# Patient Record
Sex: Female | Born: 1935
Health system: Southern US, Community
[De-identification: ages and names within clinical notes are randomized; demographics above are authoritative.]

## PROBLEM LIST (undated history)

## (undated) DIAGNOSIS — G4730 Sleep apnea, unspecified: Secondary | ICD-10-CM

## (undated) DIAGNOSIS — G2581 Restless legs syndrome: Secondary | ICD-10-CM

## (undated) DIAGNOSIS — Z8614 Personal history of Methicillin resistant Staphylococcus aureus infection: Secondary | ICD-10-CM

## (undated) DIAGNOSIS — F329 Major depressive disorder, single episode, unspecified: Secondary | ICD-10-CM

## (undated) DIAGNOSIS — I10 Essential (primary) hypertension: Secondary | ICD-10-CM

## (undated) DIAGNOSIS — K589 Irritable bowel syndrome without diarrhea: Secondary | ICD-10-CM

## (undated) DIAGNOSIS — E039 Hypothyroidism, unspecified: Secondary | ICD-10-CM

## (undated) DIAGNOSIS — B029 Zoster without complications: Secondary | ICD-10-CM

## (undated) DIAGNOSIS — M810 Age-related osteoporosis without current pathological fracture: Secondary | ICD-10-CM

## (undated) DIAGNOSIS — E785 Hyperlipidemia, unspecified: Secondary | ICD-10-CM

## (undated) DIAGNOSIS — M542 Cervicalgia: Secondary | ICD-10-CM

## (undated) DIAGNOSIS — K649 Unspecified hemorrhoids: Secondary | ICD-10-CM

## (undated) DIAGNOSIS — N3941 Urge incontinence: Secondary | ICD-10-CM

## (undated) DIAGNOSIS — F32A Depression, unspecified: Secondary | ICD-10-CM

## (undated) DIAGNOSIS — K5190 Ulcerative colitis, unspecified, without complications: Secondary | ICD-10-CM

## (undated) DIAGNOSIS — M159 Polyosteoarthritis, unspecified: Secondary | ICD-10-CM

## (undated) HISTORY — DX: Ulcerative colitis, unspecified, without complications: K51.90

## (undated) HISTORY — DX: Major depressive disorder, single episode, unspecified: F32.9

## (undated) HISTORY — DX: Urge incontinence: N39.41

## (undated) HISTORY — DX: Sleep apnea, unspecified: G47.30

## (undated) HISTORY — DX: Hyperlipidemia, unspecified: E78.5

## (undated) HISTORY — DX: Zoster without complications: B02.9

## (undated) HISTORY — DX: Cervicalgia: M54.2

## (undated) HISTORY — DX: Essential (primary) hypertension: I10

## (undated) HISTORY — DX: Polyosteoarthritis, unspecified: M15.9

## (undated) HISTORY — PX: OTHER SURGICAL HISTORY: SHX169

## (undated) HISTORY — DX: Depression, unspecified: F32.A

## (undated) HISTORY — DX: Unspecified hemorrhoids: K64.9

## (undated) HISTORY — DX: Personal history of Methicillin resistant Staphylococcus aureus infection: Z86.14

## (undated) HISTORY — DX: Hypothyroidism, unspecified: E03.9

## (undated) HISTORY — DX: Age-related osteoporosis without current pathological fracture: M81.0

## (undated) HISTORY — DX: Irritable bowel syndrome, unspecified: K58.9

## (undated) HISTORY — DX: Restless legs syndrome: G25.81

---
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
## 1978-04-18 HISTORY — PX: ABDOMINAL HYSTERECTOMY: SHX81

## 1982-04-18 HISTORY — PX: OTHER SURGICAL HISTORY: SHX169

## 2004-08-08 ENCOUNTER — Emergency Department: Payer: Self-pay | Admitting: Emergency Medicine

## 2004-08-13 ENCOUNTER — Emergency Department: Payer: Self-pay | Admitting: Emergency Medicine

## 2005-04-28 ENCOUNTER — Ambulatory Visit: Payer: Self-pay | Admitting: Ophthalmology

## 2005-05-03 ENCOUNTER — Ambulatory Visit: Payer: Self-pay | Admitting: Ophthalmology

## 2005-11-29 ENCOUNTER — Ambulatory Visit: Payer: Self-pay | Admitting: Gastroenterology

## 2005-12-26 ENCOUNTER — Ambulatory Visit: Payer: Self-pay | Admitting: Gastroenterology

## 2006-07-28 ENCOUNTER — Ambulatory Visit: Payer: Self-pay | Admitting: Podiatry

## 2008-12-05 ENCOUNTER — Ambulatory Visit: Payer: Self-pay | Admitting: Otolaryngology

## 2009-07-24 ENCOUNTER — Encounter: Payer: Self-pay | Admitting: Otolaryngology

## 2009-08-16 ENCOUNTER — Encounter: Payer: Self-pay | Admitting: Otolaryngology

## 2012-04-18 HISTORY — PX: ESOPHAGOGASTRODUODENOSCOPY: SHX1529

## 2012-06-16 HISTORY — PX: OTHER SURGICAL HISTORY: SHX169

## 2012-06-28 ENCOUNTER — Ambulatory Visit: Payer: Self-pay | Admitting: Unknown Physician Specialty

## 2012-12-17 HISTORY — PX: OTHER SURGICAL HISTORY: SHX169

## 2013-01-09 ENCOUNTER — Ambulatory Visit: Payer: Self-pay | Admitting: Gastroenterology

## 2013-01-10 LAB — PATHOLOGY REPORT

## 2013-03-05 ENCOUNTER — Emergency Department: Payer: Self-pay | Admitting: Emergency Medicine

## 2013-03-05 LAB — COMPREHENSIVE METABOLIC PANEL
Bilirubin,Total: 0.5 mg/dL (ref 0.2–1.0)
Chloride: 106 mmol/L (ref 98–107)
Co2: 26 mmol/L (ref 21–32)
Creatinine: 0.85 mg/dL (ref 0.60–1.30)
EGFR (African American): >60
EGFR (Non-African Amer.): >60
Osmolality: 274 (ref 275–301)
Potassium: 3.9 mmol/L (ref 3.5–5.1)
Sodium: 137 mmol/L (ref 136–145)
Total Protein: 7 g/dL (ref 6.4–8.2)

## 2013-03-05 LAB — CBC
HCT: 38.5 % (ref 35.0–47.0)
HGB: 13 g/dL (ref 12.0–16.0)
MCV: 86 fL (ref 80–100)
Platelet: 162 10*3/uL (ref 150–440)
WBC: 5.9 10*3/uL (ref 3.6–11.0)

## 2013-03-05 LAB — URINALYSIS, COMPLETE
Bilirubin,UR: NEGATIVE
Blood: NEGATIVE
Glucose,UR: NEGATIVE mg/dL (ref 0–75)
Protein: NEGATIVE

## 2013-03-05 LAB — CK TOTAL AND CKMB (NOT AT ARMC): CK-MB: 1.7 ng/mL (ref 0.5–3.6)

## 2013-04-19 DIAGNOSIS — R262 Difficulty in walking, not elsewhere classified: Secondary | ICD-10-CM | POA: Diagnosis not present

## 2013-04-19 DIAGNOSIS — M6281 Muscle weakness (generalized): Secondary | ICD-10-CM | POA: Diagnosis not present

## 2013-06-27 DIAGNOSIS — B009 Herpesviral infection, unspecified: Secondary | ICD-10-CM | POA: Diagnosis not present

## 2013-06-27 DIAGNOSIS — Z85820 Personal history of malignant melanoma of skin: Secondary | ICD-10-CM | POA: Diagnosis not present

## 2013-06-27 DIAGNOSIS — D235 Other benign neoplasm of skin of trunk: Secondary | ICD-10-CM | POA: Diagnosis not present

## 2013-07-11 DIAGNOSIS — S8290XD Unspecified fracture of unspecified lower leg, subsequent encounter for closed fracture with routine healing: Secondary | ICD-10-CM | POA: Diagnosis not present

## 2013-08-02 DIAGNOSIS — H9319 Tinnitus, unspecified ear: Secondary | ICD-10-CM | POA: Diagnosis not present

## 2013-08-02 DIAGNOSIS — J3489 Other specified disorders of nose and nasal sinuses: Secondary | ICD-10-CM | POA: Diagnosis not present

## 2013-08-02 DIAGNOSIS — R42 Dizziness and giddiness: Secondary | ICD-10-CM | POA: Diagnosis not present

## 2013-09-26 DIAGNOSIS — N318 Other neuromuscular dysfunction of bladder: Secondary | ICD-10-CM | POA: Diagnosis not present

## 2013-09-26 DIAGNOSIS — R32 Unspecified urinary incontinence: Secondary | ICD-10-CM | POA: Diagnosis not present

## 2013-10-02 DIAGNOSIS — E785 Hyperlipidemia, unspecified: Secondary | ICD-10-CM | POA: Diagnosis not present

## 2013-10-02 DIAGNOSIS — I10 Essential (primary) hypertension: Secondary | ICD-10-CM | POA: Diagnosis not present

## 2013-10-02 DIAGNOSIS — R42 Dizziness and giddiness: Secondary | ICD-10-CM | POA: Diagnosis not present

## 2013-10-02 DIAGNOSIS — E039 Hypothyroidism, unspecified: Secondary | ICD-10-CM | POA: Diagnosis not present

## 2013-10-04 DIAGNOSIS — I10 Essential (primary) hypertension: Secondary | ICD-10-CM | POA: Diagnosis not present

## 2013-10-04 DIAGNOSIS — E785 Hyperlipidemia, unspecified: Secondary | ICD-10-CM | POA: Diagnosis not present

## 2013-10-04 DIAGNOSIS — E039 Hypothyroidism, unspecified: Secondary | ICD-10-CM | POA: Diagnosis not present

## 2013-12-26 DIAGNOSIS — Z85820 Personal history of malignant melanoma of skin: Secondary | ICD-10-CM | POA: Diagnosis not present

## 2013-12-26 DIAGNOSIS — D235 Other benign neoplasm of skin of trunk: Secondary | ICD-10-CM | POA: Diagnosis not present

## 2013-12-26 DIAGNOSIS — D1801 Hemangioma of skin and subcutaneous tissue: Secondary | ICD-10-CM | POA: Diagnosis not present

## 2014-01-23 DIAGNOSIS — G4733 Obstructive sleep apnea (adult) (pediatric): Secondary | ICD-10-CM | POA: Diagnosis not present

## 2014-01-23 DIAGNOSIS — I10 Essential (primary) hypertension: Secondary | ICD-10-CM | POA: Insufficient documentation

## 2014-01-23 DIAGNOSIS — E782 Mixed hyperlipidemia: Secondary | ICD-10-CM | POA: Insufficient documentation

## 2014-01-23 DIAGNOSIS — G4730 Sleep apnea, unspecified: Secondary | ICD-10-CM | POA: Insufficient documentation

## 2014-01-29 DIAGNOSIS — H5509 Other forms of nystagmus: Secondary | ICD-10-CM | POA: Diagnosis not present

## 2014-02-06 DIAGNOSIS — Z23 Encounter for immunization: Secondary | ICD-10-CM | POA: Diagnosis not present

## 2014-04-02 DIAGNOSIS — R300 Dysuria: Secondary | ICD-10-CM | POA: Diagnosis not present

## 2014-04-02 DIAGNOSIS — E039 Hypothyroidism, unspecified: Secondary | ICD-10-CM | POA: Diagnosis not present

## 2014-04-02 DIAGNOSIS — I10 Essential (primary) hypertension: Secondary | ICD-10-CM | POA: Diagnosis not present

## 2014-04-02 DIAGNOSIS — E785 Hyperlipidemia, unspecified: Secondary | ICD-10-CM | POA: Diagnosis not present

## 2014-04-02 DIAGNOSIS — G2581 Restless legs syndrome: Secondary | ICD-10-CM | POA: Diagnosis not present

## 2014-04-28 DIAGNOSIS — M25551 Pain in right hip: Secondary | ICD-10-CM | POA: Diagnosis not present

## 2014-04-30 DIAGNOSIS — I10 Essential (primary) hypertension: Secondary | ICD-10-CM | POA: Diagnosis not present

## 2014-04-30 DIAGNOSIS — M25551 Pain in right hip: Secondary | ICD-10-CM | POA: Diagnosis not present

## 2014-05-02 DIAGNOSIS — M25551 Pain in right hip: Secondary | ICD-10-CM | POA: Diagnosis not present

## 2014-05-05 DIAGNOSIS — M25551 Pain in right hip: Secondary | ICD-10-CM | POA: Diagnosis not present

## 2014-05-07 DIAGNOSIS — M25551 Pain in right hip: Secondary | ICD-10-CM | POA: Diagnosis not present

## 2014-05-08 DIAGNOSIS — M25551 Pain in right hip: Secondary | ICD-10-CM | POA: Diagnosis not present

## 2014-05-12 DIAGNOSIS — M25551 Pain in right hip: Secondary | ICD-10-CM | POA: Diagnosis not present

## 2014-05-14 DIAGNOSIS — M25551 Pain in right hip: Secondary | ICD-10-CM | POA: Diagnosis not present

## 2014-05-16 DIAGNOSIS — M25551 Pain in right hip: Secondary | ICD-10-CM | POA: Diagnosis not present

## 2014-05-22 DIAGNOSIS — M25551 Pain in right hip: Secondary | ICD-10-CM | POA: Diagnosis not present

## 2014-05-27 DIAGNOSIS — N3941 Urge incontinence: Secondary | ICD-10-CM | POA: Diagnosis not present

## 2014-05-27 DIAGNOSIS — N3281 Overactive bladder: Secondary | ICD-10-CM | POA: Diagnosis not present

## 2014-05-28 DIAGNOSIS — M25551 Pain in right hip: Secondary | ICD-10-CM | POA: Diagnosis not present

## 2014-05-29 DIAGNOSIS — M25551 Pain in right hip: Secondary | ICD-10-CM | POA: Diagnosis not present

## 2014-05-30 DIAGNOSIS — N3941 Urge incontinence: Secondary | ICD-10-CM | POA: Diagnosis not present

## 2014-06-13 DIAGNOSIS — N3941 Urge incontinence: Secondary | ICD-10-CM | POA: Diagnosis not present

## 2014-06-27 DIAGNOSIS — N3941 Urge incontinence: Secondary | ICD-10-CM | POA: Diagnosis not present

## 2014-07-09 DIAGNOSIS — N3941 Urge incontinence: Secondary | ICD-10-CM | POA: Diagnosis not present

## 2014-07-09 DIAGNOSIS — I10 Essential (primary) hypertension: Secondary | ICD-10-CM | POA: Diagnosis not present

## 2014-07-16 DIAGNOSIS — N3941 Urge incontinence: Secondary | ICD-10-CM | POA: Diagnosis not present

## 2014-07-16 DIAGNOSIS — I10 Essential (primary) hypertension: Secondary | ICD-10-CM | POA: Diagnosis not present

## 2014-07-23 DIAGNOSIS — N3941 Urge incontinence: Secondary | ICD-10-CM | POA: Diagnosis not present

## 2014-07-30 DIAGNOSIS — Z85820 Personal history of malignant melanoma of skin: Secondary | ICD-10-CM | POA: Diagnosis not present

## 2014-07-30 DIAGNOSIS — N3941 Urge incontinence: Secondary | ICD-10-CM | POA: Diagnosis not present

## 2014-07-30 DIAGNOSIS — D2262 Melanocytic nevi of left upper limb, including shoulder: Secondary | ICD-10-CM | POA: Diagnosis not present

## 2014-07-30 DIAGNOSIS — D225 Melanocytic nevi of trunk: Secondary | ICD-10-CM | POA: Diagnosis not present

## 2014-07-30 DIAGNOSIS — D2272 Melanocytic nevi of left lower limb, including hip: Secondary | ICD-10-CM | POA: Diagnosis not present

## 2014-08-06 DIAGNOSIS — N3941 Urge incontinence: Secondary | ICD-10-CM | POA: Diagnosis not present

## 2014-08-12 DIAGNOSIS — Z1231 Encounter for screening mammogram for malignant neoplasm of breast: Secondary | ICD-10-CM | POA: Diagnosis not present

## 2014-08-13 DIAGNOSIS — I10 Essential (primary) hypertension: Secondary | ICD-10-CM | POA: Diagnosis not present

## 2014-08-13 DIAGNOSIS — N3941 Urge incontinence: Secondary | ICD-10-CM | POA: Diagnosis not present

## 2014-08-19 DIAGNOSIS — I10 Essential (primary) hypertension: Secondary | ICD-10-CM | POA: Diagnosis not present

## 2014-08-19 DIAGNOSIS — N3941 Urge incontinence: Secondary | ICD-10-CM | POA: Diagnosis not present

## 2014-08-26 DIAGNOSIS — M25551 Pain in right hip: Secondary | ICD-10-CM | POA: Diagnosis not present

## 2014-08-26 DIAGNOSIS — M545 Low back pain, unspecified: Secondary | ICD-10-CM | POA: Insufficient documentation

## 2014-08-26 DIAGNOSIS — M25559 Pain in unspecified hip: Secondary | ICD-10-CM | POA: Insufficient documentation

## 2014-08-27 DIAGNOSIS — N3941 Urge incontinence: Secondary | ICD-10-CM | POA: Diagnosis not present

## 2014-08-27 DIAGNOSIS — I10 Essential (primary) hypertension: Secondary | ICD-10-CM | POA: Diagnosis not present

## 2014-09-05 DIAGNOSIS — R399 Unspecified symptoms and signs involving the genitourinary system: Secondary | ICD-10-CM | POA: Diagnosis not present

## 2014-09-10 ENCOUNTER — Other Ambulatory Visit: Payer: Self-pay | Admitting: Urology

## 2014-09-10 ENCOUNTER — Ambulatory Visit
Admission: RE | Admit: 2014-09-10 | Discharge: 2014-09-10 | Disposition: A | Discharge status: Home / Self Care | Payer: Medicare Other | Source: Ambulatory Visit | Attending: Urology | Admitting: Urology

## 2014-09-10 DIAGNOSIS — N200 Calculus of kidney: Secondary | ICD-10-CM

## 2014-09-10 DIAGNOSIS — R935 Abnormal findings on diagnostic imaging of other abdominal regions, including retroperitoneum: Secondary | ICD-10-CM | POA: Diagnosis not present

## 2014-09-16 ENCOUNTER — Other Ambulatory Visit: Payer: Self-pay | Admitting: Urology

## 2014-09-16 ENCOUNTER — Telehealth: Payer: Self-pay | Admitting: Family Medicine

## 2014-09-16 DIAGNOSIS — N3941 Urge incontinence: Secondary | ICD-10-CM

## 2014-09-16 DIAGNOSIS — N390 Urinary tract infection, site not specified: Secondary | ICD-10-CM

## 2014-09-16 NOTE — Telephone Encounter (Signed)
Pt called and is wanting Romona to call her back

## 2014-09-17 ENCOUNTER — Ambulatory Visit: Payer: Self-pay | Admitting: Urology

## 2014-09-17 NOTE — Telephone Encounter (Signed)
Spoke with patient and I had already spoke to her about this matter in allscripts.

## 2014-09-19 ENCOUNTER — Ambulatory Visit: Payer: Medicare Other | Attending: Urology

## 2014-09-23 ENCOUNTER — Encounter: Payer: Self-pay | Admitting: *Deleted

## 2014-09-24 ENCOUNTER — Ambulatory Visit (INDEPENDENT_AMBULATORY_CARE_PROVIDER_SITE_OTHER): Payer: Medicare Other | Admitting: Urology

## 2014-09-24 ENCOUNTER — Encounter: Payer: Self-pay | Admitting: Urology

## 2014-09-24 VITALS — BP 167/89 | HR 70 | Ht 62.0 in | Wt 160.0 lb

## 2014-09-24 DIAGNOSIS — N390 Urinary tract infection, site not specified: Secondary | ICD-10-CM

## 2014-09-24 DIAGNOSIS — N3941 Urge incontinence: Secondary | ICD-10-CM

## 2014-09-24 DIAGNOSIS — R3129 Other microscopic hematuria: Secondary | ICD-10-CM

## 2014-09-24 DIAGNOSIS — R312 Other microscopic hematuria: Secondary | ICD-10-CM

## 2014-09-24 LAB — URINALYSIS, COMPLETE
Bilirubin, UA: POSITIVE — AB
GLUCOSE, UA: NEGATIVE
KETONES UA: NEGATIVE
LEUKOCYTES UA: NEGATIVE
Nitrite, UA: NEGATIVE
PH UA: 7 (ref 5.0–7.5)
Protein, UA: NEGATIVE
RBC, UA: NEGATIVE
SPEC GRAV UA: 1.01 (ref 1.005–1.030)
UUROB: 0.2 mg/dL (ref 0.2–1.0)

## 2014-09-24 LAB — MICROSCOPIC EXAMINATION: BACTERIA UA: NONE SEEN

## 2014-09-24 NOTE — Progress Notes (Signed)
PTNS  Session # 10 Health & Social Factors: No change Caffeine: 2 Alcohol: 0 Daytime voids #per day: 5 Night-time voids #per night: 0-1 Urgency: Strong Incontinence Episodes #per day: 1 Ankle used: Right Treatment Setting: 4 Feeling/ Response: Sensory   Preformed By: Zara Council PA-C Assistant: Lyndee Hensen CMA   Follow Up: One week

## 2014-09-26 ENCOUNTER — Telehealth: Payer: Self-pay

## 2014-09-26 LAB — CULTURE, URINE COMPREHENSIVE

## 2014-09-26 NOTE — Telephone Encounter (Signed)
-----   Message from Nori Riis, PA-C sent at 09/26/2014 12:14 PM EDT ----- Patient's UCx is negative.

## 2014-09-26 NOTE — Telephone Encounter (Signed)
Spoke with pt and made aware of negative urine cx. Pt voiced understanding. Cw,lpn

## 2014-09-28 DIAGNOSIS — N3941 Urge incontinence: Secondary | ICD-10-CM | POA: Insufficient documentation

## 2014-09-28 DIAGNOSIS — M810 Age-related osteoporosis without current pathological fracture: Secondary | ICD-10-CM | POA: Insufficient documentation

## 2014-09-28 DIAGNOSIS — N390 Urinary tract infection, site not specified: Secondary | ICD-10-CM | POA: Insufficient documentation

## 2014-09-28 DIAGNOSIS — R3129 Other microscopic hematuria: Secondary | ICD-10-CM | POA: Insufficient documentation

## 2014-09-28 DIAGNOSIS — M542 Cervicalgia: Secondary | ICD-10-CM | POA: Insufficient documentation

## 2014-09-28 DIAGNOSIS — M159 Polyosteoarthritis, unspecified: Secondary | ICD-10-CM | POA: Insufficient documentation

## 2014-09-28 DIAGNOSIS — E039 Hypothyroidism, unspecified: Secondary | ICD-10-CM | POA: Insufficient documentation

## 2014-09-28 NOTE — Progress Notes (Signed)
09/24/2014 7:16 AM   Kaitlyn Ramos [birth year] [id number]  Referring provider: Maryland Pink, MD [address] [facility] [place],  [address]  Chief Complaint  Patient presents with  . Urinary Incontinence    urge here for PTNS    HPI: Kaitlyn Ramos  is an 81 year old white female who suffers with urge incontinence who presents today for PTNS treatment. This is her 10th treatment.  Patient has been suffering with urge incontinence for last few years. She does not recall a specific event that set off the incontinence.  She experiences urinary leakage when she feels the urge to urinate. This can occur at any time. She says the leakage amount is variable. She is currently experiencing 1 leakage episode a day. She is experiencing a daytime frequency of 5 trips to the restroom daily.  She admits to nocturia once nightly. She does not have pain or burning with urination. She does not have a history of recurrent urinary tract infections. She did have a positive urine culture on 09/05/2014 for Pseudomonas. She was prescribed Cipro. We will repeat her UA and urine culture today. She has a strong urge to urinate. She uses poise pads and finding them dry more often. She does not consume alcohol and she drinks 2 caffeinated beverages daily. She has tried fluid restrictions and Kegel exercises without improvement of her urinary symptoms when she presented to [place] in February, she had a trial of Myrbetriq which she found ineffective. She was then given a trial with Vesicare 5 mg daily. She experienced good control of her urinary symptoms, but she experienced some dry mouth and urinary retention. She does not have any contraindications presents for PTNS, such as: Pacemaker, implantable defibrillator, history of abnormal bleeding, history of neuropathy or nerve damage or pregnancy.  We discussed the possible complication of the procedure, such as: Discomfort, bleeding at the  insertion/stimulation site and procedure consent is signed. Her goals for therapy are reduced urgency and no accidents. PTNS treatment:  The patient's right ankle is cleansed with rubbing alcohol. The needle electrode was inserted into the lower, inner aspect of patient's leg. A surface electrode was placed on the inside arch of the foot on the treatment leg. The lead set was connected to the stimulator and the needle electrode clip was connected to the needle electrode. The stimulator that produces an adjustable electrical pulse that travels to sacral nerve plexus via the tibial and nerve was increased to 4 and patient received sensory response to the stimulus. She tolerated the procedure well for 30 minutes.   BP 167/89 mmHg  Pulse 70  Ht 5\' 2"  (1.575 m)  Wt 160 lb (72.576 kg)  BMI 29.26 kg/m2                  PMH: Past Medical History  Diagnosis Date  . Essential hypertension   . Senile osteoporosis   . Restless leg   . IBS (irritable bowel syndrome)   . Ulcerative colitis   . Hemorrhoids   . Shingles   . History of MRSA infection   . Hyperlipidemia   . Osteoarthritis of multiple joints   . Depression   . Sleep apnea   . Hypothyroidism   . Cervicalgia   . Urge incontinence   . Hypertension     Surgical History: Past Surgical History  Procedure Laterality Date  . Cataract surgery    . Esophagogastroduodenoscopy  2014  . Parotid gland removal  1984  .  Abdominal hysterectomy  1980  . Cholecystectomy  1955  . Makoplasty Left 06/2012  . Colon polyp removal  12/2012    Home Medications:    Medication List       This list is accurate as of: 09/24/14 11:59 PM.  Always use your most recent med list.               amLODipine 2.5 MG tablet  Commonly known as:  NORVASC  Take 2.5 mg by mouth daily.     amoxicillin 500 MG capsule  Commonly known as:  AMOXIL     calcium carbonate 500 MG chewable tablet  Commonly known as:  TUMS - dosed in mg elemental  calcium  Chew 1 tablet by mouth daily.     ciprofloxacin 500 MG tablet  Commonly known as:  CIPRO     cyanocobalamin 1000 MCG tablet  Take 1,000 mcg by mouth daily.     DULoxetine 30 MG capsule  Commonly known as:  CYMBALTA  Take 30 mg by mouth daily.     etodolac 400 MG tablet  Commonly known as:  LODINE  Take 400 mg by mouth 2 (two) times daily.     levothyroxine 50 MCG tablet  Commonly known as:  SYNTHROID, LEVOTHROID  Take 50 mcg by mouth daily before breakfast.     MULTIVITAMIN ADULT PO  Take by mouth daily.     omeprazole 20 MG capsule  Commonly known as:  PRILOSEC  Take 20 mg by mouth daily.     rOPINIRole 0.25 MG tablet  Commonly known as:  REQUIP  Take 0.5 mg by mouth at bedtime.     simvastatin 20 MG tablet  Commonly known as:  ZOCOR  Take 20 mg by mouth daily.     VESICARE 5 MG tablet  Generic drug:  solifenacin  Take 5 mg by mouth daily.        Allergies:  Allergies  Allergen Reactions  . Codeine Sulfate Hives and Itching    Family History: Family History  Problem Relation Age of Onset  . Breast cancer Daughter   . Colon cancer Maternal Grandfather   . Hypertension Father   . Arthritis Mother   . Dementia Father   . Osteoporosis Mother     Social History:  reports that she has quit smoking. She does not have any smokeless tobacco history on file. She reports that she drinks alcohol. She reports that she does not use illicit drugs.  ROS: Urological Symptom Review  Patient is experiencing the following symptoms: Hard to postpone urination Get up at night to urinate   Review of Systems  Gastrointestinal (upper)  : Negative for upper GI symptoms  Gastrointestinal (lower) : Negative for lower GI symptoms  Constitutional : Fatigue  Skin: Negative for skin symptoms  Eyes: Negative for eye symptoms  Ear/Nose/Throat : Sinus problems  Hematologic/Lymphatic: Negative for Hematologic/Lymphatic symptoms  Cardiovascular  : Negative for cardiovascular symptoms  Respiratory : Negative for respiratory symptoms  Endocrine: Negative for endocrine symptoms  Musculoskeletal: Back pain Joint pain  Neurological: Dizziness  Psychologic: Negative for psychiatric symptoms   Physical Exam: BP 167/89 mmHg  Pulse 70  Ht 5\' 2"  (1.575 m)  Wt 160 lb (72.576 kg)  BMI 29.26 kg/m2   Results for orders placed or performed in visit on 09/24/14  CULTURE, URINE COMPREHENSIVE  Result Value Ref Range   Urine Culture, Comprehensive Final report    Result 1 Comment   Microscopic Examination  Result Value Ref Range   WBC, UA 0-5 0 -  5 /hpf   RBC, UA 3-10 (A) 0 -  2 /hpf   Epithelial Cells (non renal) 0-10 0 - 10 /hpf   Bacteria, UA None seen None seen/Few  Urinalysis, Complete  Result Value Ref Range   Specific Gravity, UA 1.010 1.005 - 1.030   pH, UA 7.0 5.0 - 7.5   Color, UA Yellow Yellow   Appearance Ur Clear Clear   Leukocytes, UA Negative Negative   Protein, UA Negative Negative/Trace   Glucose, UA Negative Negative   Ketones, UA Negative Negative   RBC, UA Negative Negative   Bilirubin, UA Positive (A) Negative   Urobilinogen, Ur 0.2 0.2 - 1.0 mg/dL   Nitrite, UA Negative Negative   Microscopic Examination See below:    Laboratory Data: Lab Results  Component Value Date   WBC 5.9 03/05/2013   HGB 13.0 03/05/2013   HCT 38.5 03/05/2013   MCV 86 03/05/2013   PLT 162 03/05/2013    Lab Results  Component Value Date   CREATININE 0.85 03/05/2013    No results found for: PSA  No results found for: TESTOSTERONE  No results found for: HGBA1C  Urinalysis    Component Value Date/Time   GLUCOSEU Negative 09/24/2014 1143   BILIRUBINUR Positive* 09/24/2014 1143   NITRITE Negative 09/24/2014 1143   LEUKOCYTESUR Negative 09/24/2014 1143    Pertinent Imaging:  Assessment & Plan:    1. Urge incontinence-  PTNS treatment:  The patient's left ankle is cleansed with rubbing alcohol. The  needle electrode was inserted into the lower, inner aspect of patient's leg. A surface electrode was placed on the inside arch of the foot on the treatment leg. The lead set was connected to the stimulator and the needle electrode clip was connected to the needle electrode. The stimulator that produces an adjustable electrical pulse that travels to sacral nerve plexus via the tibial and nerve was increased to 13 and patient received a toe flex and sensory response to the stimulus. She tolerated the procedure well for 30 minutes.  She will RTC in one week for her 11th PTNS  - Urinalysis, Complete - CULTURE, URINE COMPREHENSIVE  2. UTI- +UCx for Kleb on 09/05/2014.  She has completed her Cipro.  UA is repeated today and urine is sent for culture.  3. Microscopic hematuria-  Patient has 3-10 RBC's/hpf on today's UA.  We will repeat the UA after UCx are available and she is treated appropriately.     Return in about 1 week (around 10/01/2014) for PTNS and UA.  Zara Council, [facility] [address], [place], [place] [address] [phone]

## 2014-09-30 ENCOUNTER — Other Ambulatory Visit: Payer: Self-pay | Admitting: Urology

## 2014-09-30 DIAGNOSIS — R3129 Other microscopic hematuria: Secondary | ICD-10-CM

## 2014-10-01 ENCOUNTER — Telehealth: Payer: Self-pay | Admitting: Urology

## 2014-10-01 ENCOUNTER — Encounter: Payer: Self-pay | Admitting: Urology

## 2014-10-01 ENCOUNTER — Ambulatory Visit (INDEPENDENT_AMBULATORY_CARE_PROVIDER_SITE_OTHER): Payer: Medicare Other | Admitting: Urology

## 2014-10-01 VITALS — BP 142/81 | HR 66 | Ht 62.0 in | Wt 157.1 lb

## 2014-10-01 DIAGNOSIS — R312 Other microscopic hematuria: Secondary | ICD-10-CM

## 2014-10-01 DIAGNOSIS — R3129 Other microscopic hematuria: Secondary | ICD-10-CM

## 2014-10-01 DIAGNOSIS — N3941 Urge incontinence: Secondary | ICD-10-CM

## 2014-10-01 DIAGNOSIS — R822 Biliuria: Secondary | ICD-10-CM

## 2014-10-01 LAB — URINALYSIS, COMPLETE
Bilirubin, UA: POSITIVE — AB
Glucose, UA: NEGATIVE
KETONES UA: NEGATIVE
LEUKOCYTES UA: NEGATIVE
Nitrite, UA: NEGATIVE
Protein, UA: NEGATIVE
RBC UA: NEGATIVE
Specific Gravity, UA: 1.01 (ref 1.005–1.030)
Urobilinogen, Ur: 0.2 mg/dL (ref 0.2–1.0)
pH, UA: 6.5 (ref 5.0–7.5)

## 2014-10-01 LAB — MICROSCOPIC EXAMINATION: Bacteria, UA: NONE SEEN

## 2014-10-01 NOTE — Telephone Encounter (Signed)
On the encounter form for Kaitlyn Ramos, [place] notated she's to return in one week but her schedule is completely full. I wanted to check with you to see if [name] absolutely needs to return in a week or if her follow-up appt can be pushed out longer. Please advise. Thank you.

## 2014-10-01 NOTE — Telephone Encounter (Signed)
Spoke with Larene Beach will work Patient in at 11:00 next Wednesday. LMOM on her machine if she has any questions call office back

## 2014-10-01 NOTE — Telephone Encounter (Signed)
-----   Message from Nori Riis, PA-C sent at 10/01/2014 12:32 PM EDT ----- She has + bilirubin again.  What was the Ictotest result?

## 2014-10-01 NOTE — Telephone Encounter (Signed)
Results were given to [facility]. Cw,lpn

## 2014-10-01 NOTE — Progress Notes (Signed)
PTNS  Session # 11  Health & Social Factors: No change Caffeine: 2 Alcohol: 0 Daytime voids #per day: 4 Night-time voids #per night: 0-1 Urgency: Mild  Incontinence Episodes #per day: 0  Ankle used: Right  Treatment Setting: 3 Feeling/ Response: Sensory   Preformed By: Zara Council PA-C  Assistant: Lyndee Hensen CMA  Follow Up: One Week

## 2014-10-04 DIAGNOSIS — R822 Biliuria: Secondary | ICD-10-CM | POA: Insufficient documentation

## 2014-10-04 NOTE — Progress Notes (Signed)
10:34 PM   NELISSA BOLDUC 1927/07/20 [id number]  Referring provider: Maryland Pink, MD [address] [facility] [place], [place] [address]  Chief Complaint  Patient presents with  . Follow-up    on UTI infection x 1 wk ago  . Procedure    PTNS     HPI: Kaitlyn Ramos  is an 81 year old white female who suffers with urge incontinence who presents today for PTNS treatment. This is her 11th treatment.  Patient has been suffering with urge incontinence for last few years. She does not recall a specific event that set off the incontinence.  She has been experiencing a decrease in her incontinence and urgency since starting the PTNS. She is currently experiencing 0 leakage episode a day. She is experiencing a daytime frequency of 4 trips to the restroom daily.  She admits to nocturia once nightly. She does not have pain or burning with urination.  She did have a positive urine culture on 09/05/2014 for Pseudomonas. She was prescribed Cipro. Her repeat UCx was negative.   She has a strong urge to urinate. She uses poise pads and finding them dry more often. She does not consume alcohol and she drinks 2 caffeinated beverages daily. She has tried fluid restrictions and Kegel exercises without improvement of her urinary symptoms when she presented to [place] in February, she had a trial of Myrbetriq which she found ineffective. She was then given a trial with Vesicare 5 mg daily. She experienced good control of her urinary symptoms, but she experienced some dry mouth and urinary retention. She does not have any contraindications presents for PTNS, such as: Pacemaker, implantable defibrillator, history of abnormal bleeding, history of neuropathy or nerve damage or pregnancy.  We discussed the possible complication of the procedure, such as: Discomfort, bleeding at the insertion/stimulation site and procedure consent is signed. Her goals for therapy are reduced urgency and no accidents. PTNS  treatment:  The patient's right ankle is cleansed with rubbing alcohol. The needle electrode was inserted into the lower, inner aspect of patient's leg. A surface electrode was placed on the inside arch of the foot on the treatment leg. The lead set was connected to the stimulator and the needle electrode clip was connected to the needle electrode. The stimulator that produces an adjustable electrical pulse that travels to sacral nerve plexus via the tibial and nerve was increased to 3 and patient received sensory response to the stimulus. She tolerated the procedure well for 30 minutes.   BP 142/81 mmHg  Pulse 66  Ht 5\' 2"  (1.575 m)  Wt 157 lb 1.6 oz (71.26 kg)  BMI 28.73 kg/m2  Results for orders placed or performed in visit on 10/01/14  Microscopic Examination  Result Value Ref Range   WBC, UA 0-5 0 -  5 /hpf   RBC, UA 0-2 0 -  2 /hpf   Epithelial Cells (non renal) 0-10 0 - 10 /hpf   Bacteria, UA None seen None seen/Few  Urinalysis, Complete  Result Value Ref Range   Specific Gravity, UA 1.010 1.005 - 1.030   pH, UA 6.5 5.0 - 7.5   Color, UA Yellow Yellow   Appearance Ur Clear Clear   Leukocytes, UA Negative Negative   Protein, UA Negative Negative/Trace   Glucose, UA Negative Negative   Ketones, UA Negative Negative   RBC, UA Negative Negative   Bilirubin, UA Positive (A) Negative   Urobilinogen, Ur 0.2 0.2 - 1.0 mg/dL   Nitrite,  UA Negative Negative   Microscopic Examination See below:                 Assessment & Plan:    1. Urge incontinence-  Patient feels that she is responding well to the PTNS treatment.  She will RTC in one week for her 12th PTNS  - Urinalysis, Complete  2. Microscopic hematuria-  Patient had 3-10 RBC's/hpf on last week's  UA.  UCx was negative.  She did not have microheme on today's UA.    3. Bilirubin in the urine-  We will check LFT's at her return visit in one week.     No Follow-up on file.  Zara Council, [facility] [address], [name], [place] [address] [phone]

## 2014-10-08 ENCOUNTER — Ambulatory Visit (INDEPENDENT_AMBULATORY_CARE_PROVIDER_SITE_OTHER): Payer: Medicare Other | Admitting: Urology

## 2014-10-08 ENCOUNTER — Encounter: Payer: Self-pay | Admitting: Urology

## 2014-10-08 VITALS — BP 167/78 | HR 73 | Ht 62.0 in | Wt 159.7 lb

## 2014-10-08 DIAGNOSIS — R822 Biliuria: Secondary | ICD-10-CM | POA: Diagnosis not present

## 2014-10-08 DIAGNOSIS — N3941 Urge incontinence: Secondary | ICD-10-CM | POA: Diagnosis not present

## 2014-10-08 DIAGNOSIS — G4730 Sleep apnea, unspecified: Secondary | ICD-10-CM

## 2014-10-08 NOTE — Progress Notes (Signed)
PTNS  Session # 12  Health & Social Factors: No Change Caffeine: 2 Alcohol: 0 Daytime voids #per day: 4 Night-time voids #per night: 0-1 Urgency: Mild Incontinence Episodes #per day: 0 Ankle used: Left Treatment Setting: 1 Feeling/ Response: Both  Preformed By: Zara Council PA-C  Assistant: Lyndee Hensen CMA Follow Up: One month

## 2014-10-12 ENCOUNTER — Telehealth: Payer: Self-pay | Admitting: Urology

## 2014-10-12 DIAGNOSIS — R17 Unspecified jaundice: Secondary | ICD-10-CM

## 2014-10-12 NOTE — Telephone Encounter (Signed)
Patient had bilirubin in her urine and I forgot to check her LFT's at her last visit.  Would she be willing to come in and have her blood drawn?

## 2014-10-12 NOTE — Progress Notes (Signed)
9:09 PM   Kaitlyn Ramos 10-13-1927 [id number]  Referring provider: Maryland Pink, MD [address] [place], [place] [address]  Chief Complaint  Patient presents with  . Urge Incontinence    PTNS    HPI: Kaitlyn Ramos  is an 81 year old white female who suffers with urge incontinence who presents today for PTNS treatment. This is her 12th treatment.  Patient has been suffering with urge incontinence for last few years. She does not recall a specific event that set off the incontinence.  She has been experiencing a decrease in her incontinence and urgency since starting the PTNS. She is currently experiencing 0 leakage episode a day. She is experiencing a daytime frequency of 4 trips to the restroom daily.  She admits to nocturia once nightly. She does not have pain or burning with urination.  She did have a positive urine culture on 09/05/2014 for Pseudomonas. She was prescribed Cipro. Her repeat UCx was negative.   She has a strong urge to urinate. She uses poise pads and finding them dry more often. She does not consume alcohol and she drinks 2 caffeinated beverages daily. She has tried fluid restrictions and Kegel exercises without improvement of her urinary symptoms when she presented to [place] in February, she had a trial of Myrbetriq which she found ineffective. She was then given a trial with Vesicare 5 mg daily. She experienced good control of her urinary symptoms, but she experienced some dry mouth and urinary retention. She does not have any contraindications presents for PTNS, such as: Pacemaker, implantable defibrillator, history of abnormal bleeding, history of neuropathy or nerve damage or pregnancy.  We discussed the possible complication of the procedure, such as: Discomfort, bleeding at the insertion/stimulation site and procedure consent is signed. Her goals for therapy are reduced urgency and no accidents. PTNS treatment:  The patient's left ankle is cleansed with rubbing  alcohol. The needle electrode was inserted into the lower, inner aspect of patient's leg. A surface electrode was placed on the inside arch of the foot on the treatment leg. The lead set was connected to the stimulator and the needle electrode clip was connected to the needle electrode. The stimulator that produces an adjustable electrical pulse that travels to sacral nerve plexus via the tibial and nerve was increased to 2 and patient received both a toe flex and sensory response to the stimulus. She tolerated the procedure well for 30 minutes.   Blood pressure 167/78, pulse 73, height 5\' 2"  (1.575 m), weight 159 lb 11.2 oz (72.439 kg).  Results for orders placed or performed in visit on 10/01/14  Microscopic Examination  Result Value Ref Range   WBC, UA 0-5 0 -  5 /hpf   RBC, UA 0-2 0 -  2 /hpf   Epithelial Cells (non renal) 0-10 0 - 10 /hpf   Bacteria, UA None seen None seen/Few  Urinalysis, Complete  Result Value Ref Range   Specific Gravity, UA 1.010 1.005 - 1.030   pH, UA 6.5 5.0 - 7.5   Color, UA Yellow Yellow   Appearance Ur Clear Clear   Leukocytes, UA Negative Negative   Protein, UA Negative Negative/Trace   Glucose, UA Negative Negative   Ketones, UA Negative Negative   RBC, UA Negative Negative   Bilirubin, UA Positive (A) Negative   Urobilinogen, Ur 0.2 0.2 - 1.0 mg/dL   Nitrite, UA Negative Negative   Microscopic Examination See below:  Assessment & Plan:    1. Urge incontinence-  Patient feels that she is responding well to the PTNS treatment.  She will RTC in one week for her 12th PTNS  - Urinalysis, Complete  2. Bilirubin in the urine-  We will check LFT's.       No Follow-up on file.  Kaitlyn Ramos, [facility] [address], [place], [place] [address] [phone]

## 2014-10-13 ENCOUNTER — Other Ambulatory Visit: Payer: Medicare Other

## 2014-10-13 DIAGNOSIS — R822 Biliuria: Secondary | ICD-10-CM | POA: Diagnosis not present

## 2014-10-13 NOTE — Telephone Encounter (Signed)
Pt is coming in this afternoon. Orders have been put into epic. Cw,lpn

## 2014-10-14 ENCOUNTER — Telehealth: Payer: Self-pay

## 2014-10-14 LAB — HEPATIC FUNCTION PANEL
ALK PHOS: 56 IU/L (ref 39–117)
ALT: 18 IU/L (ref 0–32)
AST: 25 IU/L (ref 0–40)
Albumin: 4.5 g/dL (ref 3.5–4.7)
Bilirubin Total: 0.6 mg/dL (ref 0.0–1.2)
Bilirubin, Direct: 0.18 mg/dL (ref 0.00–0.40)
Total Protein: 7 g/dL (ref 6.0–8.5)

## 2014-10-14 NOTE — Telephone Encounter (Signed)
Made pt aware of lab results. Cw, lpn

## 2014-10-14 NOTE — Telephone Encounter (Signed)
-----   Message from Nori Riis, PA-C sent at 10/14/2014  8:32 AM EDT ----- LFT's are normal.

## 2014-10-24 ENCOUNTER — Telehealth: Payer: Self-pay | Admitting: Urology

## 2014-10-24 NOTE — Telephone Encounter (Signed)
LMOM for patient to return call. Spoke with Dr. Erlene Quan and she states after the PTNS she can stop the Vesicare to see if she is better, and she can restart the Vesicare if she finds out that her symptoms return.

## 2014-10-24 NOTE — Telephone Encounter (Signed)
Patient called wanting to know if she needed to continue taking Vesicare. Best contact number is [phone] 10/24/14 MAF

## 2014-10-24 NOTE — Telephone Encounter (Signed)
Pt wants to know if she needs to continue Vesicare?  [phone]

## 2014-10-27 NOTE — Telephone Encounter (Signed)
LMOM for patient to call back.

## 2014-10-27 NOTE — Telephone Encounter (Signed)
Spoke with Patient and let her know Dr. Erlene Quan states she can stop the vesicare. If symtoms return she can restart the med. Patient ok with plan.

## 2014-10-29 DIAGNOSIS — G2581 Restless legs syndrome: Secondary | ICD-10-CM | POA: Diagnosis not present

## 2014-10-29 DIAGNOSIS — I10 Essential (primary) hypertension: Secondary | ICD-10-CM | POA: Diagnosis not present

## 2014-10-29 DIAGNOSIS — E785 Hyperlipidemia, unspecified: Secondary | ICD-10-CM | POA: Diagnosis not present

## 2014-10-29 DIAGNOSIS — N3281 Overactive bladder: Secondary | ICD-10-CM | POA: Diagnosis not present

## 2014-11-04 DIAGNOSIS — I10 Essential (primary) hypertension: Secondary | ICD-10-CM | POA: Diagnosis not present

## 2014-11-04 DIAGNOSIS — E039 Hypothyroidism, unspecified: Secondary | ICD-10-CM | POA: Diagnosis not present

## 2014-11-04 DIAGNOSIS — E785 Hyperlipidemia, unspecified: Secondary | ICD-10-CM | POA: Diagnosis not present

## 2014-11-07 ENCOUNTER — Encounter: Payer: Self-pay | Admitting: Urology

## 2014-11-07 ENCOUNTER — Ambulatory Visit (INDEPENDENT_AMBULATORY_CARE_PROVIDER_SITE_OTHER): Payer: Medicare Other | Admitting: Urology

## 2014-11-07 VITALS — BP 169/93 | HR 67 | Ht 62.0 in | Wt 159.5 lb

## 2014-11-07 DIAGNOSIS — N3941 Urge incontinence: Secondary | ICD-10-CM | POA: Diagnosis not present

## 2014-11-07 LAB — PTNS-PERCUTANEOUS TIBIAL NERVE STIMULATION: Scan Result: 4

## 2014-11-07 NOTE — Progress Notes (Signed)
    3:39 PM   Kaitlyn Ramos February 16, 1928 [id number]  Referring provider: Maryland Pink, MD [address] [place], [place] [address]  Chief Complaint  Patient presents with  . Urinary Incontinence    PTNS    HPI: Kaitlyn Ramos  is an 81 year old white female who suffers with urge incontinence who presents today for PTNS treatment. This is a monthly maintained treatment.  Patient has been suffering with urge incontinence for last few years. She does not recall a specific event that set off the incontinence.  She has been experiencing a decrease in her incontinence and urgency since starting the PTNS.  She is currently experiencing 0 leakage episode a day. She is experiencing a daytime frequency of 5 trips to the restroom daily.  She admits to nocturia once nightly. She does not have pain or burning with urination. She has a strong urge to urinate. She uses poise pads and finding them dry more often. She does not consume alcohol and she drinks 2 caffeinated beverages daily.  She has tried fluid restrictions and Kegel exercises without improvement of her urinary symptoms when she presented to [place] in February, she had a trial of Myrbetriq which she found ineffective. She was then given a trial with Vesicare 5 mg daily. She experienced good control of her urinary symptoms, but she experienced some dry mouth and urinary retention.  She does not have any contraindications presents for PTNS, such as: Pacemaker, implantable defibrillator, history of abnormal bleeding, history of neuropathy or nerve damage or pregnancy.    We discussed the possible complication of the procedure, such as: Discomfort, bleeding at the insertion/stimulation site and procedure consent is signed.  Her goals for therapy are reduced urgency and no accidents.  PTNS treatment:  The patient's right ankle is cleansed with rubbing alcohol. The needle electrode was inserted into the lower, inner aspect of patient's leg. A  surface electrode was placed on the inside arch of the foot on the treatment leg. The lead set was connected to the stimulator and the needle electrode clip was connected to the needle electrode. The stimulator that produces an adjustable electrical pulse that travels to sacral nerve plexus via the tibial and nerve was increased to 4 and patient received both a sensory response to the stimulus. She tolerated the procedure well for 30 minutes.  Blood pressure 169/93, pulse 67, height 5\' 2"  (1.575 m), weight 159 lb 8 oz (72.349 kg).       Assessment & Plan:    1. Urge incontinence-  Patient feels that she is responding well to the PTNS treatment.  She will RTC in one month for her maintenance PTNS  No Follow-up on file.  Zara Council, [facility] [address], [place], [place] [address] [phone]

## 2014-11-25 DIAGNOSIS — M25511 Pain in right shoulder: Secondary | ICD-10-CM | POA: Diagnosis not present

## 2014-11-25 DIAGNOSIS — M7540 Impingement syndrome of unspecified shoulder: Secondary | ICD-10-CM | POA: Insufficient documentation

## 2014-11-25 DIAGNOSIS — G8929 Other chronic pain: Secondary | ICD-10-CM | POA: Diagnosis not present

## 2014-11-25 DIAGNOSIS — M25519 Pain in unspecified shoulder: Secondary | ICD-10-CM | POA: Insufficient documentation

## 2014-11-25 DIAGNOSIS — M7541 Impingement syndrome of right shoulder: Secondary | ICD-10-CM | POA: Diagnosis not present

## 2014-12-02 DIAGNOSIS — R42 Dizziness and giddiness: Secondary | ICD-10-CM | POA: Diagnosis not present

## 2014-12-02 DIAGNOSIS — H8110 Benign paroxysmal vertigo, unspecified ear: Secondary | ICD-10-CM | POA: Diagnosis not present

## 2014-12-11 ENCOUNTER — Encounter: Payer: Self-pay | Admitting: Urology

## 2014-12-11 ENCOUNTER — Ambulatory Visit (INDEPENDENT_AMBULATORY_CARE_PROVIDER_SITE_OTHER): Payer: Medicare Other | Admitting: Urology

## 2014-12-11 VITALS — BP 147/67 | HR 65 | Ht 62.0 in | Wt 160.2 lb

## 2014-12-11 DIAGNOSIS — N3941 Urge incontinence: Secondary | ICD-10-CM

## 2014-12-11 LAB — PTNS-PERCUTANEOUS TIBIAL NERVE STIMULATION: SCAN RESULT: 1

## 2014-12-11 NOTE — Progress Notes (Signed)
    12:11 PM   Kaitlyn Ramos [birth year] [id number]  Referring provider: Maryland Pink, MD [address] [place], [place] [address]  Chief Complaint  Patient presents with  . Urge Incontinence    PTNS    HPI: Kaitlyn Ramos  is an 81 year old white female who suffers with urge incontinence who presents today for PTNS treatment. This is a monthly maintained treatment.  Patient has been suffering with urge incontinence for last few years. She does not recall a specific event that set off the incontinence.  She has been experiencing a decrease in her incontinence and urgency since starting the PTNS.  She is currently experiencing 0 (unchanged) leakage episode a day. She is experiencing a daytime frequency of 4 (improved) trips to the restroom daily.  She admits to nocturia once nightly (unchanged). She does not have pain or burning with urination. She has a strong urge to urinate. She uses poise pads and finding them dry more often. She does not consume alcohol and she drinks 2 caffeinated beverages daily.  She has tried fluid restrictions and Kegel exercises without improvement of her urinary symptoms when she presented to [place] in February, she had a trial of Myrbetriq which she found ineffective. She was then given a trial with Vesicare 5 mg daily. She experienced good control of her urinary symptoms, but she experienced some dry mouth and urinary retention.  She has restarted the Vesicare.    She does not have any contraindications presents for PTNS, such as: Pacemaker, implantable defibrillator, history of abnormal bleeding, history of neuropathy or nerve damage or pregnancy.    We discussed the possible complication of the procedure, such as: Discomfort, bleeding at the insertion/stimulation site and procedure consent is signed.  Her goals for therapy are reduced urgency and no accidents.  PTNS treatment: The patient's left ankle is cleansed with rubbing alcohol. The needle electrode was  inserted into the lower, inner aspect of patient's leg. A surface electrode was placed on the inside arch of the foot on the treatment leg. The lead set was connected to the stimulator and the needle electrode clip was connected to the needle electrode. The stimulator that produces an adjustable electrical pulse that travels to sacral nerve plexus via the tibial and nerve was increased to 1 and patient received both a sensory response to the stimulus. She tolerated the procedure well for 30 minutes.  Blood pressure 147/67, pulse 65, height 5\' 2"  (1.575 m), weight 160 lb 3.2 oz (72.666 kg).       Assessment & Plan:    1. Urge incontinence-  Patient feels that she is responding well to the PTNS treatment.  She will RTC in one month for her maintenance PTNS  No Follow-up on file.  Zara Council, [facility] [address], [place], [place] [address] [phone]

## 2015-01-12 ENCOUNTER — Encounter: Payer: Self-pay | Admitting: Urology

## 2015-01-12 ENCOUNTER — Ambulatory Visit (INDEPENDENT_AMBULATORY_CARE_PROVIDER_SITE_OTHER): Payer: Medicare Other | Admitting: Urology

## 2015-01-12 VITALS — BP 158/97 | HR 72 | Ht 62.5 in | Wt 162.1 lb

## 2015-01-12 DIAGNOSIS — N3941 Urge incontinence: Secondary | ICD-10-CM | POA: Diagnosis not present

## 2015-01-12 LAB — PTNS-PERCUTANEOUS TIBIAL NERVE STIMULATION: SCAN RESULT: 4

## 2015-01-12 NOTE — Progress Notes (Signed)
    10:28 PM   NEHAL WITTING May 22, 1927 [id number]  Referring provider: Maryland Pink, MD [address] [place], [place] [address]  Chief Complaint  Patient presents with  . Urinary Incontinence    HPI: Kaitlyn Ramos  is an 81 year old white female who suffers with urge incontinence who presents today for PTNS treatment. This is a monthly maintained treatment.  Patient has been suffering with urge incontinence for last few years. She does not recall a specific event that set off the incontinence.  She has been experiencing a decrease in her incontinence and urgency since starting the PTNS.  After her last treatment,  she is currently experiencing 0 (unchanged) leakage episode a day. She is experiencing a daytime frequency of 5 (worsening) trips to the restroom daily.  She admits to nocturia once nightly (unchanged). She does not have pain or burning with urination. She has a mild urge to urinate. She uses poise pads and finding them dry more often. She does not consume alcohol and she drinks 2 caffeinated beverages daily.  She has tried fluid restrictions and Kegel exercises without improvement of her urinary symptoms when she presented to [place] in February, she had a trial of Myrbetriq which she found ineffective. She was then given a trial with Vesicare 5 mg daily. She experienced good control of her urinary symptoms, but she experienced some dry mouth and urinary retention.  She has restarted the Vesicare.    She does not have any contraindications presents for PTNS, such as: Pacemaker, implantable defibrillator, history of abnormal bleeding, history of neuropathy or nerve damage or pregnancy.    We discussed the possible complication of the procedure, such as: Discomfort, bleeding at the insertion/stimulation site and procedure consent is signed.  Her goals for therapy are reduced urgency and no accidents.  Blood pressure 158/97, pulse 72, height 5' 2.5" (1.588 m), weight 162 lb 1.6 oz  (73.528 kg).  PTNS treatment: The patient's right ankle is cleansed with rubbing alcohol. The needle electrode was inserted into the lower, inner aspect of patient's leg. A surface electrode was placed on the inside arch of the foot on the treatment leg. The lead set was connected to the stimulator and the needle electrode clip was connected to the needle electrode. The stimulator that produces an adjustable electrical pulse that travels to sacral nerve plexus via the tibial and nerve was increased to 4 and patient received both a sensory response to the stimulus. She tolerated the procedure well for 30 minutes.        Assessment & Plan:    1. Urge incontinence-  Patient feels that she is responding well to the PTNS treatment.  She will RTC in one month for her maintenance PTNS  Return in about 1 month (around 02/11/2015) for maintanence PTNS.  Zara Council, [facility] [address], [place], [place] [address] [phone]

## 2015-01-16 ENCOUNTER — Other Ambulatory Visit: Payer: Self-pay | Admitting: Unknown Physician Specialty

## 2015-01-19 ENCOUNTER — Other Ambulatory Visit: Payer: Self-pay | Admitting: Unknown Physician Specialty

## 2015-01-19 DIAGNOSIS — M25511 Pain in right shoulder: Principal | ICD-10-CM

## 2015-01-19 DIAGNOSIS — G8929 Other chronic pain: Secondary | ICD-10-CM

## 2015-01-22 DIAGNOSIS — I10 Essential (primary) hypertension: Secondary | ICD-10-CM | POA: Diagnosis not present

## 2015-01-22 DIAGNOSIS — E782 Mixed hyperlipidemia: Secondary | ICD-10-CM | POA: Diagnosis not present

## 2015-01-22 DIAGNOSIS — R0989 Other specified symptoms and signs involving the circulatory and respiratory systems: Secondary | ICD-10-CM | POA: Diagnosis not present

## 2015-01-22 DIAGNOSIS — G4733 Obstructive sleep apnea (adult) (pediatric): Secondary | ICD-10-CM | POA: Diagnosis not present

## 2015-01-24 ENCOUNTER — Ambulatory Visit
Admission: RE | Admit: 2015-01-24 | Discharge: 2015-01-24 | Disposition: A | Discharge status: Home / Self Care | Payer: Medicare Other | Source: Ambulatory Visit | Attending: Unknown Physician Specialty | Admitting: Unknown Physician Specialty

## 2015-01-24 DIAGNOSIS — G8929 Other chronic pain: Secondary | ICD-10-CM | POA: Diagnosis present

## 2015-01-24 DIAGNOSIS — S46811A Strain of other muscles, fascia and tendons at shoulder and upper arm level, right arm, initial encounter: Secondary | ICD-10-CM | POA: Insufficient documentation

## 2015-01-24 DIAGNOSIS — X58XXXA Exposure to other specified factors, initial encounter: Secondary | ICD-10-CM | POA: Insufficient documentation

## 2015-01-24 DIAGNOSIS — M25411 Effusion, right shoulder: Secondary | ICD-10-CM | POA: Diagnosis not present

## 2015-01-24 DIAGNOSIS — M25511 Pain in right shoulder: Secondary | ICD-10-CM

## 2015-02-10 DIAGNOSIS — M7541 Impingement syndrome of right shoulder: Secondary | ICD-10-CM | POA: Diagnosis not present

## 2015-02-10 DIAGNOSIS — M75121 Complete rotator cuff tear or rupture of right shoulder, not specified as traumatic: Secondary | ICD-10-CM | POA: Diagnosis not present

## 2015-02-12 DIAGNOSIS — Z23 Encounter for immunization: Secondary | ICD-10-CM | POA: Diagnosis not present

## 2015-02-26 ENCOUNTER — Ambulatory Visit (INDEPENDENT_AMBULATORY_CARE_PROVIDER_SITE_OTHER): Payer: Medicare Other | Admitting: Urology

## 2015-02-26 ENCOUNTER — Encounter: Payer: Self-pay | Admitting: Urology

## 2015-02-26 VITALS — BP 154/85 | HR 73 | Ht 62.0 in | Wt 163.1 lb

## 2015-02-26 DIAGNOSIS — N3941 Urge incontinence: Secondary | ICD-10-CM | POA: Diagnosis not present

## 2015-02-26 LAB — PTNS-PERCUTANEOUS TIBIAL NERVE STIMULATION: Scan Result: 4

## 2015-02-27 NOTE — Progress Notes (Signed)
    8:44 AM   [name] [birth year] [id number]  Referring provider: Maryland Pink, MD [address] [facility] [place], [place] [address]  Chief Complaint  Patient presents with  . Urge Incontinence    PTNS Maint 4    HPI: Kaitlyn Ramos  is an 81 year old white female who suffers with urge incontinence who presents today for PTNS treatment. This is a monthly maintained treatment.  Patient has been suffering with urge incontinence for last few years. She does not recall a specific event that set off the incontinence.  She has been experiencing a decrease in her incontinence and urgency since starting the PTNS.  After her last treatment,  she is currently experiencing 0 (unchanged) leakage episode a day. She is experiencing a daytime frequency of 4 (improved) trips to the restroom daily.  She admits to nocturia once nightly (unchanged). She does not have pain or burning with urination. She has a mild urge to urinate. She uses poise pads and finding them dry more often. She does not consume alcohol and she drinks 2 caffeinated beverages daily.  She has tried fluid restrictions and Kegel exercises without improvement of her urinary symptoms when she presented to [place] in February, she had a trial of Myrbetriq which she found ineffective. She was then given a trial with Vesicare 5 mg daily. She experienced good control of her urinary symptoms, but she experienced some dry mouth and urinary retention.  She has restarted the Vesicare.    She does not have any contraindications presents for PTNS, such as: Pacemaker, implantable defibrillator, history of abnormal bleeding, history of neuropathy or nerve damage or pregnancy.    We discussed the possible complication of the procedure, such as: Discomfort, bleeding at the insertion/stimulation site and procedure consent is signed.  Her goals for therapy are reduced urgency and no accidents.  Blood pressure 158/97, pulse 72, height 5' 2.5"  (1.588 m), weight 162 lb 1.6 oz (73.528 kg).  PTNS treatment: The patient's right ankle is cleansed with rubbing alcohol. The needle electrode was inserted into the lower, inner aspect of patient's right leg. A surface electrode was placed on the inside arch of the foot on the treatment leg. The lead set was connected to the stimulator and the needle electrode clip was connected to the needle electrode. The stimulator that produces an adjustable electrical pulse that travels to sacral nerve plexus via the tibial and nerve was increased to 4 and patient received a sensory response to the stimulus. She tolerated the procedure well for 30 minutes.        Assessment & Plan:    1. Urge incontinence-  Patient feels that she is responding well to the PTNS treatment.  She will RTC in one month for her maintenance PTNS  Return in about 1 month (around 03/28/2015) for Maintenance PTNS.  Zara Council, [facility] [address] [place], [place] [address] [phone]

## 2015-03-04 DIAGNOSIS — D225 Melanocytic nevi of trunk: Secondary | ICD-10-CM | POA: Diagnosis not present

## 2015-03-04 DIAGNOSIS — D2272 Melanocytic nevi of left lower limb, including hip: Secondary | ICD-10-CM | POA: Diagnosis not present

## 2015-03-04 DIAGNOSIS — Z85820 Personal history of malignant melanoma of skin: Secondary | ICD-10-CM | POA: Diagnosis not present

## 2015-03-04 DIAGNOSIS — L820 Inflamed seborrheic keratosis: Secondary | ICD-10-CM | POA: Diagnosis not present

## 2015-03-04 DIAGNOSIS — D485 Neoplasm of uncertain behavior of skin: Secondary | ICD-10-CM | POA: Diagnosis not present

## 2015-03-04 DIAGNOSIS — D2261 Melanocytic nevi of right upper limb, including shoulder: Secondary | ICD-10-CM | POA: Diagnosis not present

## 2015-03-26 ENCOUNTER — Encounter: Payer: Self-pay | Admitting: Urology

## 2015-03-26 ENCOUNTER — Ambulatory Visit (INDEPENDENT_AMBULATORY_CARE_PROVIDER_SITE_OTHER): Payer: Medicare Other | Admitting: Urology

## 2015-03-26 VITALS — BP 169/82 | HR 72 | Ht 60.0 in | Wt 163.1 lb

## 2015-03-26 DIAGNOSIS — M25511 Pain in right shoulder: Secondary | ICD-10-CM | POA: Diagnosis not present

## 2015-03-26 DIAGNOSIS — M6281 Muscle weakness (generalized): Secondary | ICD-10-CM | POA: Diagnosis not present

## 2015-03-26 DIAGNOSIS — N3941 Urge incontinence: Secondary | ICD-10-CM | POA: Diagnosis not present

## 2015-03-26 DIAGNOSIS — M25311 Other instability, right shoulder: Secondary | ICD-10-CM | POA: Diagnosis not present

## 2015-03-26 LAB — PTNS-PERCUTANEOUS TIBIAL NERVE STIMULATION: SCAN RESULT: 5

## 2015-03-26 NOTE — Progress Notes (Signed)
    3:41 PM   CHARLESTON ALFSON 1927-08-09 [id number]  Referring provider: Maryland Pink, MD [address] [facility] [place], [place] [address]  Chief Complaint  Patient presents with  . Urge incontinence    Maint PTNS    HPI: Kaitlyn Ramos  is an 81 year old white female who suffers with urge incontinence who presents today for PTNS treatment. This is a monthly maintained treatment.  Patient has been suffering with urge incontinence for last few years. She does not recall a specific event that set off the incontinence.  She has been experiencing a decrease in her incontinence and urgency since starting the PTNS.  After her last treatment,  she is currently experiencing 0 (unchanged) leakage episode a day. She is experiencing a daytime frequency of 4 (unchanged) trips to the restroom daily.  She admits to nocturia once nightly (unchanged). She does not have pain or burning with urination. She has a mild urge to urinate. She uses poise pads and finding them dry more often.  She is still having few incidences of urge incontinence and is wearing depends at night for security.  She does not consume alcohol and she drinks 2 caffeinated beverages daily.  She has tried fluid restrictions and Kegel exercises without improvement of her urinary symptoms when she presented to [place] in February, she had a trial of Myrbetriq which she found ineffective. She was then given a trial with Vesicare 5 mg daily. She experienced good control of her urinary symptoms, but she experienced some dry mouth and urinary retention.  She has restarted the Vesicare.    She does not have any contraindications presents for PTNS, such as: Pacemaker, implantable defibrillator, history of abnormal bleeding, history of neuropathy or nerve damage or pregnancy.    We discussed the possible complication of the procedure, such as: Discomfort, bleeding at the insertion/stimulation site and procedure consent is signed.  Her  goals for therapy are reduced urgency and no accidents.  Blood pressure 169/82, pulse 72, height 5' (1.524 m), weight 163 lb 1.6 oz (73.982 kg).  PTNS treatment: The patient's right ankle is cleansed with rubbing alcohol. The needle electrode was inserted into the lower, inner aspect of patient's left leg. A surface electrode was placed on the inside arch of the foot on the treatment leg. The lead set was connected to the stimulator and the needle electrode clip was connected to the needle electrode. The stimulator that produces an adjustable electrical pulse that travels to sacral nerve plexus via the tibial and nerve was increased to 5 and patient received a sensory response to the stimulus. She tolerated the procedure well for 30 minutes.        Assessment & Plan:    1. Urge incontinence-  Patient feels that she is responding well to the PTNS treatment.  She will RTC in one month for her maintenance PTNS.  I will contact [employer] to see if we can get her insurance company to cover her depends.    Return in about 1 month (around 04/26/2015) for maintenance PTNS.  Zara Council, [facility] [address] [place], [place] [address] [phone]

## 2015-03-27 DIAGNOSIS — M25511 Pain in right shoulder: Secondary | ICD-10-CM | POA: Diagnosis not present

## 2015-03-27 DIAGNOSIS — M25311 Other instability, right shoulder: Secondary | ICD-10-CM | POA: Diagnosis not present

## 2015-03-27 DIAGNOSIS — M6281 Muscle weakness (generalized): Secondary | ICD-10-CM | POA: Diagnosis not present

## 2015-03-30 DIAGNOSIS — M25511 Pain in right shoulder: Secondary | ICD-10-CM | POA: Diagnosis not present

## 2015-03-30 DIAGNOSIS — M6281 Muscle weakness (generalized): Secondary | ICD-10-CM | POA: Diagnosis not present

## 2015-03-30 DIAGNOSIS — M25311 Other instability, right shoulder: Secondary | ICD-10-CM | POA: Diagnosis not present

## 2015-03-31 DIAGNOSIS — M25311 Other instability, right shoulder: Secondary | ICD-10-CM | POA: Diagnosis not present

## 2015-03-31 DIAGNOSIS — M6281 Muscle weakness (generalized): Secondary | ICD-10-CM | POA: Diagnosis not present

## 2015-03-31 DIAGNOSIS — M25511 Pain in right shoulder: Secondary | ICD-10-CM | POA: Diagnosis not present

## 2015-04-02 DIAGNOSIS — M25511 Pain in right shoulder: Secondary | ICD-10-CM | POA: Diagnosis not present

## 2015-04-02 DIAGNOSIS — M6281 Muscle weakness (generalized): Secondary | ICD-10-CM | POA: Diagnosis not present

## 2015-04-02 DIAGNOSIS — M25311 Other instability, right shoulder: Secondary | ICD-10-CM | POA: Diagnosis not present

## 2015-04-07 DIAGNOSIS — M25311 Other instability, right shoulder: Secondary | ICD-10-CM | POA: Diagnosis not present

## 2015-04-07 DIAGNOSIS — M25511 Pain in right shoulder: Secondary | ICD-10-CM | POA: Diagnosis not present

## 2015-04-07 DIAGNOSIS — M6281 Muscle weakness (generalized): Secondary | ICD-10-CM | POA: Diagnosis not present

## 2015-04-09 DIAGNOSIS — M25311 Other instability, right shoulder: Secondary | ICD-10-CM | POA: Diagnosis not present

## 2015-04-09 DIAGNOSIS — M6281 Muscle weakness (generalized): Secondary | ICD-10-CM | POA: Diagnosis not present

## 2015-04-09 DIAGNOSIS — M25511 Pain in right shoulder: Secondary | ICD-10-CM | POA: Diagnosis not present

## 2015-04-10 DIAGNOSIS — M25511 Pain in right shoulder: Secondary | ICD-10-CM | POA: Diagnosis not present

## 2015-04-10 DIAGNOSIS — M25311 Other instability, right shoulder: Secondary | ICD-10-CM | POA: Diagnosis not present

## 2015-04-10 DIAGNOSIS — M6281 Muscle weakness (generalized): Secondary | ICD-10-CM | POA: Diagnosis not present

## 2015-04-14 DIAGNOSIS — M25511 Pain in right shoulder: Secondary | ICD-10-CM | POA: Diagnosis not present

## 2015-04-14 DIAGNOSIS — M25311 Other instability, right shoulder: Secondary | ICD-10-CM | POA: Diagnosis not present

## 2015-04-14 DIAGNOSIS — M6281 Muscle weakness (generalized): Secondary | ICD-10-CM | POA: Diagnosis not present

## 2015-04-15 DIAGNOSIS — M6281 Muscle weakness (generalized): Secondary | ICD-10-CM | POA: Diagnosis not present

## 2015-04-15 DIAGNOSIS — M25511 Pain in right shoulder: Secondary | ICD-10-CM | POA: Diagnosis not present

## 2015-04-15 DIAGNOSIS — M25311 Other instability, right shoulder: Secondary | ICD-10-CM | POA: Diagnosis not present

## 2015-04-21 DIAGNOSIS — M25311 Other instability, right shoulder: Secondary | ICD-10-CM | POA: Diagnosis not present

## 2015-04-21 DIAGNOSIS — M25511 Pain in right shoulder: Secondary | ICD-10-CM | POA: Diagnosis not present

## 2015-04-23 DIAGNOSIS — M25311 Other instability, right shoulder: Secondary | ICD-10-CM | POA: Diagnosis not present

## 2015-04-23 DIAGNOSIS — M25511 Pain in right shoulder: Secondary | ICD-10-CM | POA: Diagnosis not present

## 2015-04-24 DIAGNOSIS — M25311 Other instability, right shoulder: Secondary | ICD-10-CM | POA: Diagnosis not present

## 2015-04-24 DIAGNOSIS — M25511 Pain in right shoulder: Secondary | ICD-10-CM | POA: Diagnosis not present

## 2015-04-27 DIAGNOSIS — M25311 Other instability, right shoulder: Secondary | ICD-10-CM | POA: Diagnosis not present

## 2015-04-27 DIAGNOSIS — M25511 Pain in right shoulder: Secondary | ICD-10-CM | POA: Diagnosis not present

## 2015-04-28 DIAGNOSIS — M25511 Pain in right shoulder: Secondary | ICD-10-CM | POA: Diagnosis not present

## 2015-04-28 DIAGNOSIS — M25311 Other instability, right shoulder: Secondary | ICD-10-CM | POA: Diagnosis not present

## 2015-04-30 ENCOUNTER — Ambulatory Visit (INDEPENDENT_AMBULATORY_CARE_PROVIDER_SITE_OTHER): Payer: Medicare Other | Admitting: Urology

## 2015-04-30 ENCOUNTER — Encounter: Payer: Self-pay | Admitting: Urology

## 2015-04-30 VITALS — BP 139/67 | HR 82 | Ht 62.0 in | Wt 162.3 lb

## 2015-04-30 DIAGNOSIS — N3941 Urge incontinence: Secondary | ICD-10-CM

## 2015-04-30 LAB — PTNS-PERCUTANEOUS TIBIAL NERVE STIMULATION: SCAN RESULT: 8

## 2015-04-30 NOTE — Progress Notes (Signed)
    11:47 AM   Kaitlyn Ramos 12-26-1927 [id number]  Referring provider: Maryland Pink, MD [address] [facility] [place], [place] [address]  Chief Complaint  Patient presents with  . Urge Incontinence    PTNS    HPI: Kaitlyn Ramos  is an 82 year old white female who suffers with urge incontinence who presents today for PTNS treatment. This is a monthly maintained treatment.  Patient has been suffering with urge incontinence for last few years. She does not recall a specific event that set off the incontinence.  She has been experiencing a decrease in her incontinence and urgency since starting the PTNS.  After her last treatment,  she is currently experiencing 0 (unchanged) leakage episode a day. She is experiencing a daytime frequency of 3 (improved) trips to the restroom daily.  She admits to nocturia once nightly (unchanged). She does not have pain or burning with urination. She has a mild urge to urinate. She uses poise pads and finding them dry more often.  She is still having few incidences of urge incontinence and is wearing depends at night for security.  She does not consume alcohol and she drinks 2 caffeinated beverages daily.  She has tried fluid restrictions and Kegel exercises without improvement of her urinary symptoms when she presented to [place] in February, she had a trial of Myrbetriq which she found ineffective. She was then given a trial with Vesicare 5 mg daily. She experienced good control of her urinary symptoms, but she experienced some dry mouth and urinary retention.  She has restarted the Vesicare.    She does not have any contraindications presents for PTNS, such as: Pacemaker, implantable defibrillator, history of abnormal bleeding, history of neuropathy or nerve damage or pregnancy.    We discussed the possible complication of the procedure, such as: Discomfort, bleeding at the insertion/stimulation site and procedure consent is signed.  Her goals for  therapy are reduced urgency and no accidents.  Blood pressure 139/67, pulse 82, height 5\' 2"  (1.575 m), weight 162 lb 4.8 oz (73.619 kg).  PTNS treatment: The patient's right ankle is cleansed with rubbing alcohol. The needle electrode was inserted into the lower, inner aspect of patient's left leg. A surface electrode was placed on the inside arch of the foot on the treatment leg. The lead set was connected to the stimulator and the needle electrode clip was connected to the needle electrode. The stimulator that produces an adjustable electrical pulse that travels to sacral nerve plexus via the tibial and nerve was increased to 8 for 15 minutes and 6 for 15 minutes and patient received a sensory response to the stimulus. She tolerated the procedure well for 30 minutes.      Assessment & Plan:    1. Urge incontinence-  Patient feels that she is responding well to the PTNS treatment.  She will RTC in one month for her maintenance PTNS.  Medicare will not cover her depends.    RTC in one month for PTNS  Zara Council, PA-C  [facility] [address], [place], [place] [address] [phone]

## 2015-05-01 DIAGNOSIS — M25311 Other instability, right shoulder: Secondary | ICD-10-CM | POA: Diagnosis not present

## 2015-05-01 DIAGNOSIS — M25511 Pain in right shoulder: Secondary | ICD-10-CM | POA: Diagnosis not present

## 2015-05-05 DIAGNOSIS — M25311 Other instability, right shoulder: Secondary | ICD-10-CM | POA: Diagnosis not present

## 2015-05-05 DIAGNOSIS — M25511 Pain in right shoulder: Secondary | ICD-10-CM | POA: Diagnosis not present

## 2015-05-06 ENCOUNTER — Encounter: Payer: Self-pay | Admitting: Gastroenterology

## 2015-05-07 DIAGNOSIS — M25311 Other instability, right shoulder: Secondary | ICD-10-CM | POA: Diagnosis not present

## 2015-05-07 DIAGNOSIS — M25511 Pain in right shoulder: Secondary | ICD-10-CM | POA: Diagnosis not present

## 2015-05-08 DIAGNOSIS — M25511 Pain in right shoulder: Secondary | ICD-10-CM | POA: Diagnosis not present

## 2015-05-08 DIAGNOSIS — M25311 Other instability, right shoulder: Secondary | ICD-10-CM | POA: Diagnosis not present

## 2015-05-19 DIAGNOSIS — E039 Hypothyroidism, unspecified: Secondary | ICD-10-CM | POA: Diagnosis not present

## 2015-05-19 DIAGNOSIS — E538 Deficiency of other specified B group vitamins: Secondary | ICD-10-CM | POA: Diagnosis not present

## 2015-05-19 DIAGNOSIS — I10 Essential (primary) hypertension: Secondary | ICD-10-CM | POA: Diagnosis not present

## 2015-05-19 DIAGNOSIS — M79671 Pain in right foot: Secondary | ICD-10-CM | POA: Diagnosis not present

## 2015-05-19 DIAGNOSIS — E785 Hyperlipidemia, unspecified: Secondary | ICD-10-CM | POA: Diagnosis not present

## 2015-05-20 DIAGNOSIS — M6281 Muscle weakness (generalized): Secondary | ICD-10-CM | POA: Diagnosis not present

## 2015-05-20 DIAGNOSIS — M25511 Pain in right shoulder: Secondary | ICD-10-CM | POA: Diagnosis not present

## 2015-05-26 ENCOUNTER — Encounter: Payer: Self-pay | Admitting: Family Medicine

## 2015-05-26 DIAGNOSIS — Z961 Presence of intraocular lens: Secondary | ICD-10-CM | POA: Diagnosis not present

## 2015-05-28 ENCOUNTER — Ambulatory Visit: Payer: Medicare Other | Admitting: Urology

## 2015-06-03 DIAGNOSIS — G5761 Lesion of plantar nerve, right lower limb: Secondary | ICD-10-CM | POA: Diagnosis not present

## 2015-06-04 ENCOUNTER — Encounter: Payer: Self-pay | Admitting: Urology

## 2015-06-04 ENCOUNTER — Ambulatory Visit (INDEPENDENT_AMBULATORY_CARE_PROVIDER_SITE_OTHER): Payer: Medicare Other | Admitting: Urology

## 2015-06-04 VITALS — Resp 16 | Ht 62.0 in | Wt 164.0 lb

## 2015-06-04 DIAGNOSIS — N3941 Urge incontinence: Secondary | ICD-10-CM

## 2015-06-04 LAB — PTNS-PERCUTANEOUS TIBIAL NERVE STIMULATION: SCAN RESULT: 1

## 2015-06-04 NOTE — Progress Notes (Signed)
    11:17 AM   Kaitlyn Ramos January 21, 1928 [id number]  Referring provider: Maryland Pink, MD [address] [facility] [place], [place] [address]  Chief Complaint  Patient presents with  . urge incontinence    PTNS Maint.    HPI: Kaitlyn Ramos  is an 82 year old white female who suffers with urge incontinence who presents today for PTNS treatment. This is a monthly maintained treatment.  Patient has been suffering with urge incontinence for last few years. She does not recall a specific event that set off the incontinence.  She has been experiencing a decrease in her incontinence and urgency since starting the PTNS.  After her last treatment,  she is currently experiencing 0 (unchanged) leakage episode a day. She is experiencing a daytime frequency of 4 (worse) trips to the restroom daily.  She admits to nocturia once nightly (unchanged). She does not have pain or burning with urination. She has a mild urge to urinate. She uses poise pads and finding them dry more often.  She is still having few incidences of urge incontinence and is wearing depends at night for security.  She does not consume alcohol and she drinks 2 caffeinated beverages daily.  She has tried fluid restrictions and Kegel exercises without improvement of her urinary symptoms when she presented to [place] in February, she had a trial of Myrbetriq which she found ineffective. She was then given a trial with Vesicare 5 mg daily. She experienced good control of her urinary symptoms, but she experienced some dry mouth and urinary retention.  She has restarted the Vesicare.    She does not have any contraindications presents for PTNS, such as: Pacemaker, implantable defibrillator, history of abnormal bleeding, history of neuropathy or nerve damage or pregnancy.    We discussed the possible complication of the procedure, such as: Discomfort, bleeding at the insertion/stimulation site and procedure consent is signed.  Her goals  for therapy are reduced urgency and no accidents.  Resp. rate 16, height 5\' 2"  (1.575 m), weight 164 lb (74.39 kg).  PTNS treatment: The patient's right ankle is cleansed with rubbing alcohol. The needle electrode was inserted into the lower, inner aspect of patient's right leg. A surface electrode was placed on the inside arch of the foot on the treatment leg. The lead set was connected to the stimulator and the needle electrode clip was connected to the needle electrode. The stimulator that produces an adjustable electrical pulse that travels to sacral nerve plexus via the tibial and nerve was increased to 1 and patient received a sensory response to the stimulus. She tolerated the procedure well for 30 minutes.     Assessment & Plan:    1. Urge incontinence-  Patient feels that she is responding well to the PTNS treatment.  She will RTC in one month for her maintenance PTNS.  Medicare will not cover her depends.  She would like to extend her visits to every 6 weeks.    RTC in one month for PTNS  Zara Council, PA-C  [facility] [address] [place], [place] [address] [phone]

## 2015-06-08 DIAGNOSIS — M25511 Pain in right shoulder: Secondary | ICD-10-CM | POA: Diagnosis not present

## 2015-06-08 DIAGNOSIS — M6281 Muscle weakness (generalized): Secondary | ICD-10-CM | POA: Diagnosis not present

## 2015-06-22 ENCOUNTER — Telehealth: Payer: Self-pay | Admitting: Urology

## 2015-06-22 DIAGNOSIS — N3281 Overactive bladder: Secondary | ICD-10-CM

## 2015-06-22 MED ORDER — VESICARE 5 MG PO TABS: 5.0000 mg | ORAL_TABLET | Freq: Every day | ORAL | Status: DC

## 2015-06-22 NOTE — Telephone Encounter (Signed)
Medication sent to pharmacy  

## 2015-06-22 NOTE — Telephone Encounter (Signed)
Pt needs refill on Vesicare.  She uses [facility].  She is out.  Her cell# if you need it: [phone]

## 2015-07-14 DIAGNOSIS — H8110 Benign paroxysmal vertigo, unspecified ear: Secondary | ICD-10-CM | POA: Diagnosis not present

## 2015-07-14 DIAGNOSIS — J309 Allergic rhinitis, unspecified: Secondary | ICD-10-CM | POA: Diagnosis not present

## 2015-07-14 DIAGNOSIS — I10 Essential (primary) hypertension: Secondary | ICD-10-CM | POA: Diagnosis not present

## 2015-07-16 ENCOUNTER — Encounter: Payer: Self-pay | Admitting: Urology

## 2015-07-16 ENCOUNTER — Ambulatory Visit: Payer: Medicare Other | Admitting: Urology

## 2015-08-05 ENCOUNTER — Encounter: Payer: Self-pay | Admitting: Urology

## 2015-08-05 ENCOUNTER — Ambulatory Visit (INDEPENDENT_AMBULATORY_CARE_PROVIDER_SITE_OTHER): Payer: Medicare Other | Admitting: Urology

## 2015-08-05 VITALS — BP 139/67 | Ht 62.0 in | Wt 163.7 lb

## 2015-08-05 DIAGNOSIS — N3941 Urge incontinence: Secondary | ICD-10-CM

## 2015-08-05 LAB — PTNS-PERCUTANEOUS TIBIAL NERVE STIMULATION: SCAN RESULT: 4

## 2015-08-05 NOTE — Progress Notes (Signed)
    4:05 PM   IMANIE DASHNAW 1927/11/17 [id number]  Referring provider: Maryland Pink, MD [address] [facility] [place], [place] [address]  Chief Complaint  Patient presents with  . Urge Incontinence    PTNS    HPI: Kaitlyn Ramos  is an 82 year old white female who suffers with urge incontinence who presents today for PTNS treatment. This is a monthly maintained treatment.  Patient has been suffering with urge incontinence for last few years. She does not recall a specific event that set off the incontinence.  She has been experiencing a decrease in her incontinence and urgency since starting the PTNS.  After her last treatment,  she is currently experiencing 1 (worse) leakage episode a day. She is experiencing a daytime frequency of 4 (stable) trips to the restroom daily.  She admits to nocturia once nightly (unchanged). She does not have pain or burning with urination. She has a mild urge to urinate. She uses poise pads and finding them dry more often.  She is still having few incidences of urge incontinence and is wearing depends at night for security.  She does not consume alcohol and she drinks 2 caffeinated beverages daily.  She has tried fluid restrictions and Kegel exercises without improvement of her urinary symptoms when she presented to [place] in February, she had a trial of Myrbetriq which she found ineffective. She was then given a trial with Vesicare 5 mg daily.  She recently switched pharmacies and has gone one week without the [facility].   She stated she has not noticed a difference since the Vesicare has not taken.  He would like a trial without the Vesicare to see if her symptoms worsen.  She does not have any contraindications presents for PTNS, such as: Pacemaker, implantable defibrillator, history of abnormal bleeding, history of neuropathy or nerve damage or pregnancy.    We discussed the possible complication of the procedure, such as: Discomfort, bleeding at  the insertion/stimulation site and procedure consent is signed.  Her goals for therapy are reduced urgency and no accidents.  Height 5\' 2"  (1.575 m), weight 163 lb 11.2 oz (74.254 kg).  PTNS treatment: The patient's right ankle is cleansed with rubbing alcohol. The needle electrode was inserted into the lower, inner aspect of patient's right leg. A surface electrode was placed on the inside arch of the foot on the treatment leg. The lead set was connected to the stimulator and the needle electrode clip was connected to the needle electrode. The stimulator that produces an adjustable electrical pulse that travels to sacral nerve plexus via the tibial and nerve was increased to 4 and patient received a sensory response and a toe flex to the stimulus. She tolerated the procedure well for 30 minutes.     Assessment & Plan:    1. Urge incontinence-  Patient feels that she is responding well to the PTNS treatment.  She will RTC in 6 weeks for her maintenance PTNS.    RTC in 6 weeks for PTNS  Zara Council, [facility] [address] [place], [place] [address] [phone]

## 2015-08-07 ENCOUNTER — Emergency Department
Admission: EM | Admit: 2015-08-07 | Discharge: 2015-08-07 | Disposition: A | Discharge status: Home / Self Care | Payer: Medicare Other | Attending: Emergency Medicine | Admitting: Emergency Medicine

## 2015-08-07 ENCOUNTER — Emergency Department: Payer: Medicare Other

## 2015-08-07 DIAGNOSIS — Z87891 Personal history of nicotine dependence: Secondary | ICD-10-CM | POA: Diagnosis not present

## 2015-08-07 DIAGNOSIS — E039 Hypothyroidism, unspecified: Secondary | ICD-10-CM | POA: Insufficient documentation

## 2015-08-07 DIAGNOSIS — R42 Dizziness and giddiness: Secondary | ICD-10-CM | POA: Insufficient documentation

## 2015-08-07 DIAGNOSIS — E785 Hyperlipidemia, unspecified: Secondary | ICD-10-CM | POA: Diagnosis not present

## 2015-08-07 DIAGNOSIS — M810 Age-related osteoporosis without current pathological fracture: Secondary | ICD-10-CM | POA: Diagnosis not present

## 2015-08-07 DIAGNOSIS — R0789 Other chest pain: Secondary | ICD-10-CM | POA: Insufficient documentation

## 2015-08-07 DIAGNOSIS — Z79899 Other long term (current) drug therapy: Secondary | ICD-10-CM | POA: Insufficient documentation

## 2015-08-07 DIAGNOSIS — R079 Chest pain, unspecified: Secondary | ICD-10-CM | POA: Diagnosis not present

## 2015-08-07 DIAGNOSIS — F329 Major depressive disorder, single episode, unspecified: Secondary | ICD-10-CM | POA: Diagnosis not present

## 2015-08-07 DIAGNOSIS — Z792 Long term (current) use of antibiotics: Secondary | ICD-10-CM | POA: Diagnosis not present

## 2015-08-07 DIAGNOSIS — I10 Essential (primary) hypertension: Secondary | ICD-10-CM | POA: Diagnosis not present

## 2015-08-07 LAB — CBC
HCT: 40.8 % (ref 35.0–47.0)
Hemoglobin: 13.7 g/dL (ref 12.0–16.0)
MCH: 28.8 pg (ref 26.0–34.0)
MCHC: 33.7 g/dL (ref 32.0–36.0)
MCV: 85.4 fL (ref 80.0–100.0)
PLATELETS: 164 10*3/uL (ref 150–440)
RBC: 4.77 MIL/uL (ref 3.80–5.20)
RDW: 13.5 % (ref 11.5–14.5)
WBC: 5.9 10*3/uL (ref 3.6–11.0)

## 2015-08-07 LAB — TROPONIN I
Troponin I: <0.03 ng/mL (ref <0.031)
Troponin I: <0.03 ng/mL (ref <0.031)

## 2015-08-07 LAB — BASIC METABOLIC PANEL
ANION GAP: 8 (ref 5–15)
BUN: 16 mg/dL (ref 6–20)
CHLORIDE: 104 mmol/L (ref 101–111)
CO2: 25 mmol/L (ref 22–32)
CREATININE: 0.92 mg/dL (ref 0.44–1.00)
Calcium: 9.1 mg/dL (ref 8.9–10.3)
GFR calc non Af Amer: 55 mL/min — ABNORMAL LOW (ref >60)
Glucose, Bld: 83 mg/dL (ref 65–99)
POTASSIUM: 4 mmol/L (ref 3.5–5.1)
SODIUM: 137 mmol/L (ref 135–145)

## 2015-08-07 MED ORDER — ASPIRIN 81 MG PO CHEW
324.0000 mg | CHEWABLE_TABLET | Freq: Once | ORAL | Status: AC
Start: 2015-08-07 — End: 2015-08-07
  Administered 2015-08-07: 324 mg via ORAL
  Filled 2015-08-07: qty 4

## 2015-08-07 MED ORDER — IOPAMIDOL (ISOVUE-300) INJECTION 61%: 75.0000 mL | Freq: Once | INTRAVENOUS | Status: DC | PRN

## 2015-08-07 MED ORDER — ONDANSETRON HCL 4 MG/2ML IJ SOLN: 4.0000 mg | Freq: Once | INTRAMUSCULAR | Status: DC

## 2015-08-07 MED ORDER — IOPAMIDOL (ISOVUE-370) INJECTION 76%
75.0000 mL | Freq: Once | INTRAVENOUS | Status: AC | PRN
  Administered 2015-08-07: 75 mL via INTRAVENOUS

## 2015-08-07 MED ORDER — ONDANSETRON HCL 4 MG/2ML IJ SOLN
INTRAMUSCULAR | Status: AC
  Filled 2015-08-07: qty 2

## 2015-08-07 NOTE — ED Provider Notes (Signed)
[facility] Emergency Department Provider Note  ____________________________________________  Time seen: Approximately 2:30 PM  I have reviewed the triage vital signs and the nursing notes.   HISTORY  Chief Complaint Chest Pain    HPI Kaitlyn Ramos is a 82 y.o. female presents for evaluation of chest pain.  Patient reports about 1 PM while at rest she suddenly developed a sharp pain in her left chest below the left breast. She states it kind of radiated up towards her left shoulder and then moved into her back between her shoulder blades. The pain is gone away now, and went away without taking any medicine just prior to EMS arriving about 20 minutes after the pain started. He  She denies any other symptoms. She reports she has "fibrous breast tissue" that does cause some time similar pain in the past. She reports no concerns at present but has been a little dizzy for the last 3 weeks, she should resist to having seasonal allergies. No headache numbness tingling or weakness.  No abdominal pain.  Denies any previous history of heart disease.   Past Medical History  Diagnosis Date  . Essential hypertension   . Senile osteoporosis   . Restless leg   . IBS (irritable bowel syndrome)   . Ulcerative colitis ([place])   . Hemorrhoids   . Shingles   . History of MRSA infection   . Hyperlipidemia   . Osteoarthritis of multiple joints   . Depression   . Sleep apnea   . Hypothyroidism   . Cervicalgia   . Urge incontinence   . Hypertension     Patient Active Problem List   Diagnosis Date Noted  . Pain in shoulder 11/25/2014  . Impingement syndrome of shoulder 11/25/2014  . Bilirubin in urine 10/04/2014  . Recurrent UTI 09/28/2014  . Urge incontinence 09/28/2014  . Microscopic hematuria 09/28/2014  . Cervical pain 09/28/2014  . Degenerative joint disease involving multiple joints 09/28/2014  . Involutional osteoporosis 09/28/2014  . Adult  hypothyroidism 09/28/2014  . Acute low back pain 08/26/2014  . Arthralgia of hip 08/26/2014  . Combined fat and carbohydrate induced hyperlipemia 01/23/2014  . BP (high blood pressure) 01/23/2014  . Apnea, sleep 01/23/2014    Past Surgical History  Procedure Laterality Date  . Cataract surgery    . Esophagogastroduodenoscopy  2014  . Parotid gland removal  1984  . Abdominal hysterectomy  1980  . Cholecystectomy  1955  . Makoplasty Left 06/2012  . Colon polyp removal  12/2012    Current Outpatient Rx  Name  Route  Sig  Dispense  Refill  . amLODipine (NORVASC) 2.5 MG tablet   Oral   Take 2.5 mg by mouth daily.         Marland Kitchen amoxicillin (AMOXIL) 500 MG capsule      Reported on 08/05/2015      1   . calcium carbonate (TUMS - DOSED IN MG ELEMENTAL CALCIUM) 500 MG chewable tablet   Oral   Chew 1 tablet by mouth daily. Reported on 04/30/2015         . Calcium Carbonate-Vitamin D (CALCIUM 500 + D) 500-125 MG-UNIT TABS   Oral   Take by mouth.         . ciprofloxacin (CIPRO) 500 MG tablet      Reported on 08/05/2015      0   . cyanocobalamin 1000 MCG tablet   Oral   Take 1,000 mcg by mouth daily.         Marland Kitchen  DULoxetine (CYMBALTA) 30 MG capsule   Oral   Take 30 mg by mouth daily.         Marland Kitchen etodolac (LODINE) 400 MG tablet   Oral   Take 400 mg by mouth 2 (two) times daily.      2   . fluticasone (FLONASE) 50 MCG/ACT nasal spray   Nasal   Place into the nose.         . levothyroxine (SYNTHROID, LEVOTHROID) 50 MCG tablet   Oral   Take 50 mcg by mouth daily before breakfast.         . meclizine (ANTIVERT) 12.5 MG tablet   Oral   Take by mouth.         . Multiple Vitamins-Minerals (MULTIVITAMIN ADULT PO)   Oral   Take by mouth daily.         Marland Kitchen omeprazole (PRILOSEC) 20 MG capsule   Oral   Take 20 mg by mouth daily.         Marland Kitchen rOPINIRole (REQUIP) 0.25 MG tablet   Oral   Take 0.5 mg by mouth at bedtime.          . simvastatin (ZOCOR) 20 MG  tablet   Oral   Take 20 mg by mouth daily.         . traMADol (ULTRAM) 50 MG tablet   Oral   Take by mouth.         . VESICARE 5 MG tablet   Oral   Take 1 tablet (5 mg total) by mouth daily.   30 tablet   3     Dispense as written.     Allergies Codeine sulfate  Family History  Problem Relation Age of Onset  . Breast cancer Daughter   . Colon cancer Maternal Grandfather   . Hypertension Father   . Arthritis Mother   . Dementia Father   . Osteoporosis Mother   . Kidney disease Neg Hx   . Bladder Cancer Neg Hx     Social History Social History  Substance Use Topics  . Smoking status: Former Research scientist (life sciences)  . Smokeless tobacco: None     Comment: quit at age 14  . Alcohol Use: No     Comment: occasional    Review of Systems Constitutional: No fever/chills Eyes: No visual changes. ENT: No sore throat. Cardiovascular: See history of present illness Respiratory: Denies shortness of breath. Gastrointestinal: No abdominal pain.  No nausea, no vomiting.  No diarrhea.  No constipation. Genitourinary: Negative for dysuria. Musculoskeletal: Negative for back pain. Skin: Negative for rash. Neurological: Negative for headaches, focal weakness or numbness.  No recent hospitalizations, surgeries, leg swelling, or cough. No history of blood clots.  10-point ROS otherwise negative.  ____________________________________________   PHYSICAL EXAM:  VITAL SIGNS: ED Triage Vitals  Enc Vitals Group     BP 08/07/15 1402 164/84 mmHg     Pulse Rate 08/07/15 1402 72     Resp 08/07/15 1402 16     Temp 08/07/15 1402 98.2 F (36.8 C)     Temp Source 08/07/15 1402 Oral     SpO2 08/07/15 1402 95 %     Weight 08/07/15 1402 160 lb (72.576 kg)     Height 08/07/15 1402 5\' 2"  (1.575 m)     Head Cir --      Peak Flow --      Pain Score --      Pain Loc --      Pain  Edu? --      Excl. in [place]? --    Constitutional: Alert and oriented. Well appearing and in no acute distress.Patient  her daughter both very pleasant. Eyes: Conjunctivae are normal. PERRL. EOMI. Head: Atraumatic. Nose: No congestion/rhinnorhea. Mouth/Throat: Mucous membranes are moist.  Oropharynx non-erythematous. Neck: No stridor.   Cardiovascular: Normal rate, regular rhythm. Grossly normal heart sounds.  Good peripheral circulation. Respiratory: Normal respiratory effort.  No retractions. Lungs CTAB. Gastrointestinal: Soft and nontender. No distention.  Musculoskeletal: No lower extremity tenderness nor edema.  No joint effusions. Neurologic:  Normal speech and language. No gross focal neurologic deficits are appreciated. Skin:  Skin is warm, dry and intact. No rash noted. Psychiatric: Mood and affect are normal. Speech and behavior are normal.  ____________________________________________   LABS (all labs ordered are listed, but only abnormal results are displayed)  Labs Reviewed  BASIC METABOLIC PANEL - Abnormal; Notable for the following:    GFR calc non Af Amer 55 (*)    All other components within normal limits  CBC  TROPONIN I  TROPONIN I   ____________________________________________  EKG  ED ECG REPORT I, QUALE, MARK, the attending physician, personally viewed and interpreted this ECG.  Date: 08/07/2015 EKG Time: 1410 Rate: 70 Rhythm: normal sinus rhythm QRS Axis: normal except for possible old Q waves inferior Intervals: normal ST/T Wave abnormalities: normal Conduction Disturbances: none Narrative Interpretation: unremarkable except for possible old Q waves inferior, no evidence of acute ischemic abnormality  ____________________________________________  RADIOLOGY   CT ANGIO CHEST AORTA W/CM &/OR WO/CM (Final result) Result time: 08/07/15 15:58:03   Final result by Rad Results In Interface (08/07/15 15:58:03)   Narrative:   CLINICAL DATA: Chest pain starting of the left breast in radiating to the jaw. Back pain.  EXAM: CT ANGIOGRAPHY CHEST WITH  CONTRAST  TECHNIQUE: Multidetector CT imaging of the chest was performed using the standard protocol during bolus administration of intravenous contrast. Multiplanar CT image reconstructions and MIPs were obtained to evaluate the vascular anatomy.  CONTRAST: 75 cc Isovue 370  COMPARISON: 08/07/2015 radiograph  FINDINGS: Mediastinum/Nodes: Noncontrast CT images of the chest demonstrate no findings of acute intramural hematoma along the aorta. Mild aortic arch and branch vessel atherosclerotic calcification noted. No cardiomegaly although there is mitral valve and faint aortic valve calcification.  Scattered small mediastinal lymph nodes with internal calcifications could compatible with old granulomatous disease. Calcified bilateral hilar and infrahilar lymph nodes.  No aortic dissection or thoracic aortic aneurysm. No significant stenosis in the branch vessels.  Small type 1 hiatal hernia. Several small adjacent lymph nodes are present, including 1 measuring 7 mm in short axis on image 102/6.  Today' s exam was not protocol to assess the pulmonary arteries, but no large/central pulmonary embolus is identified.  No pericardial effusion.  Lungs/Pleura: Scattered calcified pulmonary nodules are present in both lungs. Given the mediastinal findings, the appearance favors old granulomatous disease. Other entities making give scattered pulmonary calcification such as old varicella pneumonia or metastatic osteosarcoma are considered much less likely.  Upper abdomen: 1.6 cm exophytic lesion from the right kidney upper pole does not significantly enhance, favoring a cyst. Similar 0.7 cm exophytic lesion from the left kidney upper pole the does is indeterminate based on small saw  Old granulomatous disease of the spleen.  Musculoskeletal:  Thoracic spondylosis. Sternum unremarkable.  Review of the MIP images confirms the above findings.  IMPRESSION: 1. No acute vascular  findings in the chest. 2. Old granulomatous disease. 3.  Aortic and mitral valve calcification. Mild aortic atherosclerosis. 4. Small type 1 hiatal hernia. 5. 1.6 cm cyst of the right kidney upper pole. 0.7 cm nonspecific lesion of the left kidney upper pole, probably a cyst but technically too small to characterize.   Electronically Signed By: Van Clines M.D. On: 08/07/2015 15:58          CT Head Wo Contrast (Final result) Result time: 08/07/15 15:48:57   Final result by Rad Results In Interface (08/07/15 15:48:57)   Narrative:   CLINICAL DATA: Chest pain starting of the left breast and radiating to the jaw. Dizziness.  EXAM: CT HEAD WITHOUT CONTRAST  TECHNIQUE: Contiguous axial images were obtained from the base of the skull through the vertex without intravenous contrast.  COMPARISON: 03/05/2013  FINDINGS: Suspected small lacunar infarcts in both external capsules, chronic. Periventricular white matter and corona radiata hypodensities favor chronic ischemic microvascular white matter disease.  The brainstem, cerebellum, cerebral peduncles, thalami, basal ganglia, ventricular system, and basilar cisterns appear unremarkable.  No intracranial hemorrhage, mass lesion, or acute CVA. Visualized paranasal sinuses appear clear.  IMPRESSION: 1. No acute intracranial findings. 2. Periventricular white matter and corona radiata hypodensities favor chronic ischemic microvascular white matter disease. 3. Suspected small chronic lacunar infarcts in both external capsules.   Electronically Signed By: Van Clines M.D. On: 08/07/2015 15:48          DG Chest 2 View (Final result) Result time: 08/07/15 14:40:33   Final result by Rad Results In Interface (08/07/15 14:40:33)   Narrative:   CLINICAL DATA: 82 year old female with a history of chest pain  EXAM: CHEST - 2 VIEW  COMPARISON: None.  FINDINGS: Cardiomediastinal  silhouette within normal limits.  No evidence of pulmonary vascular congestion.  No confluent airspace disease. No pneumothorax. No pleural effusion.  Micro nodular pattern, which was present on the comparison plain film of the abdomen 09/10/2014.  No displaced fracture.  Unremarkable appearance of the upper abdomen.  IMPRESSION: No radiographic evidence of acute cardiopulmonary disease.  Micronodular pattern and the bilateral lungs, which appears to have been present on comparison plain film. Given the patient's history of smoking, interstitial lung disease such as RB/RB ILD may be considered in addition to small partially calcified granulomas, and referral for pulmonary evaluation may be useful.  Signed,  Dulcy Fanny. Earleen Newport, DO  [facility]  [facility]   Electronically Signed By: Corrie Mckusick D.O. On: 08/07/2015 14:40          ____________________________________________   PROCEDURES  Procedure(s) performed: None  Critical Care performed: No  ____________________________________________   INITIAL IMPRESSION / ASSESSMENT AND PLAN / ED COURSE  Pertinent labs & imaging results that were available during my care of the patient were reviewed by me and considered in my medical decision making (see chart for details).  Patient presents for evaluation of somewhat atypical chest pain. She reports a sharp pain in the left chest that resolved on its own after 20 minutes but did radiate towards the left upper chest and into the area behind the shoulder blades. She does have some history of hypertension, and her symptoms and EKG do not seem to suggest an acute coronary syndrome however I am concerned about the possibility of aortic dissection though I feel the likelihood is low overall we will proceed with CT angiography given the moving pain that she experienced today which is now resolved on its own.  I will give the  patient aspirin as we rule out  acute coronary syndrome.  D/W Dr. Ubaldo Glassing. Advises if CT A no dissection, check trop 2 at 3 hours. If normal advises discharge and he can see her in the office Monday or Tuesday. This plan was discussed with the patient and her daughter were both in agreement. She currently has no chest pain, and her symptoms are somewhat atypical. She does not have a history of known coronary disease.  ----------------------------------------- 4:50 PM on 08/07/2015 -----------------------------------------  CT demonstrates no dissection. CT of the head demonstrates possible old lacunar infarcts, no evidence of acute infarction. Discussed with patient and her daughter. Plan to start a baby aspirin daily and follow-up with cardiology on Monday or Tuesday. We will check second troponin. The patient denies any symptoms at this time, resting comfortably. No chest pain.  Return precautions and treatment recommendations and follow-up discussed with the patient who is agreeable with the plan.  ____________________________________________   FINAL CLINICAL IMPRESSION(S) / ED DIAGNOSES  Final diagnoses:  Atypical chest pain      Delman Kitten, MD 08/07/15 1856

## 2015-08-07 NOTE — ED Notes (Signed)
Patient transported to X-ray 

## 2015-08-07 NOTE — ED Notes (Signed)
Pt arrives to ER via [facility] from [facility] c/o CP. PT CP subsided before EMS arrived. Pt describes CP as starting under left breast and radiating to jaw. Pt arrives with 18G to L hand placed by [facility].

## 2015-08-07 NOTE — Discharge Instructions (Signed)
You have been seen in the Emergency Department (ED) today for chest pain.  As we have discussed today’s test results are normal, but you may require further testing. ° °Please follow up with the recommended doctor as instructed above in these documents regarding today’s emergent visit and your recent symptoms to discuss further management.  Continue to take your regular medications. If you are not doing so already, please also take a daily baby aspirin (81 mg), at least until you follow up with your doctor. ° °Return to the Emergency Department (ED) if you experience any further chest pain/pressure/tightness, difficulty breathing, or sudden sweating, or other symptoms that concern you. ° ° °Chest Pain Observation °It is often hard to give a specific diagnosis for the cause of chest pain. Among other possibilities your symptoms might be caused by inadequate oxygen delivery to your heart (angina). Angina that is not treated or evaluated can lead to a heart attack (myocardial infarction) or death. °Blood tests, electrocardiograms, and X-rays may have been done to help determine a possible cause of your chest pain. After evaluation and observation, your health care provider has determined that it is unlikely your pain was caused by an unstable condition that requires hospitalization. However, a full evaluation of your pain may need to be completed, with additional diagnostic testing as directed. It is very important to keep your follow-up appointments. Not keeping your follow-up appointments could result in permanent heart damage, disability, or death. If there is any problem keeping your follow-up appointments, you must call your health care provider. °HOME CARE INSTRUCTIONS  °Due to the slight chance that your pain could be angina, it is important to follow your health care provider's treatment plan and also maintain a healthy lifestyle: °· Maintain or work toward achieving a healthy weight. °· Stay physically active  and exercise regularly. °· Decrease your salt intake. °· Eat a balanced, healthy diet. Talk to a dietitian to learn about heart-healthy foods. °· Increase your fiber intake by including whole grains, vegetables, fruits, and nuts in your diet. °· Avoid situations that cause stress, anger, or depression. °· Take medicines as advised by your health care provider. Report any side effects to your health care provider. Do not stop medicines or adjust the dosages on your own. °· Quit smoking. Do not use nicotine patches or gum until you check with your health care provider. °· Keep your blood pressure, blood sugar, and cholesterol levels within normal limits. °· Limit alcohol intake to no more than 1 drink per day for women who are not pregnant and 2 drinks per day for men. °· Do not abuse drugs. °SEEK IMMEDIATE MEDICAL CARE IF: °You have severe chest pain or pressure which may include symptoms such as: °· You feel pain or pressure in your arms, neck, jaw, or back. °· You have severe back or abdominal pain, feel sick to your stomach (nauseous), or throw up (vomit). °· You are sweating profusely. °· You are having a fast or irregular heartbeat. °· You feel short of breath while at rest. °· You notice increasing shortness of breath during rest, sleep, or with activity. °· You have chest pain that does not get better after rest or after taking your usual medicine. °· You wake from sleep with chest pain. °· You are unable to sleep because you cannot breathe. °· You develop a frequent cough or you are coughing up blood. °· You feel dizzy, faint, or experience extreme fatigue. °· You develop severe weakness, dizziness, fainting,   or chills. °Any of these symptoms may represent a serious problem that is an emergency. Do not wait to see if the symptoms will go away. Call your local emergency services (911 in the [place] Do not drive yourself to the hospital. °MAKE SURE YOU: °· Understand these instructions. °· Will watch your  condition. °· Will get help right away if you are not doing well or get worse. °  °This information is not intended to replace advice given to you by your health care provider. Make sure you discuss any questions you have with your health care provider. °  °Document Released: 05/07/2010 Document Revised: 04/09/2013 Document Reviewed: 10/04/2012 °Elsevier Interactive Patient Education ©2016 [employer] ° °

## 2015-08-19 DIAGNOSIS — E782 Mixed hyperlipidemia: Secondary | ICD-10-CM | POA: Diagnosis not present

## 2015-08-19 DIAGNOSIS — R0789 Other chest pain: Secondary | ICD-10-CM | POA: Diagnosis not present

## 2015-08-19 DIAGNOSIS — I10 Essential (primary) hypertension: Secondary | ICD-10-CM | POA: Diagnosis not present

## 2015-08-19 DIAGNOSIS — G4733 Obstructive sleep apnea (adult) (pediatric): Secondary | ICD-10-CM | POA: Diagnosis not present

## 2015-08-21 DIAGNOSIS — H903 Sensorineural hearing loss, bilateral: Secondary | ICD-10-CM | POA: Diagnosis not present

## 2015-08-21 DIAGNOSIS — R42 Dizziness and giddiness: Secondary | ICD-10-CM | POA: Diagnosis not present

## 2015-08-27 DIAGNOSIS — R0789 Other chest pain: Secondary | ICD-10-CM | POA: Diagnosis not present

## 2015-09-14 DIAGNOSIS — R42 Dizziness and giddiness: Secondary | ICD-10-CM | POA: Diagnosis not present

## 2015-09-15 DIAGNOSIS — G4733 Obstructive sleep apnea (adult) (pediatric): Secondary | ICD-10-CM | POA: Diagnosis not present

## 2015-09-15 DIAGNOSIS — I10 Essential (primary) hypertension: Secondary | ICD-10-CM | POA: Diagnosis not present

## 2015-09-15 DIAGNOSIS — E782 Mixed hyperlipidemia: Secondary | ICD-10-CM | POA: Diagnosis not present

## 2015-09-15 DIAGNOSIS — R42 Dizziness and giddiness: Secondary | ICD-10-CM | POA: Diagnosis not present

## 2015-09-16 ENCOUNTER — Encounter: Payer: Self-pay | Admitting: Urology

## 2015-09-16 ENCOUNTER — Encounter: Payer: Medicare Other | Admitting: Urology

## 2015-09-16 VITALS — Ht 62.0 in

## 2015-09-20 NOTE — Progress Notes (Signed)
This encounter was created in error - please disregard.

## 2015-09-21 ENCOUNTER — Encounter: Payer: Self-pay | Admitting: Urology

## 2015-09-21 ENCOUNTER — Ambulatory Visit: Payer: Medicare Other | Admitting: Urology

## 2015-09-21 DIAGNOSIS — R002 Palpitations: Secondary | ICD-10-CM | POA: Diagnosis not present

## 2015-09-29 ENCOUNTER — Ambulatory Visit (INDEPENDENT_AMBULATORY_CARE_PROVIDER_SITE_OTHER): Payer: Medicare Other | Admitting: Urology

## 2015-09-29 ENCOUNTER — Encounter: Payer: Self-pay | Admitting: Urology

## 2015-09-29 VITALS — BP 137/80 | HR 62 | Ht 62.0 in | Wt 162.7 lb

## 2015-09-29 DIAGNOSIS — N3941 Urge incontinence: Secondary | ICD-10-CM | POA: Diagnosis not present

## 2015-09-29 LAB — PTNS-PERCUTANEOUS TIBIAL NERVE STIMULATION: Scan Result: 10

## 2015-10-01 NOTE — Progress Notes (Signed)
    1:45 PM   [name] 12/05/1927 [id number]  Referring provider: Maryland Pink, MD [address] [facility] [place], [place] [address]  Chief Complaint  Patient presents with  . Urge Incontinence    PTNS    HPI: [name]  is an 82 year old white female who suffers with urge incontinence who presents today for PTNS treatment. This is a maintenance treatment.  Patient has been suffering with urge incontinence for last few years. She does not recall a specific event that set off the incontinence.  She has been experiencing a decrease in her incontinence and urgency since starting the PTNS.  After her last treatment,  she is currently experiencing 1 (stable) leakage episode a day. She is experiencing a daytime frequency of 4 (stable) trips to the restroom daily.  She admits to nocturia once nightly (unchanged). She does not have pain or burning with urination. She has a mild urge to urinate. She uses poise pads and finding them dry more often.  She is still wearing depends at night for security.  She does not consume alcohol and she drinks 2 caffeinated beverages daily.  She has tried fluid restrictions and Kegel exercises without improvement of her urinary symptoms when she presented to [place] in February, she had a trial of Myrbetriq which she found ineffective. She was then given a trial with Vesicare 5 mg daily.  She recently switched pharmacies and has gone one week without the [place].   She stated she has not noticed a difference since the Vesicare has not taken.  He would like a trial without the Vesicare to see if her symptoms worsen.  She does not have any contraindications presents for PTNS, such as: Pacemaker, implantable defibrillator, history of abnormal bleeding, history of neuropathy or nerve damage or pregnancy.    We discussed the possible complication of the procedure, such as: Discomfort, bleeding at the insertion/stimulation site and procedure consent is  signed.  Her goals for therapy are reduced urgency and no accidents.  Blood pressure 137/80, pulse 62, height 5\' 2"  (1.575 m), weight 162 lb 11.2 oz (73.8 kg).  PTNS treatment: The patient's left ankle is cleansed with rubbing alcohol. The needle electrode was inserted into the lower, inner aspect of patient's right leg. A surface electrode was placed on the inside arch of the foot on the treatment leg. The lead set was connected to the stimulator and the needle electrode clip was connected to the needle electrode. The stimulator that produces an adjustable electrical pulse that travels to sacral nerve plexus via the tibial and nerve was increased to 10 and patient received a sensory response and a toe flex to the stimulus. She tolerated the procedure well for 30 minutes.     Assessment & Plan:    1. Urge incontinence-  Patient feels that she is responding well to the PTNS treatment.  She will RTC in 6 weeks for her maintenance PTNS.    RTC in 6 weeks for PTNS  Zara Council, [facility] [address], [place], [place] [address] [phone]

## 2015-10-13 DIAGNOSIS — G4733 Obstructive sleep apnea (adult) (pediatric): Secondary | ICD-10-CM | POA: Diagnosis not present

## 2015-10-13 DIAGNOSIS — I10 Essential (primary) hypertension: Secondary | ICD-10-CM | POA: Diagnosis not present

## 2015-10-13 DIAGNOSIS — E782 Mixed hyperlipidemia: Secondary | ICD-10-CM | POA: Diagnosis not present

## 2015-10-30 ENCOUNTER — Other Ambulatory Visit: Payer: Self-pay

## 2015-10-30 DIAGNOSIS — N3281 Overactive bladder: Secondary | ICD-10-CM

## 2015-10-30 MED ORDER — VESICARE 5 MG PO TABS: 5.0000 mg | ORAL_TABLET | Freq: Every day | ORAL | Status: DC

## 2015-11-10 ENCOUNTER — Ambulatory Visit (INDEPENDENT_AMBULATORY_CARE_PROVIDER_SITE_OTHER): Payer: Medicare Other | Admitting: Urology

## 2015-11-10 ENCOUNTER — Encounter: Payer: Self-pay | Admitting: Urology

## 2015-11-10 VITALS — BP 167/86 | HR 83 | Ht 63.0 in | Wt 164.1 lb

## 2015-11-10 DIAGNOSIS — N3941 Urge incontinence: Secondary | ICD-10-CM | POA: Diagnosis not present

## 2015-11-10 LAB — PTNS-PERCUTANEOUS TIBIAL NERVE STIMULATION: SCAN RESULT: 1

## 2015-11-10 NOTE — Progress Notes (Signed)
    3:07 PM   Kaitlyn Ramos [birth year] [id number]  Referring provider: Maryland Pink, MD [address] [facility] [place], [place] [address]  Chief Complaint  Patient presents with  . Urinary Incontinence    (Urge)  PTNS    HPI: Kaitlyn Ramos  is an 82 year old Caucasian female who suffers with urge incontinence who presents today for PTNS treatment. This is a maintenance treatment.  Patient has been suffering with urge incontinence for last few years. She does not recall a specific event that set off the incontinence.  She has been experiencing a decrease in her incontinence and urgency since starting the PTNS.  After her last treatment,  she is currently experiencing 1 (stable) leakage episode a day. She is experiencing a daytime frequency of 4 (stable) trips to the restroom daily.  She admits to nocturia once nightly (stable). She does not have pain or burning with urination. She has a mild urge to urinate. She uses poise pads and finding them dry more often.  She is still wearing depends at night for security.  She does not consume alcohol and she drinks 2 caffeinated beverages daily.  She has tried fluid restrictions and Kegel exercises without improvement of her urinary symptoms when she presented to [place] in February, she had a trial of Myrbetriq which she found ineffective. She has restarted the Vesicare.    She does not have any contraindications presents for PTNS, such as: Pacemaker, implantable defibrillator, history of abnormal bleeding, history of neuropathy or nerve damage or pregnancy.    We discussed the possible complication of the procedure, such as: Discomfort, bleeding at the insertion/stimulation site and procedure consent is signed.  Her goals for therapy are reduced urgency and no accidents.  Blood pressure (!) 167/86, pulse 83, height 5\' 3"  (1.6 m), weight 164 lb 1.6 oz (74.4 kg).  PTNS treatment: The patient's right ankle is cleansed with rubbing  alcohol. The needle electrode was inserted into the lower, inner aspect of patient's right leg. A surface electrode was placed on the inside arch of the foot on the treatment leg. The lead set was connected to the stimulator and the needle electrode clip was connected to the needle electrode. The stimulator that produces an adjustable electrical pulse that travels to sacral nerve plexus via the tibial and nerve was increased to 1 and patient received a sensory response and a toe flex to the stimulus. She tolerated the procedure well for 30 minutes.     Assessment & Plan:    1. Urge incontinence-  Patient feels that she is responding well to the PTNS treatment.  She will RTC in 6 weeks for her maintenance PTNS.    RTC in 6 weeks for PTNS  Zara Council, [facility] [address] [place], [place] [address] [phone]

## 2015-11-23 DIAGNOSIS — M545 Low back pain: Secondary | ICD-10-CM | POA: Diagnosis not present

## 2015-11-25 DIAGNOSIS — M545 Low back pain: Secondary | ICD-10-CM | POA: Diagnosis not present

## 2015-11-27 DIAGNOSIS — M545 Low back pain: Secondary | ICD-10-CM | POA: Diagnosis not present

## 2015-11-30 DIAGNOSIS — M545 Low back pain: Secondary | ICD-10-CM | POA: Diagnosis not present

## 2015-12-01 DIAGNOSIS — M545 Low back pain: Secondary | ICD-10-CM | POA: Diagnosis not present

## 2015-12-03 DIAGNOSIS — M545 Low back pain: Secondary | ICD-10-CM | POA: Diagnosis not present

## 2015-12-07 DIAGNOSIS — M545 Low back pain: Secondary | ICD-10-CM | POA: Diagnosis not present

## 2015-12-08 DIAGNOSIS — R42 Dizziness and giddiness: Secondary | ICD-10-CM | POA: Diagnosis not present

## 2015-12-08 DIAGNOSIS — S22000A Wedge compression fracture of unspecified thoracic vertebra, initial encounter for closed fracture: Secondary | ICD-10-CM | POA: Diagnosis not present

## 2015-12-08 DIAGNOSIS — M546 Pain in thoracic spine: Secondary | ICD-10-CM | POA: Diagnosis not present

## 2015-12-08 DIAGNOSIS — S22060A Wedge compression fracture of T7-T8 vertebra, initial encounter for closed fracture: Secondary | ICD-10-CM | POA: Diagnosis not present

## 2015-12-09 DIAGNOSIS — M545 Low back pain: Secondary | ICD-10-CM | POA: Diagnosis not present

## 2015-12-10 ENCOUNTER — Other Ambulatory Visit: Payer: Self-pay | Admitting: Orthopedic Surgery

## 2015-12-10 DIAGNOSIS — S22060A Wedge compression fracture of T7-T8 vertebra, initial encounter for closed fracture: Secondary | ICD-10-CM | POA: Diagnosis not present

## 2015-12-11 DIAGNOSIS — M545 Low back pain: Secondary | ICD-10-CM | POA: Diagnosis not present

## 2015-12-14 ENCOUNTER — Ambulatory Visit
Admission: RE | Admit: 2015-12-14 | Discharge: 2015-12-14 | Disposition: A | Discharge status: Home / Self Care | Payer: Medicare Other | Source: Ambulatory Visit | Attending: Orthopedic Surgery | Admitting: Orthopedic Surgery

## 2015-12-14 DIAGNOSIS — S22060A Wedge compression fracture of T7-T8 vertebra, initial encounter for closed fracture: Secondary | ICD-10-CM | POA: Insufficient documentation

## 2015-12-14 DIAGNOSIS — R609 Edema, unspecified: Secondary | ICD-10-CM | POA: Diagnosis not present

## 2015-12-18 ENCOUNTER — Emergency Department: Payer: Medicare Other

## 2015-12-18 ENCOUNTER — Emergency Department
Admission: EM | Admit: 2015-12-18 | Discharge: 2015-12-19 | Disposition: A | Discharge status: Home / Self Care | Payer: Medicare Other | Attending: Student in an Organized Health Care Education/Training Program | Admitting: Student in an Organized Health Care Education/Training Program

## 2015-12-18 DIAGNOSIS — Z7951 Long term (current) use of inhaled steroids: Secondary | ICD-10-CM | POA: Insufficient documentation

## 2015-12-18 DIAGNOSIS — R0781 Pleurodynia: Secondary | ICD-10-CM | POA: Diagnosis not present

## 2015-12-18 DIAGNOSIS — Y999 Unspecified external cause status: Secondary | ICD-10-CM | POA: Diagnosis not present

## 2015-12-18 DIAGNOSIS — S0083XA Contusion of other part of head, initial encounter: Secondary | ICD-10-CM | POA: Insufficient documentation

## 2015-12-18 DIAGNOSIS — Z792 Long term (current) use of antibiotics: Secondary | ICD-10-CM | POA: Diagnosis not present

## 2015-12-18 DIAGNOSIS — W19XXXA Unspecified fall, initial encounter: Secondary | ICD-10-CM | POA: Diagnosis not present

## 2015-12-18 DIAGNOSIS — R0789 Other chest pain: Secondary | ICD-10-CM | POA: Insufficient documentation

## 2015-12-18 DIAGNOSIS — Y92481 Parking lot as the place of occurrence of the external cause: Secondary | ICD-10-CM | POA: Insufficient documentation

## 2015-12-18 DIAGNOSIS — Z87891 Personal history of nicotine dependence: Secondary | ICD-10-CM | POA: Insufficient documentation

## 2015-12-18 DIAGNOSIS — S0990XA Unspecified injury of head, initial encounter: Secondary | ICD-10-CM | POA: Diagnosis not present

## 2015-12-18 DIAGNOSIS — S0011XA Contusion of right eyelid and periocular area, initial encounter: Secondary | ICD-10-CM | POA: Diagnosis not present

## 2015-12-18 DIAGNOSIS — Y9301 Activity, walking, marching and hiking: Secondary | ICD-10-CM | POA: Diagnosis not present

## 2015-12-18 DIAGNOSIS — Z79899 Other long term (current) drug therapy: Secondary | ICD-10-CM | POA: Insufficient documentation

## 2015-12-18 DIAGNOSIS — W0110XA Fall on same level from slipping, tripping and stumbling with subsequent striking against unspecified object, initial encounter: Secondary | ICD-10-CM | POA: Diagnosis not present

## 2015-12-18 DIAGNOSIS — S0093XA Contusion of unspecified part of head, initial encounter: Secondary | ICD-10-CM | POA: Diagnosis not present

## 2015-12-18 DIAGNOSIS — S0003XA Contusion of scalp, initial encounter: Secondary | ICD-10-CM | POA: Diagnosis not present

## 2015-12-18 DIAGNOSIS — M79641 Pain in right hand: Secondary | ICD-10-CM | POA: Diagnosis not present

## 2015-12-18 DIAGNOSIS — E039 Hypothyroidism, unspecified: Secondary | ICD-10-CM | POA: Diagnosis not present

## 2015-12-18 DIAGNOSIS — S6991XA Unspecified injury of right wrist, hand and finger(s), initial encounter: Secondary | ICD-10-CM | POA: Diagnosis not present

## 2015-12-18 DIAGNOSIS — I10 Essential (primary) hypertension: Secondary | ICD-10-CM | POA: Insufficient documentation

## 2015-12-18 DIAGNOSIS — Z791 Long term (current) use of non-steroidal anti-inflammatories (NSAID): Secondary | ICD-10-CM | POA: Diagnosis not present

## 2015-12-18 DIAGNOSIS — R52 Pain, unspecified: Secondary | ICD-10-CM

## 2015-12-18 DIAGNOSIS — M542 Cervicalgia: Secondary | ICD-10-CM | POA: Insufficient documentation

## 2015-12-18 LAB — BASIC METABOLIC PANEL
Anion gap: 9 (ref 5–15)
BUN: 20 mg/dL (ref 6–20)
CHLORIDE: 103 mmol/L (ref 101–111)
CO2: 24 mmol/L (ref 22–32)
Calcium: 8.9 mg/dL (ref 8.9–10.3)
Creatinine, Ser: 0.89 mg/dL (ref 0.44–1.00)
GFR calc Af Amer: >60 mL/min (ref >60)
GFR calc non Af Amer: 57 mL/min — ABNORMAL LOW (ref >60)
GLUCOSE: 123 mg/dL — AB (ref 65–99)
POTASSIUM: 3.5 mmol/L (ref 3.5–5.1)
Sodium: 136 mmol/L (ref 135–145)

## 2015-12-18 LAB — CBC
HEMATOCRIT: 42.4 % (ref 35.0–47.0)
HEMOGLOBIN: 14.5 g/dL (ref 12.0–16.0)
MCH: 29.4 pg (ref 26.0–34.0)
MCHC: 34.2 g/dL (ref 32.0–36.0)
MCV: 85.9 fL (ref 80.0–100.0)
Platelets: 128 10*3/uL — ABNORMAL LOW (ref 150–440)
RBC: 4.94 MIL/uL (ref 3.80–5.20)
RDW: 13.7 % (ref 11.5–14.5)
WBC: 6.9 10*3/uL (ref 3.6–11.0)

## 2015-12-18 MED ORDER — ACETAMINOPHEN 500 MG PO TABS
1000.0000 mg | ORAL_TABLET | Freq: Once | ORAL | Status: AC
  Administered 2015-12-18: 1000 mg via ORAL
  Filled 2015-12-18: qty 2

## 2015-12-18 MED ORDER — AMLODIPINE BESYLATE 5 MG PO TABS
5.0000 mg | ORAL_TABLET | Freq: Once | ORAL | Status: AC
  Administered 2015-12-18: 5 mg via ORAL
  Filled 2015-12-18: qty 1

## 2015-12-18 NOTE — ED Notes (Signed)
Pt denies LOC, remembers the whole event. Pt denies use of blood thinners.

## 2015-12-18 NOTE — ED Provider Notes (Signed)
[facility] Emergency Department Provider Note    First MD Initiated Contact with Patient 12/18/15 2139     (approximate)  I have reviewed the triage vital signs and the nursing notes.   HISTORY  Chief Complaint Head Injury and Fall    HPI Kaitlyn Ramos is a 82 y.o. female presenting with acute right frontal headache status post mechanical fall. Patient states she has a history of vertigo and was at this is living facility tonight ambulating through the parking lot and tried to step over a curb and lost her footing and fell hitting her head. Denies any loss of consciousness. Denies any visual disturbance. Does have large swelling to the right for her head which is the source of her pain. Does not take any blood thinners. She is complaining of neck pain. As well as right hand pain. Denies any hip or abdominal pain. Denies any chest pain or palpitations prior to the episode.   Past Medical History:  Diagnosis Date  . Cervicalgia   . Depression   . Essential hypertension   . Hemorrhoids   . History of MRSA infection   . Hyperlipidemia   . Hypertension   . Hypothyroidism   . IBS (irritable bowel syndrome)   . Osteoarthritis of multiple joints   . Restless leg   . Senile osteoporosis   . Shingles   . Sleep apnea   . Ulcerative colitis ([place])   . Urge incontinence     Patient Active Problem List   Diagnosis Date Noted  . Pain in shoulder 11/25/2014  . Impingement syndrome of shoulder 11/25/2014  . Bilirubin in urine 10/04/2014  . Recurrent UTI 09/28/2014  . Urge incontinence 09/28/2014  . Microscopic hematuria 09/28/2014  . Cervical pain 09/28/2014  . Degenerative joint disease involving multiple joints 09/28/2014  . Involutional osteoporosis 09/28/2014  . Adult hypothyroidism 09/28/2014  . Acute low back pain 08/26/2014  . Arthralgia of hip 08/26/2014  . Combined fat and carbohydrate induced hyperlipemia 01/23/2014  . BP (high blood  pressure) 01/23/2014  . Apnea, sleep 01/23/2014    Past Surgical History:  Procedure Laterality Date  . ABDOMINAL HYSTERECTOMY  1980  . cataract surgery    . CHOLECYSTECTOMY  1955  . colon polyp removal  12/2012  . ESOPHAGOGASTRODUODENOSCOPY  2014  . makoplasty Left 06/2012  . parotid gland removal  1984    Prior to Admission medications   Medication Sig Start Date End Date Taking? Authorizing Provider  amLODipine (NORVASC) 2.5 MG tablet  08/19/15   Historical Provider, MD  calcium carbonate (TUMS - DOSED IN MG ELEMENTAL CALCIUM) 500 MG chewable tablet Chew 1 tablet by mouth daily. Reported on 04/30/2015    Historical Provider, MD  Calcium Carbonate-Vitamin D (CALCIUM 500 + D) 500-125 MG-UNIT TABS Take by mouth.    Historical Provider, MD  ciprofloxacin (CIPRO) 500 MG tablet Reported on 08/05/2015 09/11/14   Historical Provider, MD  cyanocobalamin 1000 MCG tablet Take 1,000 mcg by mouth daily.    Historical Provider, MD  DULoxetine (CYMBALTA) 30 MG capsule Take 30 mg by mouth daily.    Historical Provider, MD  etodolac (LODINE) 400 MG tablet Take 400 mg by mouth 2 (two) times daily. 09/07/14   Historical Provider, MD  fluticasone (FLONASE) 50 MCG/ACT nasal spray Place into the nose. 07/21/15   Historical Provider, MD  levothyroxine (SYNTHROID, LEVOTHROID) 50 MCG tablet Take 50 mcg by mouth daily before breakfast.    Historical Provider, MD  meclizine (ANTIVERT) 12.5 MG tablet Take by mouth. 07/21/15   Historical Provider, MD  Multiple Vitamins-Minerals (MULTIVITAMIN ADULT PO) Take by mouth daily.    Historical Provider, MD  omeprazole (PRILOSEC) 20 MG capsule Take 20 mg by mouth daily.    Historical Provider, MD  rOPINIRole (REQUIP) 0.25 MG tablet Take 0.5 mg by mouth at bedtime.     Historical Provider, MD  simvastatin (ZOCOR) 20 MG tablet Take 20 mg by mouth daily.    Historical Provider, MD  traMADol (ULTRAM) 50 MG tablet Take by mouth. 02/10/15   Historical Provider, MD  VESICARE 5 MG  tablet Take 1 tablet (5 mg total) by mouth daily. 10/30/15   Nori Riis, PA-C    Allergies Codeine sulfate  Family History  Problem Relation Age of Onset  . Breast cancer Daughter   . Colon cancer Maternal Grandfather   . Hypertension Father   . Dementia Father   . Arthritis Mother   . Osteoporosis Mother   . Kidney disease Neg Hx   . Bladder Cancer Neg Hx     Social History Social History  Substance Use Topics  . Smoking status: Former Research scientist (life sciences)  . Smokeless tobacco: Not on file     Comment: quit at age 7  . Alcohol use No     Comment: occasional    Review of Systems Patient denies headaches, rhinorrhea, blurry vision, numbness, shortness of breath, chest pain, edema, cough, abdominal pain, nausea, vomiting, diarrhea, dysuria, fevers, rashes or hallucinations unless otherwise stated above in HPI. ____________________________________________   PHYSICAL EXAM:  VITAL SIGNS: Vitals:   12/18/15 2141 12/18/15 2217  BP: (!) 199/118 (!) 202/143  Pulse: 87 88  Resp: 20 17  Temp: 97.9 F (36.6 C)     Constitutional: Alert and oriented. Eyes: Conjunctivae are normal. PERRL. EOMI. Head: Large ecchymosis and swelling to the right superior periorbital region. No proptosis. No conjunctival injection. Nose: No congestion/rhinnorhea. Mouth/Throat: Mucous membranes are moist.  Oropharynx non-erythematous. Neck: No stridor. Painless ROM. + cervical spine tenderness to palpation Hematological/Lymphatic/Immunilogical: No cervical lymphadenopathy. Cardiovascular: Normal rate, regular rhythm. Grossly normal heart sounds.  Good peripheral circulation. Respiratory: Normal respiratory effort.  No retractions. Lungs CTAB. Gastrointestinal: Soft and nontender. No distention. No abdominal bruits. No CVA tenderness. Genitourinary:  Musculoskeletal: No lower extremity tenderness nor edema.  No joint effusions. Neurologic:  Normal speech and language. No gross focal neurologic deficits  are appreciated. No gait instability. Skin:  Skin is warm, dry and intact. No rash noted. Psychiatric: Mood and affect are normal. Speech and behavior are normal.  ____________________________________________   LABS (all labs ordered are listed, but only abnormal results are displayed)  Results for orders placed or performed during the hospital encounter of 12/18/15 (from the past 24 hour(s))  CBC     Status: Abnormal   Collection Time: 12/18/15  9:47 PM  Result Value Ref Range   WBC 6.9 3.6 - 11.0 K/uL   RBC 4.94 3.80 - 5.20 MIL/uL   Hemoglobin 14.5 12.0 - 16.0 g/dL   HCT 42.4 35.0 - 47.0 %   MCV 85.9 80.0 - 100.0 fL   MCH 29.4 26.0 - 34.0 pg   MCHC 34.2 32.0 - 36.0 g/dL   RDW 13.7 11.5 - 14.5 %   Platelets 128 (L) 150 - 440 K/uL  Basic metabolic panel     Status: Abnormal   Collection Time: 12/18/15  9:47 PM  Result Value Ref Range   Sodium 136 135 - 145  mmol/L   Potassium 3.5 3.5 - 5.1 mmol/L   Chloride 103 101 - 111 mmol/L   CO2 24 22 - 32 mmol/L   Glucose, Bld 123 (H) 65 - 99 mg/dL   BUN 20 6 - 20 mg/dL   Creatinine, Ser 0.89 0.44 - 1.00 mg/dL   Calcium 8.9 8.9 - 10.3 mg/dL   GFR calc non Af Amer 57 (L) >60 mL/min   GFR calc Af Amer >60 >60 mL/min   Anion gap 9 5 - 15   ____________________________________________   ____________________________________________  RADIOLOGY  CT head with NAICA CT max face/ cervical spine and Chest pending ____________________________________________   PROCEDURES  Procedure(s) performed: none    Critical Care performed: no ____________________________________________   INITIAL IMPRESSION / ASSESSMENT AND PLAN / ED COURSE  Pertinent labs & imaging results that were available during my care of the patient were reviewed by me and considered in my medical decision making (see chart for details).  DDX: Fracture, contusion, subarachnoid, subdural, concussion,  Halston B Leibovitz is a 82 y.o. who presents to the ED with  acute head injury after mechanical fall. Patient arrives afebrile and hypertensive. No focal neurologic deficits on exam. Does have significant swelling to the right forehead. Extraocular motions are intact. CT imaging of the head was ordered based on mechanism from triage shows no intracranial abnormality per my exam I am concerned for fracture of the maxillofacial structures as well as neck and chest given complaint of neck pain and right-sided chest wall pain. Laboratory evaluation is unremarkable. Patient otherwise stable. Patient will be signed out to Dr. Dahlia Client pending Results of CT imaging.  Clinical Course     ____________________________________________   FINAL CLINICAL IMPRESSION(S) / ED DIAGNOSES  Final diagnoses:  Head injury, initial encounter  Head contusion, initial encounter  Fall, initial encounter  Acute chest wall pain      NEW MEDICATIONS STARTED DURING THIS VISIT:  New Prescriptions   No medications on file     Note:  This document was prepared using Dragon voice recognition software and may include unintentional dictation errors.    Merlyn Lot, MD 12/19/15 [phone]

## 2015-12-18 NOTE — ED Notes (Signed)
[id number] Dominica Severin, son-in-law

## 2015-12-18 NOTE — ED Triage Notes (Signed)
Pt brought in via EMS. Pt states she was walking and tripped over curb, hit head. LARGE hematoma to R side of forehead, above eye. Bruising to R elbow and R hand. Pt states vertigo off and on for 6 weeks.  Hx HTN but not taking anything currently for it.

## 2015-12-18 NOTE — ED Notes (Signed)
Pt returned from CT via stretcher.

## 2015-12-18 NOTE — ED Notes (Signed)
Pt ambulatory to toilet with 1 assistance.

## 2015-12-19 DIAGNOSIS — R0781 Pleurodynia: Secondary | ICD-10-CM | POA: Diagnosis not present

## 2015-12-19 DIAGNOSIS — S0003XA Contusion of scalp, initial encounter: Secondary | ICD-10-CM | POA: Diagnosis not present

## 2015-12-19 DIAGNOSIS — S0083XA Contusion of other part of head, initial encounter: Secondary | ICD-10-CM | POA: Diagnosis not present

## 2015-12-19 MED ORDER — ONDANSETRON HCL 4 MG/2ML IJ SOLN
4.0000 mg | Freq: Once | INTRAMUSCULAR | Status: AC
  Administered 2015-12-19: 4 mg via INTRAVENOUS

## 2015-12-19 MED ORDER — ONDANSETRON HCL 4 MG/2ML IJ SOLN
INTRAMUSCULAR | Status: AC
  Administered 2015-12-19: 4 mg via INTRAVENOUS
  Filled 2015-12-19: qty 2

## 2015-12-19 MED ORDER — ONDANSETRON 4 MG PO TBDP: 4.0000 mg | ORAL_TABLET | Freq: Three times a day (TID) | ORAL | 0 refills | Status: DC | PRN

## 2015-12-19 MED ORDER — IBUPROFEN 800 MG PO TABS: 800.0000 mg | ORAL_TABLET | Freq: Three times a day (TID) | ORAL | 0 refills | Status: DC | PRN

## 2015-12-19 NOTE — ED Provider Notes (Signed)
-----------------------------------------   2:01 AM on 12/19/2015 -----------------------------------------   Blood pressure (!) 182/120, pulse 92, temperature 97.9 F (36.6 C), resp. rate (!) 22, height 5\' 2"  (1.575 m), weight 154 lb (69.9 kg), SpO2 94 %.  Assuming care from Dr. Quentin Cornwall.  In short, Kaitlyn Ramos is a 82 y.o. female with a chief complaint of Head Injury and Fall .  Refer to the original H&P for additional details.  The current plan of care is to follow up the results of the CT cervical spine and CT maxillofacial.  CT maxillofacial and cervical spine 1. No evidence of fracture or dislocation with regard to the maxillofacial structures. 2. No evidence of fracture or subluxation along the cervical spine. 3. Prominent scalp hematoma overlying the right frontal calvarium, extending mildly about the right orbit. 4. Mild degenerative change along the cervical spine, with partial osseous fusion at C4-C 5. 5. Moderate cortical volume loss noted.  The patient did receive some zofran for nausea. She will be discharged to home.   Loney Hering, MD 12/19/15 [phone]

## 2015-12-22 ENCOUNTER — Ambulatory Visit: Payer: Medicare Other | Admitting: Urology

## 2015-12-23 ENCOUNTER — Encounter: Payer: Self-pay | Admitting: Urology

## 2015-12-23 DIAGNOSIS — Z85820 Personal history of malignant melanoma of skin: Secondary | ICD-10-CM | POA: Diagnosis not present

## 2015-12-23 DIAGNOSIS — S22060D Wedge compression fracture of T7-T8 vertebra, subsequent encounter for fracture with routine healing: Secondary | ICD-10-CM | POA: Diagnosis not present

## 2015-12-23 DIAGNOSIS — Z08 Encounter for follow-up examination after completed treatment for malignant neoplasm: Secondary | ICD-10-CM | POA: Diagnosis not present

## 2015-12-23 DIAGNOSIS — R233 Spontaneous ecchymoses: Secondary | ICD-10-CM | POA: Diagnosis not present

## 2016-01-04 DIAGNOSIS — R2681 Unsteadiness on feet: Secondary | ICD-10-CM | POA: Diagnosis not present

## 2016-01-04 DIAGNOSIS — M6281 Muscle weakness (generalized): Secondary | ICD-10-CM | POA: Diagnosis not present

## 2016-01-05 DIAGNOSIS — M6281 Muscle weakness (generalized): Secondary | ICD-10-CM | POA: Diagnosis not present

## 2016-01-05 DIAGNOSIS — R2681 Unsteadiness on feet: Secondary | ICD-10-CM | POA: Diagnosis not present

## 2016-01-07 ENCOUNTER — Ambulatory Visit: Payer: Medicare Other | Admitting: Urology

## 2016-01-11 DIAGNOSIS — R2681 Unsteadiness on feet: Secondary | ICD-10-CM | POA: Diagnosis not present

## 2016-01-11 DIAGNOSIS — M6281 Muscle weakness (generalized): Secondary | ICD-10-CM | POA: Diagnosis not present

## 2016-01-13 ENCOUNTER — Ambulatory Visit (INDEPENDENT_AMBULATORY_CARE_PROVIDER_SITE_OTHER): Payer: Medicare Other | Admitting: Urology

## 2016-01-13 VITALS — BP 127/89 | HR 106 | Ht 62.0 in | Wt 154.0 lb

## 2016-01-13 DIAGNOSIS — N390 Urinary tract infection, site not specified: Secondary | ICD-10-CM | POA: Diagnosis not present

## 2016-01-13 DIAGNOSIS — N3941 Urge incontinence: Secondary | ICD-10-CM

## 2016-01-13 LAB — URINALYSIS, COMPLETE
Bilirubin, UA: POSITIVE — AB
Glucose, UA: NEGATIVE
Ketones, UA: NEGATIVE
NITRITE UA: POSITIVE — AB
PH UA: 7 (ref 5.0–7.5)
Specific Gravity, UA: 1.01 (ref 1.005–1.030)
Urobilinogen, Ur: 0.2 mg/dL (ref 0.2–1.0)

## 2016-01-13 LAB — MICROSCOPIC EXAMINATION: WBC, UA: >30 /hpf — AB (ref 0–?)

## 2016-01-13 NOTE — Progress Notes (Signed)
PTNS  Session # Maint. #11  Health & Social Factors: no change Caffeine: 0 Alcohol: 0 Daytime voids #per day: 3-4 Night-time voids #per night: 2-3 Urgency: severe Incontinence Episodes #per day: 2 Ankle used: right Treatment Setting: 5 Feeling/ Response: sensory Comments: n/a  Preformed By: Zara Council, PAC  Assistant: Fonnie Jarvis, CMA  Follow Up: 1 month

## 2016-01-14 DIAGNOSIS — R2681 Unsteadiness on feet: Secondary | ICD-10-CM | POA: Diagnosis not present

## 2016-01-14 DIAGNOSIS — M6281 Muscle weakness (generalized): Secondary | ICD-10-CM | POA: Diagnosis not present

## 2016-01-15 ENCOUNTER — Telehealth: Payer: Self-pay | Admitting: Urology

## 2016-01-15 ENCOUNTER — Encounter: Payer: Self-pay | Admitting: Urology

## 2016-01-15 DIAGNOSIS — N390 Urinary tract infection, site not specified: Secondary | ICD-10-CM

## 2016-01-15 DIAGNOSIS — M6281 Muscle weakness (generalized): Secondary | ICD-10-CM | POA: Diagnosis not present

## 2016-01-15 DIAGNOSIS — R2681 Unsteadiness on feet: Secondary | ICD-10-CM | POA: Diagnosis not present

## 2016-01-15 NOTE — Telephone Encounter (Signed)
Kaitlyn Ramos's urine did not get sent for culture.  I was not aware of UA results until Friday evening when the office was closed.  Please call the patient and ask her to provide another specimen and please apologize for the inconvenience.

## 2016-01-15 NOTE — Progress Notes (Signed)
8:16 PM   Kaitlyn Ramos June 16, 1927 [id number]  Referring provider: Maryland Pink, MD [address] [facility] [place], Kaitlyn Ramos [id number]  Chief Complaint  Patient presents with  . Urinary Incontinence    Maint. PTNS     HPI: Kaitlyn Ramos  is an 82 year old Caucasian female who suffers with urge incontinence who presents today for PTNS treatment. This is a maintenance treatment.  Patient has been suffering with urge incontinence for last few years. She does not recall a specific event that set off the incontinence.  She has been experiencing a decrease in her incontinence and urgency since starting the PTNS.  After her last treatment,  she is currently experiencing 2 (stable) leakage episode a day. She is experiencing a daytime frequency of 4 (stable) trips to the restroom daily.  She admits to nocturia 2 to 3 times nightly (worsening). She does not have pain or burning with urination. She has a mild urge to urinate. She uses poise pads and finding them dry more often.  She is still wearing depends at night for security.  She does not consume alcohol and she drinks 2 caffeinated beverages daily.  She has tried fluid restrictions and Kegel exercises without improvement of her urinary symptoms when she presented to [place] in February, she had a trial of Myrbetriq which she found ineffective. She has restarted the Vesicare.    She does not have any contraindications presents for PTNS, such as: Pacemaker, implantable defibrillator, history of abnormal bleeding, history of neuropathy or nerve damage or pregnancy.    We discussed the possible complication of the procedure, such as: Discomfort, bleeding at the insertion/stimulation site and procedure consent is signed.  Her goals for therapy are reduced urgency and no accidents.  She has recently suffered a severe fall causing bruising to her face.  She feels that she may be experiencing an UTI.  She has noted an increase in her  frequency, urgency, nocturia and leakage of urine. She denies dysuria, gross hematuria or suprapubic pain. She also denies fevers, chills, nausea and vomiting.  Blood pressure 127/89, pulse (!) 106, height 5\' 2"  (1.575 m), weight 154 lb (69.9 kg).  ROS UROLOGY Frequent Urination?: Yes Hard to postpone urination?: Yes Burning/pain with urination?: No Get up at night to urinate?: Yes Leakage of urine?: Yes Urine stream starts and stops?: No Trouble starting stream?: No Do you have to strain to urinate?: No Blood in urine?: No Urinary tract infection?: No Sexually transmitted disease?: No Injury to kidneys or bladder?: No Painful intercourse?: No Weak stream?: No Currently pregnant?: No Vaginal bleeding?: No Last menstrual period?: n Gastrointestinal Nausea?: No Vomiting?: No Indigestion/heartburn?: No Diarrhea?: No Constipation?: Yes Constitutional Fever: No Night sweats?: No Weight loss?: No Fatigue?: No Skin Skin rash/lesions?: No Itching?: No Eyes Blurred vision?: No Double vision?: No Ears/Nose/Throat Sore throat?: No Sinus problems?: No Hematologic/Lymphatic Swollen glands?: No Easy bruising?: No Cardiovascular Leg swelling?: No Chest pain?: No Respiratory Cough?: No Shortness of breath?: No Endocrine Excessive thirst?: No Musculoskeletal Back pain?: No Joint pain?: No Neurological Headaches?: No Dizziness?: Yes Psychologic Depression?: No Anxiety?: No  Urinalysis Significant for >30 WBC's and nitrite positive.  See EPIC.    PTNS treatment: The patient's right ankle is cleansed with rubbing alcohol. The needle electrode was inserted into the lower, inner aspect of patient's right leg. A surface electrode was placed on the inside arch of the foot on the treatment leg. The lead set was connected to the  stimulator and the needle electrode clip was connected to the needle electrode. The stimulator that produces an adjustable electrical pulse that  travels to sacral nerve plexus via the tibial and nerve was increased to 5 and patient received a sensory response and a toe flex to the stimulus. She tolerated the procedure well for 30 minutes.     Assessment & Plan:    1. Urge incontinence-  Patient feels that she is responding well to the PTNS treatment.  She will RTC in 6 weeks for her maintenance PTNS.    2. UTI  - patient could not provide an UA at the time of visit  - UA was suspicious for infection  - need to contact patient to RTC for UA for culture  RTC in 6 weeks for PTNS  Kaitlyn Council, PA-C  [place] [address] [place],  [address] [phone]

## 2016-01-19 DIAGNOSIS — R2681 Unsteadiness on feet: Secondary | ICD-10-CM | POA: Diagnosis not present

## 2016-01-19 DIAGNOSIS — M6281 Muscle weakness (generalized): Secondary | ICD-10-CM | POA: Diagnosis not present

## 2016-01-19 NOTE — Telephone Encounter (Signed)
Would you call in an antibiotic for her?  Augmentin 875/125, twice daily for seven days.

## 2016-01-19 NOTE — Telephone Encounter (Signed)
Patient notified and is having vertigo. She is unable to drive. She is living at [facility] and will see if she could get a sterile cup and have someone to bring it here.

## 2016-01-20 ENCOUNTER — Other Ambulatory Visit: Payer: Medicare Other

## 2016-01-20 DIAGNOSIS — N390 Urinary tract infection, site not specified: Secondary | ICD-10-CM | POA: Diagnosis not present

## 2016-01-20 MED ORDER — AMOXICILLIN-POT CLAVULANATE 875-125 MG PO TABS: 1.0000 | ORAL_TABLET | Freq: Two times a day (BID) | ORAL | 0 refills | Status: AC

## 2016-01-20 NOTE — Telephone Encounter (Signed)
LMOM- medication sent to pharmacy. May need to change abx once ucx results are back.

## 2016-01-21 DIAGNOSIS — R2681 Unsteadiness on feet: Secondary | ICD-10-CM | POA: Diagnosis not present

## 2016-01-21 DIAGNOSIS — M6281 Muscle weakness (generalized): Secondary | ICD-10-CM | POA: Diagnosis not present

## 2016-01-21 LAB — URINALYSIS, COMPLETE
Bilirubin, UA: POSITIVE — AB
GLUCOSE, UA: NEGATIVE
KETONES UA: NEGATIVE
NITRITE UA: NEGATIVE
PROTEIN UA: NEGATIVE
RBC, UA: NEGATIVE
SPEC GRAV UA: 1.01 (ref 1.005–1.030)
UUROB: 0.2 mg/dL (ref 0.2–1.0)
pH, UA: 5.5 (ref 5.0–7.5)

## 2016-01-21 LAB — MICROSCOPIC EXAMINATION: RBC MICROSCOPIC, UA: NONE SEEN /HPF (ref 0–?)

## 2016-01-23 LAB — CULTURE, URINE COMPREHENSIVE

## 2016-01-25 ENCOUNTER — Telehealth: Payer: Self-pay

## 2016-01-25 NOTE — Telephone Encounter (Signed)
-----   Message from Nori Riis, PA-C sent at 01/24/2016  5:01 PM EDT ----- Patient has a +UCx.  They need to continue Augmentin 875/125 mg.  They also need to take a probiotic with the antibiotic course.  The dosage is listed below:  L. acidophilus and L. casei (25 x 109 CFU/day for 2 days, then 50 x 109 CFU/day for duration of the antibiotic course)

## 2016-01-25 NOTE — Telephone Encounter (Signed)
Spoke with pt in reference +ucx. Made aware to keep taking abx and to start probiotic. Pt voiced understanding of whole conversation.

## 2016-01-26 DIAGNOSIS — M6281 Muscle weakness (generalized): Secondary | ICD-10-CM | POA: Diagnosis not present

## 2016-01-26 DIAGNOSIS — R2681 Unsteadiness on feet: Secondary | ICD-10-CM | POA: Diagnosis not present

## 2016-01-28 DIAGNOSIS — R2681 Unsteadiness on feet: Secondary | ICD-10-CM | POA: Diagnosis not present

## 2016-01-28 DIAGNOSIS — M6281 Muscle weakness (generalized): Secondary | ICD-10-CM | POA: Diagnosis not present

## 2016-01-29 DIAGNOSIS — R2681 Unsteadiness on feet: Secondary | ICD-10-CM | POA: Diagnosis not present

## 2016-01-29 DIAGNOSIS — M6281 Muscle weakness (generalized): Secondary | ICD-10-CM | POA: Diagnosis not present

## 2016-01-30 ENCOUNTER — Other Ambulatory Visit: Payer: Self-pay | Admitting: Urology

## 2016-01-30 DIAGNOSIS — N3281 Overactive bladder: Secondary | ICD-10-CM

## 2016-02-01 DIAGNOSIS — M6281 Muscle weakness (generalized): Secondary | ICD-10-CM | POA: Diagnosis not present

## 2016-02-01 DIAGNOSIS — R2681 Unsteadiness on feet: Secondary | ICD-10-CM | POA: Diagnosis not present

## 2016-02-03 DIAGNOSIS — M6281 Muscle weakness (generalized): Secondary | ICD-10-CM | POA: Diagnosis not present

## 2016-02-03 DIAGNOSIS — R2681 Unsteadiness on feet: Secondary | ICD-10-CM | POA: Diagnosis not present

## 2016-02-05 DIAGNOSIS — R2681 Unsteadiness on feet: Secondary | ICD-10-CM | POA: Diagnosis not present

## 2016-02-05 DIAGNOSIS — M6281 Muscle weakness (generalized): Secondary | ICD-10-CM | POA: Diagnosis not present

## 2016-02-08 ENCOUNTER — Ambulatory Visit: Payer: Medicare Other | Admitting: Urology

## 2016-02-08 DIAGNOSIS — R2681 Unsteadiness on feet: Secondary | ICD-10-CM | POA: Diagnosis not present

## 2016-02-08 DIAGNOSIS — M6281 Muscle weakness (generalized): Secondary | ICD-10-CM | POA: Diagnosis not present

## 2016-02-09 ENCOUNTER — Encounter: Payer: Self-pay | Admitting: Urology

## 2016-02-09 ENCOUNTER — Telehealth: Payer: Self-pay | Admitting: Urology

## 2016-02-09 ENCOUNTER — Ambulatory Visit (INDEPENDENT_AMBULATORY_CARE_PROVIDER_SITE_OTHER): Payer: Medicare Other | Admitting: Urology

## 2016-02-09 VITALS — BP 174/92 | HR 66 | Ht 62.0 in | Wt 155.9 lb

## 2016-02-09 DIAGNOSIS — R3915 Urgency of urination: Secondary | ICD-10-CM

## 2016-02-09 DIAGNOSIS — N3941 Urge incontinence: Secondary | ICD-10-CM

## 2016-02-09 LAB — PTNS-PERCUTANEOUS TIBIAL NERVE STIMULATION: SCAN RESULT: 2

## 2016-02-09 NOTE — Telephone Encounter (Signed)
When I called pt to let her know about her follow up appt, she wanted to let you know that since she finished taking her amoxicillin, she felt wonderf

## 2016-02-09 NOTE — Progress Notes (Signed)
8:43 AM   Kaitlyn Ramos 12/13/1927 [id number]  Referring provider: Maryland Pink, MD [address] [facility] [place], [place] [address]  Chief Complaint  Patient presents with  . Urinary Incontinence    PTNS    HPI: Kaitlyn Ramos  is an 82 year old Caucasian female who suffers with urge incontinence who presents today for PTNS treatment. This is a maintenance treatment.  Patient has been suffering with urge incontinence for last few years. She does not recall a specific event that set off the incontinence.  She has been experiencing a decrease in her incontinence and urgency since starting the PTNS.  After her last treatment,  she is currently experiencing 0 (improved) leakage episode a day. She is experiencing a daytime frequency of 4 (stable) trips to the restroom daily.  She admits to nocturia 1 to 2 times nightly (improved). She does not have pain or burning with urination. She has a mild urge to urinate. She uses poise pads and finding them dry more often.  She is still wearing depends at night for security.  She does not consume alcohol and she drinks 2 caffeinated beverages daily.  She has tried fluid restrictions and Kegel exercises without improvement of her urinary symptoms when she presented to [place] in February, she had a trial of Myrbetriq which she found ineffective. She has restarted the Vesicare.    She does not have any contraindications presents for PTNS, such as: Pacemaker, implantable defibrillator, history of abnormal bleeding, history of neuropathy or nerve damage or pregnancy.    We discussed the possible complication of the procedure, such as: Discomfort, bleeding at the insertion/stimulation site and procedure consent is signed.  Her goals for therapy are reduced urgency and no accidents.   Blood pressure (!) 174/92, pulse 66, height 5\' 2"  (1.575 m), weight 155 lb 14.4 oz (70.7 kg).  Constitutional: Well nourished. Alert and oriented, No acute  distress. HEENT: [place] AT, moist mucus membranes. Trachea midline, no masses. Cardiovascular: No clubbing, cyanosis, or edema. Respiratory: Normal respiratory effort, no increased work of breathing. Skin: No rashes, bruises or suspicious lesions. Lymph: No cervical or inguinal adenopathy. Neurologic: Grossly intact, no focal deficits, moving all 4 extremities. Psychiatric: Normal mood and affect.  ROS UROLOGY Frequent Urination?: No Hard to postpone urination?: No Burning/pain with urination?: No Get up at night to urinate?: Yes Leakage of urine?: Yes Urine stream starts and stops?: No Trouble starting stream?: No Do you have to strain to urinate?: No Blood in urine?: No Urinary tract infection?: Yes Sexually transmitted disease?: No Injury to kidneys or bladder?: No Painful intercourse?: No Weak stream?: No Currently pregnant?: No Vaginal bleeding?: No Last menstrual period?: n Gastrointestinal Nausea?: No Vomiting?: No Indigestion/heartburn?: No Diarrhea?: No Constipation?: Yes Constitutional Fever: No Night sweats?: No Weight loss?: No Fatigue?: No Skin Skin rash/lesions?: No Itching?: No Eyes Blurred vision?: No Double vision?: No Ears/Nose/Throat Sore throat?: No Sinus problems?: No Hematologic/Lymphatic Swollen glands?: No Easy bruising?: No Cardiovascular Leg swelling?: No Chest pain?: No Respiratory Cough?: No Shortness of breath?: No Endocrine Excessive thirst?: No Musculoskeletal Back pain?: No Joint pain?: No Neurological Headaches?: No Dizziness?: Yes Psychologic Depression?: No Anxiety?: No      Assessment & Plan:    1. Urge incontinence-  Patient feels that she is responding well to the PTNS treatment.  She will RTC in 6 weeks for her maintenance PTNS.     PTNS treatment: The patient's right ankle is cleansed with rubbing alcohol. The  needle electrode was inserted into the lower, inner aspect of patient's right leg.  A surface  electrode was placed on the inside arch of the foot on the treatment leg. The lead set was connected to the stimulator and the needle electrode clip was connected to the needle electrode. The stimulator that produces an adjustable electrical pulse that travels to sacral nerve plexus via the tibial and nerve was increased to 3 and patient received a sensory response and a toe flex to the stimulus. She tolerated the procedure well for 30 minutes.      RTC in 6 weeks for PTNS  Zara Council, [facility] [address] [place], [place] [address] [phone]

## 2016-02-10 DIAGNOSIS — R2681 Unsteadiness on feet: Secondary | ICD-10-CM | POA: Diagnosis not present

## 2016-02-10 DIAGNOSIS — M6281 Muscle weakness (generalized): Secondary | ICD-10-CM | POA: Diagnosis not present

## 2016-02-11 DIAGNOSIS — Z23 Encounter for immunization: Secondary | ICD-10-CM | POA: Diagnosis not present

## 2016-02-12 DIAGNOSIS — M6281 Muscle weakness (generalized): Secondary | ICD-10-CM | POA: Diagnosis not present

## 2016-02-12 DIAGNOSIS — R2681 Unsteadiness on feet: Secondary | ICD-10-CM | POA: Diagnosis not present

## 2016-02-17 DIAGNOSIS — R42 Dizziness and giddiness: Secondary | ICD-10-CM | POA: Diagnosis not present

## 2016-02-17 DIAGNOSIS — R51 Headache: Secondary | ICD-10-CM | POA: Diagnosis not present

## 2016-02-22 DIAGNOSIS — H353132 Nonexudative age-related macular degeneration, bilateral, intermediate dry stage: Secondary | ICD-10-CM | POA: Diagnosis not present

## 2016-03-03 ENCOUNTER — Observation Stay
Admission: EM | Admit: 2016-03-03 | Discharge: 2016-03-06 | Disposition: A | Discharge status: Home / Self Care | Payer: Medicare Other | Attending: Internal Medicine | Admitting: Internal Medicine

## 2016-03-03 ENCOUNTER — Emergency Department: Payer: Medicare Other

## 2016-03-03 DIAGNOSIS — I129 Hypertensive chronic kidney disease with stage 1 through stage 4 chronic kidney disease, or unspecified chronic kidney disease: Secondary | ICD-10-CM | POA: Insufficient documentation

## 2016-03-03 DIAGNOSIS — Z820 Family history of epilepsy and other diseases of the nervous system: Secondary | ICD-10-CM | POA: Insufficient documentation

## 2016-03-03 DIAGNOSIS — K589 Irritable bowel syndrome without diarrhea: Secondary | ICD-10-CM | POA: Insufficient documentation

## 2016-03-03 DIAGNOSIS — Z8262 Family history of osteoporosis: Secondary | ICD-10-CM | POA: Insufficient documentation

## 2016-03-03 DIAGNOSIS — M158 Other polyosteoarthritis: Secondary | ICD-10-CM | POA: Diagnosis not present

## 2016-03-03 DIAGNOSIS — I6782 Cerebral ischemia: Secondary | ICD-10-CM | POA: Diagnosis not present

## 2016-03-03 DIAGNOSIS — Z9049 Acquired absence of other specified parts of digestive tract: Secondary | ICD-10-CM | POA: Insufficient documentation

## 2016-03-03 DIAGNOSIS — N183 Chronic kidney disease, stage 3 (moderate): Secondary | ICD-10-CM | POA: Diagnosis not present

## 2016-03-03 DIAGNOSIS — M159 Polyosteoarthritis, unspecified: Secondary | ICD-10-CM | POA: Insufficient documentation

## 2016-03-03 DIAGNOSIS — R0601 Orthopnea: Secondary | ICD-10-CM | POA: Insufficient documentation

## 2016-03-03 DIAGNOSIS — R296 Repeated falls: Secondary | ICD-10-CM | POA: Insufficient documentation

## 2016-03-03 DIAGNOSIS — E7801 Familial hypercholesterolemia: Secondary | ICD-10-CM | POA: Diagnosis not present

## 2016-03-03 DIAGNOSIS — R2681 Unsteadiness on feet: Secondary | ICD-10-CM

## 2016-03-03 DIAGNOSIS — Z87440 Personal history of urinary (tract) infections: Secondary | ICD-10-CM | POA: Insufficient documentation

## 2016-03-03 DIAGNOSIS — Z885 Allergy status to narcotic agent status: Secondary | ICD-10-CM | POA: Insufficient documentation

## 2016-03-03 DIAGNOSIS — I493 Ventricular premature depolarization: Secondary | ICD-10-CM | POA: Diagnosis not present

## 2016-03-03 DIAGNOSIS — M542 Cervicalgia: Secondary | ICD-10-CM | POA: Insufficient documentation

## 2016-03-03 DIAGNOSIS — R3129 Other microscopic hematuria: Secondary | ICD-10-CM | POA: Insufficient documentation

## 2016-03-03 DIAGNOSIS — E039 Hypothyroidism, unspecified: Secondary | ICD-10-CM | POA: Insufficient documentation

## 2016-03-03 DIAGNOSIS — G4733 Obstructive sleep apnea (adult) (pediatric): Secondary | ICD-10-CM | POA: Insufficient documentation

## 2016-03-03 DIAGNOSIS — S62625A Displaced fracture of medial phalanx of left ring finger, initial encounter for closed fracture: Secondary | ICD-10-CM | POA: Diagnosis not present

## 2016-03-03 DIAGNOSIS — Y92039 Unspecified place in apartment as the place of occurrence of the external cause: Secondary | ICD-10-CM | POA: Diagnosis not present

## 2016-03-03 DIAGNOSIS — W19XXXA Unspecified fall, initial encounter: Secondary | ICD-10-CM | POA: Diagnosis not present

## 2016-03-03 DIAGNOSIS — Z8261 Family history of arthritis: Secondary | ICD-10-CM | POA: Insufficient documentation

## 2016-03-03 DIAGNOSIS — N3941 Urge incontinence: Secondary | ICD-10-CM | POA: Diagnosis not present

## 2016-03-03 DIAGNOSIS — Y939 Activity, unspecified: Secondary | ICD-10-CM | POA: Diagnosis not present

## 2016-03-03 DIAGNOSIS — Z8614 Personal history of Methicillin resistant Staphylococcus aureus infection: Secondary | ICD-10-CM | POA: Diagnosis not present

## 2016-03-03 DIAGNOSIS — M810 Age-related osteoporosis without current pathological fracture: Secondary | ICD-10-CM | POA: Insufficient documentation

## 2016-03-03 DIAGNOSIS — G4730 Sleep apnea, unspecified: Secondary | ICD-10-CM | POA: Diagnosis not present

## 2016-03-03 DIAGNOSIS — G2581 Restless legs syndrome: Secondary | ICD-10-CM | POA: Diagnosis not present

## 2016-03-03 DIAGNOSIS — Z800 Family history of malignant neoplasm of digestive organs: Secondary | ICD-10-CM | POA: Insufficient documentation

## 2016-03-03 DIAGNOSIS — F329 Major depressive disorder, single episode, unspecified: Secondary | ICD-10-CM | POA: Insufficient documentation

## 2016-03-03 DIAGNOSIS — Z8249 Family history of ischemic heart disease and other diseases of the circulatory system: Secondary | ICD-10-CM | POA: Insufficient documentation

## 2016-03-03 DIAGNOSIS — R531 Weakness: Secondary | ICD-10-CM | POA: Insufficient documentation

## 2016-03-03 DIAGNOSIS — M7540 Impingement syndrome of unspecified shoulder: Secondary | ICD-10-CM | POA: Diagnosis not present

## 2016-03-03 DIAGNOSIS — Z803 Family history of malignant neoplasm of breast: Secondary | ICD-10-CM | POA: Insufficient documentation

## 2016-03-03 DIAGNOSIS — E782 Mixed hyperlipidemia: Secondary | ICD-10-CM | POA: Insufficient documentation

## 2016-03-03 DIAGNOSIS — K5190 Ulcerative colitis, unspecified, without complications: Secondary | ICD-10-CM | POA: Diagnosis not present

## 2016-03-03 DIAGNOSIS — Z808 Family history of malignant neoplasm of other organs or systems: Secondary | ICD-10-CM | POA: Insufficient documentation

## 2016-03-03 DIAGNOSIS — I10 Essential (primary) hypertension: Secondary | ICD-10-CM | POA: Diagnosis not present

## 2016-03-03 DIAGNOSIS — I951 Orthostatic hypotension: Secondary | ICD-10-CM | POA: Diagnosis not present

## 2016-03-03 DIAGNOSIS — Z8661 Personal history of infections of the central nervous system: Secondary | ICD-10-CM | POA: Diagnosis not present

## 2016-03-03 DIAGNOSIS — R55 Syncope and collapse: Secondary | ICD-10-CM | POA: Diagnosis not present

## 2016-03-03 DIAGNOSIS — Z87891 Personal history of nicotine dependence: Secondary | ICD-10-CM | POA: Insufficient documentation

## 2016-03-03 DIAGNOSIS — S0990XA Unspecified injury of head, initial encounter: Secondary | ICD-10-CM | POA: Diagnosis not present

## 2016-03-03 DIAGNOSIS — R42 Dizziness and giddiness: Secondary | ICD-10-CM | POA: Insufficient documentation

## 2016-03-03 DIAGNOSIS — Z79899 Other long term (current) drug therapy: Secondary | ICD-10-CM | POA: Insufficient documentation

## 2016-03-03 DIAGNOSIS — Z90710 Acquired absence of both cervix and uterus: Secondary | ICD-10-CM | POA: Insufficient documentation

## 2016-03-03 DIAGNOSIS — I080 Rheumatic disorders of both mitral and aortic valves: Secondary | ICD-10-CM | POA: Insufficient documentation

## 2016-03-03 LAB — CBC WITH DIFFERENTIAL/PLATELET
Basophils Absolute: 0 10*3/uL (ref 0–0.1)
Basophils Relative: 1 %
EOS PCT: 1 %
Eosinophils Absolute: 0.1 10*3/uL (ref 0–0.7)
HEMATOCRIT: 42.9 % (ref 35.0–47.0)
HEMOGLOBIN: 14.3 g/dL (ref 12.0–16.0)
LYMPHS ABS: 0.9 10*3/uL — AB (ref 1.0–3.6)
LYMPHS PCT: 13 %
MCH: 28.9 pg (ref 26.0–34.0)
MCHC: 33.4 g/dL (ref 32.0–36.0)
MCV: 86.5 fL (ref 80.0–100.0)
Monocytes Absolute: 0.5 10*3/uL (ref 0.2–0.9)
Monocytes Relative: 6 %
NEUTROS ABS: 5.9 10*3/uL (ref 1.4–6.5)
NEUTROS PCT: 79 %
Platelets: 135 10*3/uL — ABNORMAL LOW (ref 150–440)
RBC: 4.96 MIL/uL (ref 3.80–5.20)
RDW: 14 % (ref 11.5–14.5)
WBC: 7.4 10*3/uL (ref 3.6–11.0)

## 2016-03-03 LAB — COMPREHENSIVE METABOLIC PANEL
ALK PHOS: 57 U/L (ref 38–126)
ALT: 15 U/L (ref 14–54)
AST: 22 U/L (ref 15–41)
Albumin: 4.5 g/dL (ref 3.5–5.0)
Anion gap: 8 (ref 5–15)
BILIRUBIN TOTAL: 1.1 mg/dL (ref 0.3–1.2)
BUN: 16 mg/dL (ref 6–20)
CALCIUM: 9.5 mg/dL (ref 8.9–10.3)
CO2: 27 mmol/L (ref 22–32)
CREATININE: 1.01 mg/dL — AB (ref 0.44–1.00)
Chloride: 103 mmol/L (ref 101–111)
GFR, EST AFRICAN AMERICAN: 57 mL/min — AB (ref >60)
GFR, EST NON AFRICAN AMERICAN: 49 mL/min — AB (ref >60)
Glucose, Bld: 103 mg/dL — ABNORMAL HIGH (ref 65–99)
Potassium: 4.2 mmol/L (ref 3.5–5.1)
Sodium: 138 mmol/L (ref 135–145)
Total Protein: 7.7 g/dL (ref 6.5–8.1)

## 2016-03-03 LAB — URINALYSIS COMPLETE WITH MICROSCOPIC (ARMC ONLY)
Bilirubin Urine: NEGATIVE
GLUCOSE, UA: NEGATIVE mg/dL
Hgb urine dipstick: NEGATIVE
KETONES UR: NEGATIVE mg/dL
Leukocytes, UA: NEGATIVE
NITRITE: NEGATIVE
Protein, ur: NEGATIVE mg/dL
SPECIFIC GRAVITY, URINE: 1.009 (ref 1.005–1.030)
Squamous Epithelial / LPF: NONE SEEN
pH: 7 (ref 5.0–8.0)

## 2016-03-03 LAB — TROPONIN I: Troponin I: <0.03 ng/mL (ref <0.03)

## 2016-03-03 LAB — TSH: TSH: 1.693 u[IU]/mL (ref 0.350–4.500)

## 2016-03-03 MED ORDER — VITAMIN B-12 1000 MCG PO TABS
1000.0000 ug | ORAL_TABLET | Freq: Every day | ORAL | Status: DC
  Administered 2016-03-04 – 2016-03-06 (×3): 1000 ug via ORAL
  Filled 2016-03-03 (×3): qty 1

## 2016-03-03 MED ORDER — DARIFENACIN HYDROBROMIDE ER 7.5 MG PO TB24
7.5000 mg | ORAL_TABLET | Freq: Every day | ORAL | Status: DC
  Administered 2016-03-04 – 2016-03-06 (×3): 7.5 mg via ORAL
  Filled 2016-03-03 (×3): qty 1

## 2016-03-03 MED ORDER — LORAZEPAM 2 MG/ML IJ SOLN
1.0000 mg | Freq: Once | INTRAMUSCULAR | Status: AC
  Administered 2016-03-03: 1 mg via INTRAVENOUS

## 2016-03-03 MED ORDER — ETODOLAC 400 MG PO TABS
400.0000 mg | ORAL_TABLET | Freq: Two times a day (BID) | ORAL | Status: DC
  Administered 2016-03-03 – 2016-03-06 (×6): 400 mg via ORAL
  Filled 2016-03-03 (×7): qty 1

## 2016-03-03 MED ORDER — ROPINIROLE HCL 1 MG PO TABS
0.5000 mg | ORAL_TABLET | Freq: Every day | ORAL | Status: DC
  Administered 2016-03-03 – 2016-03-05 (×3): 0.5 mg via ORAL
  Filled 2016-03-03 (×5): qty 1

## 2016-03-03 MED ORDER — LORAZEPAM 2 MG/ML IJ SOLN
INTRAMUSCULAR | Status: AC
  Administered 2016-03-03: 1 mg via INTRAVENOUS
  Filled 2016-03-03: qty 1

## 2016-03-03 MED ORDER — LORAZEPAM 1 MG PO TABS: 1.0000 mg | ORAL_TABLET | Freq: Once | ORAL | Status: DC

## 2016-03-03 MED ORDER — PANTOPRAZOLE SODIUM 40 MG PO TBEC
40.0000 mg | DELAYED_RELEASE_TABLET | Freq: Every day | ORAL | Status: DC
  Administered 2016-03-04 – 2016-03-06 (×3): 40 mg via ORAL
  Filled 2016-03-03 (×3): qty 1

## 2016-03-03 MED ORDER — ENOXAPARIN SODIUM 40 MG/0.4ML ~~LOC~~ SOLN
40.0000 mg | SUBCUTANEOUS | Status: DC
  Administered 2016-03-03 – 2016-03-05 (×3): 40 mg via SUBCUTANEOUS
  Filled 2016-03-03 (×3): qty 0.4

## 2016-03-03 MED ORDER — SIMVASTATIN 20 MG PO TABS
20.0000 mg | ORAL_TABLET | Freq: Every day | ORAL | Status: DC
  Administered 2016-03-03 – 2016-03-06 (×4): 20 mg via ORAL
  Filled 2016-03-03 (×4): qty 1

## 2016-03-03 MED ORDER — SODIUM CHLORIDE 0.9 % IV SOLN
INTRAVENOUS | Status: DC
  Administered 2016-03-03 – 2016-03-05 (×3): via INTRAVENOUS

## 2016-03-03 MED ORDER — CALCIUM CARBONATE ANTACID 500 MG PO CHEW
1.0000 | CHEWABLE_TABLET | Freq: Every day | ORAL | Status: DC
  Administered 2016-03-03 – 2016-03-06 (×4): 200 mg via ORAL
  Filled 2016-03-03 (×4): qty 1

## 2016-03-03 MED ORDER — ADULT MULTIVITAMIN W/MINERALS CH
1.0000 | ORAL_TABLET | Freq: Every day | ORAL | Status: DC
  Administered 2016-03-03 – 2016-03-06 (×4): 1 via ORAL
  Filled 2016-03-03 (×4): qty 1

## 2016-03-03 MED ORDER — DULOXETINE HCL 30 MG PO CPEP
30.0000 mg | ORAL_CAPSULE | Freq: Every day | ORAL | Status: DC
  Administered 2016-03-04 – 2016-03-06 (×3): 30 mg via ORAL
  Filled 2016-03-03 (×3): qty 1

## 2016-03-03 MED ORDER — SODIUM CHLORIDE 0.9 % IV SOLN
INTRAVENOUS | Status: DC
  Administered 2016-03-03 – 2016-03-04 (×2): via INTRAVENOUS

## 2016-03-03 MED ORDER — SODIUM CHLORIDE 0.9% FLUSH
3.0000 mL | Freq: Two times a day (BID) | INTRAVENOUS | Status: DC
  Administered 2016-03-03 – 2016-03-06 (×4): 3 mL via INTRAVENOUS

## 2016-03-03 MED ORDER — SODIUM CHLORIDE 0.9 % IV BOLUS (SEPSIS)
1000.0000 mL | Freq: Once | INTRAVENOUS | Status: AC
  Administered 2016-03-03: 1000 mL via INTRAVENOUS

## 2016-03-03 MED ORDER — LEVOTHYROXINE SODIUM 50 MCG PO TABS
50.0000 ug | ORAL_TABLET | Freq: Every day | ORAL | Status: DC
  Administered 2016-03-04 – 2016-03-06 (×3): 50 ug via ORAL
  Filled 2016-03-03 (×3): qty 1

## 2016-03-03 NOTE — H&P (Signed)
Kaitlyn Ramos is an 82 y.o. female.   Chief Complaint: Passing out HPI: This 82 year old female who is walking with her walker today at [place] and got dizzy and passed out. She denies any chest pain or shortness of breath during the episode. She says she's had multiple episodes of this in falls often. Hearing the ER she is currently orthostatic by blood pressure but not hypotensive.  Past Medical History:  Diagnosis Date  . Cervicalgia   . Depression   . Essential hypertension   . Hemorrhoids   . History of MRSA infection   . Hyperlipidemia   . Hypertension   . Hypothyroidism   . IBS (irritable bowel syndrome)   . Osteoarthritis of multiple joints   . Restless leg   . Senile osteoporosis   . Shingles   . Sleep apnea   . Ulcerative colitis ([place])   . Urge incontinence     Past Surgical History:  Procedure Laterality Date  . ABDOMINAL HYSTERECTOMY  1980  . cataract surgery    . CHOLECYSTECTOMY  1955  . colon polyp removal  12/2012  . ESOPHAGOGASTRODUODENOSCOPY  2014  . makoplasty Left 06/2012  . parotid gland removal  1984    Family History  Problem Relation Age of Onset  . Breast cancer Daughter   . Colon cancer Maternal Grandfather   . Hypertension Father   . Dementia Father   . Arthritis Mother   . Osteoporosis Mother   . Kidney disease Neg Hx   . Bladder Cancer Neg Hx    Social History:  reports that she has quit smoking. She does not have any smokeless tobacco history on file. She reports that she does not drink alcohol or use drugs.  Allergies:  Allergies  Allergen Reactions  . Codeine Sulfate Hives and Itching     (Not in a hospital admission)  Results for orders placed or performed during the hospital encounter of 03/03/16 (from the past 48 hour(s))  CBC with Differential/Platelet     Status: Abnormal   Collection Time: 03/03/16  2:33 PM  Result Value Ref Range   WBC 7.4 3.6 - 11.0 K/uL   RBC 4.96 3.80 - 5.20 MIL/uL   Hemoglobin 14.3 12.0 -  16.0 g/dL   HCT 42.9 35.0 - 47.0 %   MCV 86.5 80.0 - 100.0 fL   MCH 28.9 26.0 - 34.0 pg   MCHC 33.4 32.0 - 36.0 g/dL   RDW 14.0 11.5 - 14.5 %   Platelets 135 (L) 150 - 440 K/uL   Neutrophils Relative % 79 %   Neutro Abs 5.9 1.4 - 6.5 K/uL   Lymphocytes Relative 13 %   Lymphs Abs 0.9 (L) 1.0 - 3.6 K/uL   Monocytes Relative 6 %   Monocytes Absolute 0.5 0.2 - 0.9 K/uL   Eosinophils Relative 1 %   Eosinophils Absolute 0.1 0 - 0.7 K/uL   Basophils Relative 1 %   Basophils Absolute 0.0 0 - 0.1 K/uL  Comprehensive metabolic panel     Status: Abnormal   Collection Time: 03/03/16  2:33 PM  Result Value Ref Range   Sodium 138 135 - 145 mmol/L   Potassium 4.2 3.5 - 5.1 mmol/L   Chloride 103 101 - 111 mmol/L   CO2 27 22 - 32 mmol/L   Glucose, Bld 103 (H) 65 - 99 mg/dL   BUN 16 6 - 20 mg/dL   Creatinine, Ser 1.01 (H) 0.44 - 1.00 mg/dL   Calcium 9.5  8.9 - 10.3 mg/dL   Total Protein 7.7 6.5 - 8.1 g/dL   Albumin 4.5 3.5 - 5.0 g/dL   AST 22 15 - 41 U/L   ALT 15 14 - 54 U/L   Alkaline Phosphatase 57 38 - 126 U/L   Total Bilirubin 1.1 0.3 - 1.2 mg/dL   GFR calc non Af Amer 49 (L) >60 mL/min   GFR calc Af Amer 57 (L) >60 mL/min    Comment: (NOTE) The eGFR has been calculated using the CKD EPI equation. This calculation has not been validated in all clinical situations. eGFR's persistently <60 mL/min signify possible Chronic Kidney Disease.    Anion gap 8 5 - 15  Urinalysis complete, with microscopic ([facility] only)     Status: Abnormal   Collection Time: 03/03/16  2:33 PM  Result Value Ref Range   Color, Urine YELLOW (A) YELLOW   APPearance CLEAR (A) CLEAR   Glucose, UA NEGATIVE NEGATIVE mg/dL   Bilirubin Urine NEGATIVE NEGATIVE   Ketones, ur NEGATIVE NEGATIVE mg/dL   Specific Gravity, Urine 1.009 1.005 - 1.030   Hgb urine dipstick NEGATIVE NEGATIVE   pH 7.0 5.0 - 8.0   Protein, ur NEGATIVE NEGATIVE mg/dL   Nitrite NEGATIVE NEGATIVE   Leukocytes, UA NEGATIVE NEGATIVE   RBC / HPF 0-5  0 - 5 RBC/hpf   WBC, UA 0-5 0 - 5 WBC/hpf   Bacteria, UA RARE (A) NONE SEEN   Squamous Epithelial / LPF NONE SEEN NONE SEEN   Mucous PRESENT   Troponin I     Status: None   Collection Time: 03/03/16  2:33 PM  Result Value Ref Range   Troponin I <0.03 <0.03 ng/mL   Ct Head Wo Contrast  Result Date: 03/03/2016 CLINICAL DATA:  Recurrent falls since July, 2017. Most recent fall occurred today. EXAM: CT HEAD WITHOUT CONTRAST TECHNIQUE: Contiguous axial images were obtained from the base of the skull through the vertex without intravenous contrast. COMPARISON:  Head CT scan 08/07/2015. FINDINGS: Brain: Cortical atrophy and chronic microvascular ischemic change are stable in appearance. No evidence of acute abnormality including hemorrhage, infarct, mass lesion, mass effect, midline shift or abnormal extra-axial fluid collection. No hydrocephalus or pneumocephalus. Vascular: Atherosclerotic vascular disease is noted. Skull: Intact. Sinuses/Orbits: Negative. Other: None. IMPRESSION: No acute abnormality. Atrophy and chronic microvascular ischemic change. Electronically Signed   By: Inge Rise M.D.   On: 03/03/2016 14:26   Mr Angiogram Head W Or Wo Contrast  Result Date: 03/03/2016 CLINICAL DATA:  82 y/o F; 5 falls yesterday with dizziness and concern for posterior circulation pathology. EXAM: MRI HEAD WITHOUT CONTRAST MRA HEAD WITHOUT CONTRAST TECHNIQUE: Multiplanar, multiecho pulse sequences of the brain and surrounding structures were obtained without intravenous contrast. Angiographic images of the head were obtained using MRA technique without contrast. COMPARISON:  03/03/2016 CT head.  12/05/2008 MRI of the brain FINDINGS: MRI HEAD FINDINGS Brain: No acute infarction, hemorrhage, hydrocephalus, extra-axial collection or mass lesion. There are scattered foci of T2 FLAIR hyperintense signal abnormality in subcortical and periventricular white matter, subinsular white matter, punctate foci in the  thalamus, and small foci throughout pons and midbrain compatible with moderate chronic microvascular ischemic changes which has progressed from 2010. Moderate diffuse parenchymal volume loss. Vascular: See below. Skull and upper cervical spine: Normal marrow signal. Sinuses/Orbits: Negative. Other: None. MRA HEAD FINDINGS Slab artifact at level of internal carotid arteries, upper basilar, and proximal posterior cerebral arteries as well as right M2 inferior division with diminished signal  at level of artifact. Internal carotid arteries: Patent. Irregularity with probable mild stenosis of the paraclinoid segments is likely due to atherosclerosis. Anterior cerebral arteries:  Patent. Middle cerebral arteries: Patent. Anterior communicating artery: Not identified, hypoplastic or absent. Posterior communicating arteries: Not identified, hypoplastic or absent. Posterior cerebral arteries:  Patent. Basilar artery:  Patent. Vertebral arteries:  Patent. IMPRESSION: 1. No acute intracranial abnormality identified. 2. Moderate chronic microvascular ischemic changes and parenchymal volume loss of the brain progressed from 2010. 3. Patent circle of Willis. No evidence for large vessel occlusion, high-grade stenosis, or aneurysm. Mild narrowing of carotid siphons likely due to atherosclerosis. Electronically Signed   By: Kristine Garbe M.D.   On: 03/03/2016 18:28   Mr Brain Wo Contrast  Result Date: 03/03/2016 CLINICAL DATA:  82 y/o F; 5 falls yesterday with dizziness and concern for posterior circulation pathology. EXAM: MRI HEAD WITHOUT CONTRAST MRA HEAD WITHOUT CONTRAST TECHNIQUE: Multiplanar, multiecho pulse sequences of the brain and surrounding structures were obtained without intravenous contrast. Angiographic images of the head were obtained using MRA technique without contrast. COMPARISON:  03/03/2016 CT head.  12/05/2008 MRI of the brain FINDINGS: MRI HEAD FINDINGS Brain: No acute infarction, hemorrhage,  hydrocephalus, extra-axial collection or mass lesion. There are scattered foci of T2 FLAIR hyperintense signal abnormality in subcortical and periventricular white matter, subinsular white matter, punctate foci in the thalamus, and small foci throughout pons and midbrain compatible with moderate chronic microvascular ischemic changes which has progressed from 2010. Moderate diffuse parenchymal volume loss. Vascular: See below. Skull and upper cervical spine: Normal marrow signal. Sinuses/Orbits: Negative. Other: None. MRA HEAD FINDINGS Slab artifact at level of internal carotid arteries, upper basilar, and proximal posterior cerebral arteries as well as right M2 inferior division with diminished signal at level of artifact. Internal carotid arteries: Patent. Irregularity with probable mild stenosis of the paraclinoid segments is likely due to atherosclerosis. Anterior cerebral arteries:  Patent. Middle cerebral arteries: Patent. Anterior communicating artery: Not identified, hypoplastic or absent. Posterior communicating arteries: Not identified, hypoplastic or absent. Posterior cerebral arteries:  Patent. Basilar artery:  Patent. Vertebral arteries:  Patent. IMPRESSION: 1. No acute intracranial abnormality identified. 2. Moderate chronic microvascular ischemic changes and parenchymal volume loss of the brain progressed from 2010. 3. Patent circle of Willis. No evidence for large vessel occlusion, high-grade stenosis, or aneurysm. Mild narrowing of carotid siphons likely due to atherosclerosis. Electronically Signed   By: Kristine Garbe M.D.   On: 03/03/2016 18:28   Dg Finger Ring Left  Result Date: 03/03/2016 CLINICAL DATA:  Left ring finger pain after fall today. EXAM: LEFT RING FINGER 2+V COMPARISON:  None. 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 There is an acute closed fracture of an osteophyte off the dorsal lateral  aspect of the left ring finger involving the base of the middle phalanx. It appears slightly displaced by 2 mm. There is joint space narrowing of the fifth MCP and triscaphe articulations as well. There is soft tissue swelling about the PIP joint. IMPRESSION: Acute, closed, minimally displaced fracture of an osteophyte off the dorsal and lateral aspect of the left middle phalanx of the ring finger. There is overlying soft tissue swelling. 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 Lesser degree of joint space narrowing is seen of the fifth MCP. Electronically Signed   By: Ashley Royalty  M.D.   On: 03/03/2016 14:45    Review of Systems  Constitutional: Negative for chills and fever.  HENT: Negative for hearing loss.   Eyes: Negative for blurred vision.  Respiratory: Negative for shortness of breath.   Cardiovascular: Negative for chest pain.  Gastrointestinal: Negative for nausea and vomiting.  Genitourinary: Negative for dysuria.  Musculoskeletal: Positive for joint pain.  Skin: Negative for rash.  Neurological: Positive for dizziness. Negative for sensory change.    Blood pressure (!) 152/104, pulse 80, temperature 97.5 F (36.4 C), temperature source Oral, resp. rate (!) 21, height _0  (1.549 m), weight 69.4 kg (153 lb), SpO2 95 %. Physical Exam  Constitutional: She is oriented to person, place, and time. She appears well-developed and well-nourished. No distress.  HENT:  Head: Normocephalic and atraumatic.  Mouth/Throat: No oropharyngeal exudate.  Oral pharynx is dry.  Eyes: Pupils are equal, round, and reactive to light. No scleral icterus.  Neck: No JVD present. No tracheal deviation present. No thyromegaly present.  Cardiovascular:  Regular rate and rhythm. There is a faint 1/6 murmur  Respiratory: Effort normal and breath sounds normal. No respiratory distress.  GI: Soft. Bowel  sounds are normal. She exhibits no mass. There is no tenderness.  Musculoskeletal: She exhibits no edema.  Lymphadenopathy:    She has no cervical adenopathy.  Neurological: She is alert and oriented to person, place, and time.  Skin: Skin is warm and dry.     Assessment/Plan 1. Syncope. Etiology is unclear at this point. She had been worked up by her cardiologist Dr. Ubaldo Glassing back this summer for symptoms of lightheadedness and other atypical symptoms. She wore Holter monitor that point it was nonrevealing. He did take her off her blood pressure medications at that point. Here in the ER her systolic blood pressure was 240 lying in and 150 standing. She does have a mild murmur on exam. We'll go ahead and do cardiac enzymes. Orthostatic blood pressures every shift. Will order echocardiogram to look for any aortic stenosis that could be contributing.  2. Orthostatic blood pressures. She has a 70 point drop from lying to standing however blood pressures are still dramatically elevated. She may be used these hypoperfusion pressures in dropping it to fast may call symptoms. She was taken off her low-dose of Norvasc earlier in the summer. At this point will not restart until we get more blood pressure readings and orthostatics as do not want to lower perfusion pressure to dramatically.  3. Obstructive sleep apnea. Continue BiPAP.  4. Hypothyroidism. Going check a TSH.  Time spent 35 minutes  Baxter Hire, MD 03/03/2016, 7:56 PM

## 2016-03-03 NOTE — ED Notes (Signed)
EDP notified of pt's BP, no orders for BP medication at this time.

## 2016-03-03 NOTE — ED Notes (Signed)
Transporting to room 235-2A

## 2016-03-03 NOTE — ED Triage Notes (Signed)
Pt from home via EMS, reports 5 falls since yesterday, denies LOC, states she " gets dizzy and loses her footing"

## 2016-03-03 NOTE — ED Notes (Signed)
Patient transported to X-ray 

## 2016-03-03 NOTE — ED Provider Notes (Signed)
[facility] Emergency Department Provider Note    First MD Initiated Contact with Patient 03/03/16 1343     (approximate)  I have reviewed the triage vital signs and the nursing notes.   HISTORY  Chief Complaint Fall    HPI Kaitlyn Ramos is a 82 y.o. female who presents from 2006 after multiple falls over the past 24 hours. Patient states that she gets very dizzy and lightheaded after rising from a seated position to standing.  She did fall and hit her head multiple times yesterday but is uncertain as to whether she had any loss of consciousness. Denies any numbness or tingling at this time. States that she has a mild left frontal headache with her is associated bruising. She denies any chest pain or palpitations prior to these falls. She's not on any blood thinners.   Past Medical History:  Diagnosis Date  . Cervicalgia   . Depression   . Essential hypertension   . Hemorrhoids   . History of MRSA infection   . Hyperlipidemia   . Hypertension   . Hypothyroidism   . IBS (irritable bowel syndrome)   . Osteoarthritis of multiple joints   . Restless leg   . Senile osteoporosis   . Shingles   . Sleep apnea   . Ulcerative colitis ([place])   . Urge incontinence    Family History  Problem Relation Age of Onset  . Breast cancer Daughter   . Colon cancer Maternal Grandfather   . Hypertension Father   . Dementia Father   . Arthritis Mother   . Osteoporosis Mother   . Kidney disease Neg Hx   . Bladder Cancer Neg Hx    Past Surgical History:  Procedure Laterality Date  . ABDOMINAL HYSTERECTOMY  1980  . cataract surgery    . CHOLECYSTECTOMY  1955  . colon polyp removal  12/2012  . ESOPHAGOGASTRODUODENOSCOPY  2014  . makoplasty Left 06/2012  . parotid gland removal  1984   Patient Active Problem List   Diagnosis Date Noted  . Pain in shoulder 11/25/2014  . Impingement syndrome of shoulder 11/25/2014  . Bilirubin in urine 10/04/2014  .  Recurrent UTI 09/28/2014  . Urge incontinence 09/28/2014  . Microscopic hematuria 09/28/2014  . Cervical pain 09/28/2014  . Degenerative joint disease involving multiple joints 09/28/2014  . Involutional osteoporosis 09/28/2014  . Adult hypothyroidism 09/28/2014  . Acute low back pain 08/26/2014  . Arthralgia of hip 08/26/2014  . Combined fat and carbohydrate induced hyperlipemia 01/23/2014  . BP (high blood pressure) 01/23/2014  . Apnea, sleep 01/23/2014      Prior to Admission medications   Medication Sig Start Date End Date Taking? Authorizing Provider  amLODipine (NORVASC) 2.5 MG tablet  08/19/15   Historical Provider, MD  calcium carbonate (TUMS - DOSED IN MG ELEMENTAL CALCIUM) 500 MG chewable tablet Chew 1 tablet by mouth daily. Reported on 04/30/2015    Historical Provider, MD  Calcium Carbonate-Vitamin D (CALCIUM 500 + D) 500-125 MG-UNIT TABS Take by mouth.    Historical Provider, MD  Calcium-Vitamin D 500-125 MG-UNIT TABS Take by mouth.    Historical Provider, MD  ciprofloxacin (CIPRO) 500 MG tablet Reported on 08/05/2015 09/11/14   Historical Provider, MD  cyanocobalamin 1000 MCG tablet Take 1,000 mcg by mouth daily.    Historical Provider, MD  DULoxetine (CYMBALTA) 30 MG capsule Take 30 mg by mouth daily.    Historical Provider, MD  etodolac (LODINE) 400 MG tablet Take  400 mg by mouth 2 (two) times daily. 09/07/14   Historical Provider, MD  fluticasone (FLONASE) 50 MCG/ACT nasal spray Place into the nose. 07/21/15   Historical Provider, MD  ibuprofen (ADVIL,MOTRIN) 800 MG tablet Take 1 tablet (800 mg total) by mouth every 8 (eight) hours as needed. 12/19/15   Loney Hering, MD  levothyroxine (SYNTHROID, LEVOTHROID) 50 MCG tablet Take 50 mcg by mouth daily before breakfast.    Historical Provider, MD  meclizine (ANTIVERT) 12.5 MG tablet Take by mouth. 07/21/15   Historical Provider, MD  Multiple Vitamins-Minerals (MULTIVITAMIN ADULT PO) Take by mouth daily.    Historical Provider, MD    omeprazole (PRILOSEC) 20 MG capsule Take 20 mg by mouth daily.    Historical Provider, MD  ondansetron (ZOFRAN ODT) 4 MG disintegrating tablet Take 1 tablet (4 mg total) by mouth every 8 (eight) hours as needed for nausea or vomiting. Patient not taking: Reported on 02/09/2016 12/19/15   Loney Hering, MD  rOPINIRole (REQUIP) 0.25 MG tablet Take 0.5 mg by mouth at bedtime.     Historical Provider, MD  simvastatin (ZOCOR) 20 MG tablet Take 20 mg by mouth daily.    Historical Provider, MD  traMADol (ULTRAM) 50 MG tablet Take by mouth. 02/10/15   Historical Provider, MD  VESICARE 5 MG tablet TAKE 1 TABLET BY MOUTH DAILY 01/31/16   Nori Riis, PA-C    Allergies Codeine sulfate    Social History Social History  Substance Use Topics  . Smoking status: Former Research scientist (life sciences)  . Smokeless tobacco: Not on file     Comment: quit at age 85  . Alcohol use No     Comment: occasional    Review of Systems Patient denies headaches, rhinorrhea, blurry vision, numbness, shortness of breath, chest pain, edema, cough, abdominal pain, nausea, vomiting, diarrhea, dysuria, fevers, rashes or hallucinations unless otherwise stated above in HPI. ____________________________________________   PHYSICAL EXAM:  VITAL SIGNS: Vitals:   03/03/16 1346 03/03/16 1350  BP: (!) 193/101   Pulse: 80   Temp:  97.5 F (36.4 C)    Constitutional: Alert and oriented.  in no acute distress. Eyes: Conjunctivae are normal. PERRL. EOMI. Head: Ecchymosis to left forehead without deformity. There is no basilar skull tenderness to palpation or raccoon eyes Nose: No congestion/rhinnorhea. Mouth/Throat: Mucous membranes are moist.  Oropharynx non-erythematous. Neck: No stridor. Painless ROM. No cervical spine tenderness to palpation Hematological/Lymphatic/Immunilogical: No cervical lymphadenopathy. Cardiovascular: Normal rate, regular rhythm. Grossly normal heart sounds.  Good peripheral circulation. Respiratory:  Normal respiratory effort.  No retractions. Lungs CTAB. Gastrointestinal: Soft and nontender. No distention. No abdominal bruits. No CVA tenderness. Musculoskeletal: No lower extremity tenderness nor edema.  No joint effusions.  There is swelling and tenderness to palpation to the DIP of the left ring finger Neurologic: CN- intact.  No facial droop, Normal FNF.  Normal heel to shin.  Sensation intact bilaterally. Normal speech and language. No gross focal neurologic deficits are appreciated. No gait instability. Skin:  Skin is warm, dry and intact. No rash noted. Psychiatric: Mood and affect are normal. Speech and behavior are normal. _____________________________________   LABS (all labs ordered are listed, but only abnormal results are displayed)  Results for orders placed or performed during the hospital encounter of 03/03/16 (from the past 24 hour(s))  CBC with Differential/Platelet     Status: Abnormal   Collection Time: 03/03/16  2:33 PM  Result Value Ref Range   WBC 7.4 3.6 - 11.0 K/uL  RBC 4.96 3.80 - 5.20 MIL/uL   Hemoglobin 14.3 12.0 - 16.0 g/dL   HCT 42.9 35.0 - 47.0 %   MCV 86.5 80.0 - 100.0 fL   MCH 28.9 26.0 - 34.0 pg   MCHC 33.4 32.0 - 36.0 g/dL   RDW 14.0 11.5 - 14.5 %   Platelets 135 (L) 150 - 440 K/uL   Neutrophils Relative % 79 %   Neutro Abs 5.9 1.4 - 6.5 K/uL   Lymphocytes Relative 13 %   Lymphs Abs 0.9 (L) 1.0 - 3.6 K/uL   Monocytes Relative 6 %   Monocytes Absolute 0.5 0.2 - 0.9 K/uL   Eosinophils Relative 1 %   Eosinophils Absolute 0.1 0 - 0.7 K/uL   Basophils Relative 1 %   Basophils Absolute 0.0 0 - 0.1 K/uL  Comprehensive metabolic panel     Status: Abnormal   Collection Time: 03/03/16  2:33 PM  Result Value Ref Range   Sodium 138 135 - 145 mmol/L   Potassium 4.2 3.5 - 5.1 mmol/L   Chloride 103 101 - 111 mmol/L   CO2 27 22 - 32 mmol/L   Glucose, Bld 103 (H) 65 - 99 mg/dL   BUN 16 6 - 20 mg/dL   Creatinine, Ser 1.01 (H) 0.44 - 1.00 mg/dL    Calcium 9.5 8.9 - 10.3 mg/dL   Total Protein 7.7 6.5 - 8.1 g/dL   Albumin 4.5 3.5 - 5.0 g/dL   AST 22 15 - 41 U/L   ALT 15 14 - 54 U/L   Alkaline Phosphatase 57 38 - 126 U/L   Total Bilirubin 1.1 0.3 - 1.2 mg/dL   GFR calc non Af Amer 49 (L) >60 mL/min   GFR calc Af Amer 57 (L) >60 mL/min   Anion gap 8 5 - 15  Urinalysis complete, with microscopic ([facility] only)     Status: Abnormal   Collection Time: 03/03/16  2:33 PM  Result Value Ref Range   Color, Urine YELLOW (A) YELLOW   APPearance CLEAR (A) CLEAR   Glucose, UA NEGATIVE NEGATIVE mg/dL   Bilirubin Urine NEGATIVE NEGATIVE   Ketones, ur NEGATIVE NEGATIVE mg/dL   Specific Gravity, Urine 1.009 1.005 - 1.030   Hgb urine dipstick NEGATIVE NEGATIVE   pH 7.0 5.0 - 8.0   Protein, ur NEGATIVE NEGATIVE mg/dL   Nitrite NEGATIVE NEGATIVE   Leukocytes, UA NEGATIVE NEGATIVE   RBC / HPF 0-5 0 - 5 RBC/hpf   WBC, UA 0-5 0 - 5 WBC/hpf   Bacteria, UA RARE (A) NONE SEEN   Squamous Epithelial / LPF NONE SEEN NONE SEEN   Mucous PRESENT   Troponin I     Status: None   Collection Time: 03/03/16  2:33 PM  Result Value Ref Range   Troponin I <0.03 <0.03 ng/mL   ____________________________________________  EKG My review and personal interpretation at Time: 13:50   Indication: dizziness  Rate: 80  Rhythm: sinus Axis: normal Other: occasional PVC, no acute ischemic changes ____________________________________________  RADIOLOGY  I personally reviewed all radiographic images ordered to evaluate for the above acute complaints and reviewed radiology reports and findings.  These findings were personally discussed with the patient.  Please see medical record for radiology report. ____________________________________________   PROCEDURES  Procedure(s) performed: none ORTHOPEDIC INJURY TREATMENT Date/Time: 03/03/2016 2:49 PM Performed by: Merlyn Lot Authorized by: Merlyn Lot  Consent: Verbal consent obtained. Consent given by:  patient Injury location: finger Location details: left ring finger Injury type: fracture  Fracture type: middle phalanx Pre-procedure neurovascular assessment: neurovascularly intact Pre-procedure distal perfusion: normal Pre-procedure neurological function: normal Pre-procedure range of motion: reduced Immobilization: splint Supplies used: aluminum splint Post-procedure neurovascular assessment: post-procedure neurovascularly intact Patient tolerance: Patient tolerated the procedure well with no immediate complications       Critical Care performed: no ____________________________________________   INITIAL IMPRESSION / ASSESSMENT AND PLAN / ED COURSE  Pertinent labs & imaging results that were available during my care of the patient were reviewed by me and considered in my medical decision making (see chart for details).  DDX: vertigo, orthostasis, dysrhythmia, dehydration, sdh, iph, fracture  Jaysha Laba Houtman is a 82 y.o. who presents to the ED with frequent falls for the past 24 hours. Patient hypertensive but otherwise hemodynamic stable and appears nontoxic. Her exam is as above. Have considered above differential. Description sounds very consistent with orthostasis but given her age and risk factors will further evaluate for any underlying cardiac events. Will order CT head imaging due to concern for acute intracranial trauma. We'll order x-ray of the left finger due to acute pain with deformity.  The patient will be placed on continuous pulse oximetry and telemetry for monitoring.  Laboratory evaluation will be sent to evaluate for the above complaints.     Clinical Course as of Mar 03 2122  Thu Mar 03, 2016  1915 MRI shows no evidence of acute ischemia or evidence of vertebrobasilar insufficiency. Patient was orthostatic. Based on her symptoms and frequent falls do feel patient would benefit from admission for further evaluation and management.  Have discussed with the  patient and available family all diagnostics and treatments performed thus far and all questions were answered to the best of my ability. The patient demonstrates understanding and agreement with plan.   [PR]    Clinical Course User Index [PR] Merlyn Lot, MD     ____________________________________________   FINAL CLINICAL IMPRESSION(S) / ED DIAGNOSES  Final diagnoses:  Orthostasis  Frequent falls  Dizziness  Closed displaced fracture of middle phalanx of left ring finger, initial encounter      NEW MEDICATIONS STARTED DURING THIS VISIT:  New Prescriptions   No medications on file     Note:  This document was prepared using Dragon voice recognition software and may include unintentional dictation errors.    Merlyn Lot, MD 03/03/16 2123

## 2016-03-04 ENCOUNTER — Observation Stay
Admit: 2016-03-04 | Discharge: 2016-03-04 | Disposition: A | Discharge status: Home / Self Care | Payer: Medicare Other | Attending: Internal Medicine | Admitting: Internal Medicine

## 2016-03-04 DIAGNOSIS — G4730 Sleep apnea, unspecified: Secondary | ICD-10-CM | POA: Diagnosis not present

## 2016-03-04 DIAGNOSIS — R55 Syncope and collapse: Secondary | ICD-10-CM | POA: Diagnosis not present

## 2016-03-04 DIAGNOSIS — I10 Essential (primary) hypertension: Secondary | ICD-10-CM | POA: Diagnosis not present

## 2016-03-04 DIAGNOSIS — E039 Hypothyroidism, unspecified: Secondary | ICD-10-CM | POA: Diagnosis not present

## 2016-03-04 DIAGNOSIS — G4733 Obstructive sleep apnea (adult) (pediatric): Secondary | ICD-10-CM | POA: Diagnosis not present

## 2016-03-04 LAB — TROPONIN I

## 2016-03-04 NOTE — Care Management Obs Status (Signed)
[facility] NOTIFICATION   Patient Details  Name: Kaitlyn Ramos MRN: [id number] Date of Birth: April 27, 1927   Medicare Observation Status Notification Given:  Yes    Katrina Stack, RN 03/04/2016, 10:05 AM

## 2016-03-04 NOTE — Progress Notes (Signed)
Patient was transferred from ER following recurrent falls at [facility] independent living. On admission she was alert and oriented X4, assisted with ambulation to her room. She denied pain or being in any discomfort. Gauze was applied to patient's left elbow skin tear. Patient was oriented to her room and admission documentation was completed..  Patient has no acute event overnight, she remained in NSR, on room air, and  stable VS.Bed alarm was kept on and patient's needed items kept within patient's reach. Safety checks performed Q2H and as needed.

## 2016-03-04 NOTE — Progress Notes (Signed)
  Echocardiogram 2D Echocardiogram has been performed.  Kaitlyn Ramos M 03/04/2016, 3:12 PM

## 2016-03-04 NOTE — Care Management (Signed)
Placed in observation for syncope.  Negative troponins.  Cardiology consult is pending.  Presents from [place] [facility]- independent living.  Independent in all adls, denies issues accessing medical care, obtaining medications or with transportation.  Current with her PCP.  No discharge needs identified at present by care manager or members of care team

## 2016-03-04 NOTE — Progress Notes (Signed)
Attempting to order a Ramos bed r/t recent falls at home at this time. Kaitlyn Ramos [facility]

## 2016-03-04 NOTE — Consult Note (Signed)
[facility] Consultation Note  Patient ID: Kaitlyn Ramos, MRN: [id number], DOB/AGE: [birth year] 82 y.o. Admit date: 03/03/2016   Date of Consult: 03/04/2016 Primary Physician: Maryland Pink, MD Primary Cardiologist: None  Chief Complaint:  Chief Complaint  Patient presents with  . Fall   Reason for Consult: syncope  HPI: 82 y.o. female with known chronic kidney disease stage III sleep apnea essential hypertension who is had significant progression of weakness over the last several weeks. This is culminated in needing to be seen in the emergency room after an episode of syncope. She did not hurt herself or the syncope and she did not fall. She was out for quite some time and then was seen in the emergency room. At that time she had a significant amount of labile blood pressure with some orthostasis. This has improved over some time but still is somewhat weak with concerns. The patient has a normal EKG with normal troponin and no current evidence of myocardial  Past Medical History:  Diagnosis Date  . Cervicalgia   . Depression   . Essential hypertension   . Hemorrhoids   . History of MRSA infection   . Hyperlipidemia   . Hypertension   . Hypothyroidism   . IBS (irritable bowel syndrome)   . Osteoarthritis of multiple joints   . Restless leg   . Senile osteoporosis   . Shingles   . Sleep apnea   . Ulcerative colitis ([place])   . Urge incontinence       Surgical History:  Past Surgical History:  Procedure Laterality Date  . ABDOMINAL HYSTERECTOMY  1980  . cataract surgery    . CHOLECYSTECTOMY  1955  . colon polyp removal  12/2012  . ESOPHAGOGASTRODUODENOSCOPY  2014  . makoplasty Left 06/2012  . parotid gland removal  1984     Home Meds: Prior to Admission medications   Medication Sig Start Date End Date Taking? Authorizing Provider  Calcium Carbonate-Vitamin D (CALCIUM 500 + D) 500-125 MG-UNIT TABS Take 1 tablet by mouth daily.    Yes Historical  Provider, MD  cyanocobalamin 1000 MCG tablet Take 1,000 mcg by mouth daily.   Yes Historical Provider, MD  DULoxetine (CYMBALTA) 30 MG capsule Take 30 mg by mouth daily.   Yes Historical Provider, MD  etodolac (LODINE) 400 MG tablet Take 400 mg by mouth 2 (two) times daily. 09/07/14  Yes Historical Provider, MD  fluticasone (FLONASE) 50 MCG/ACT nasal spray Place into the nose. 07/21/15  Yes Historical Provider, MD  levothyroxine (SYNTHROID, LEVOTHROID) 50 MCG tablet Take 50 mcg by mouth daily before breakfast.   Yes Historical Provider, MD  Multiple Vitamins-Minerals (MULTIVITAMIN ADULT PO) Take by mouth daily.   Yes Historical Provider, MD  omeprazole (PRILOSEC) 20 MG capsule Take 20 mg by mouth daily.   Yes Historical Provider, MD  ondansetron (ZOFRAN ODT) 4 MG disintegrating tablet Take 1 tablet (4 mg total) by mouth every 8 (eight) hours as needed for nausea or vomiting. 12/19/15  Yes Loney Hering, MD  rOPINIRole (REQUIP) 0.25 MG tablet Take 0.5 mg by mouth at bedtime.    Yes Historical Provider, MD  simvastatin (ZOCOR) 20 MG tablet Take 20 mg by mouth daily.   Yes Historical Provider, MD  VESICARE 5 MG tablet TAKE 1 TABLET BY MOUTH DAILY 01/31/16  Yes Shannon A McGowan, PA-C  vitamin E 400 UNIT capsule Take 400 Units by mouth daily.   Yes Historical Provider, MD    Inpatient Medications:  .  calcium carbonate  1 tablet Oral Daily  . darifenacin  7.5 mg Oral Daily  . DULoxetine  30 mg Oral Daily  . enoxaparin (LOVENOX) injection  40 mg Subcutaneous Q24H  . etodolac  400 mg Oral BID  . levothyroxine  50 mcg Oral QAC breakfast  . multivitamin with minerals  1 tablet Oral Daily  . pantoprazole  40 mg Oral Daily  . rOPINIRole  0.5 mg Oral QHS  . simvastatin  20 mg Oral Daily  . sodium chloride flush  3 mL Intravenous Q12H  . cyanocobalamin  1,000 mcg Oral Daily   . sodium chloride 75 mL/hr at 03/04/16 1608  . sodium chloride 50 mL/hr at 03/03/16 2213    Allergies:  Allergies   Allergen Reactions  . Codeine Sulfate Hives and Itching    Can take tramadol    Social History   Social History  . Marital status: Single    Spouse name: N/A  . Number of children: N/A  . Years of education: N/A   Occupational History  . Not on file.   Social History Main Topics  . Smoking status: Former Research scientist (life sciences)  . Smokeless tobacco: Not on file     Comment: quit at age 11  . Alcohol use No     Comment: occasional  . Drug use: No  . Sexual activity: Not on file   Other Topics Concern  . Not on file   Social History Narrative  . No narrative on file     Family History  Problem Relation Age of Onset  . Breast cancer Daughter   . Colon cancer Maternal Grandfather   . Hypertension Father   . Dementia Father   . Arthritis Mother   . Osteoporosis Mother   . Kidney disease Neg Hx   . Bladder Cancer Neg Hx      Review of Systems Positive for Syncope shortness of breath Negative for: General:  chills, fever, night sweats or weight changes.  Cardiovascular: PND orthopnea positive for syncope dizziness  Dermatological skin lesions rashes Respiratory: Cough congestion Urologic: Frequent urination urination at night and hematuria Abdominal: negative for nausea, vomiting, diarrhea, bright red blood per rectum, melena, or hematemesis Neurologic: negative for visual changes, and/or hearing changes  All other systems reviewed and are otherwise negative except as noted above.  Labs:  Recent Labs  03/03/16 1433 03/03/16 2106 03/04/16 0214 03/04/16 0824  TROPONINI <0.03 <0.03 <0.03 <0.03   Lab Results  Component Value Date   WBC 7.4 03/03/2016   HGB 14.3 03/03/2016   HCT 42.9 03/03/2016   MCV 86.5 03/03/2016   PLT 135 (L) 03/03/2016    Recent Labs Lab 03/03/16 1433  NA 138  K 4.2  CL 103  CO2 27  BUN 16  CREATININE 1.01*  CALCIUM 9.5  PROT 7.7  BILITOT 1.1  ALKPHOS 57  ALT 15  AST 22  GLUCOSE 103*   No results found for: CHOL, HDL, LDLCALC,  TRIG No results found for: DDIMER  Radiology/Studies:  Ct Head Wo Contrast  Result Date: 03/03/2016 CLINICAL DATA:  Recurrent falls since July, 2017. Most recent fall occurred today. EXAM: CT HEAD WITHOUT CONTRAST TECHNIQUE: Contiguous axial images were obtained from the base of the skull through the vertex without intravenous contrast. COMPARISON:  Head CT scan 08/07/2015. FINDINGS: Brain: Cortical atrophy and chronic microvascular ischemic change are stable in appearance. No evidence of acute abnormality including hemorrhage, infarct, mass lesion, mass effect, midline shift or abnormal extra-axial fluid  collection. No hydrocephalus or pneumocephalus. Vascular: Atherosclerotic vascular disease is noted. Skull: Intact. Sinuses/Orbits: Negative. Other: None. IMPRESSION: No acute abnormality. Atrophy and chronic microvascular ischemic change. Electronically Signed   By: Inge Rise M.D.   On: 03/03/2016 14:26   Mr Angiogram Head W Or Wo Contrast  Result Date: 03/03/2016 CLINICAL DATA:  82 y/o F; 5 falls yesterday with dizziness and concern for posterior circulation pathology. EXAM: MRI HEAD WITHOUT CONTRAST MRA HEAD WITHOUT CONTRAST TECHNIQUE: Multiplanar, multiecho pulse sequences of the brain and surrounding structures were obtained without intravenous contrast. Angiographic images of the head were obtained using MRA technique without contrast. COMPARISON:  03/03/2016 CT head.  12/05/2008 MRI of the brain FINDINGS: MRI HEAD FINDINGS Brain: No acute infarction, hemorrhage, hydrocephalus, extra-axial collection or mass lesion. There are scattered foci of T2 FLAIR hyperintense signal abnormality in subcortical and periventricular white matter, subinsular white matter, punctate foci in the thalamus, and small foci throughout pons and midbrain compatible with moderate chronic microvascular ischemic changes which has progressed from 2010. Moderate diffuse parenchymal volume loss. Vascular: See below.  Skull and upper cervical spine: Normal marrow signal. Sinuses/Orbits: Negative. Other: None. MRA HEAD FINDINGS Slab artifact at level of internal carotid arteries, upper basilar, and proximal posterior cerebral arteries as well as right M2 inferior division with diminished signal at level of artifact. Internal carotid arteries: Patent. Irregularity with probable mild stenosis of the paraclinoid segments is likely due to atherosclerosis. Anterior cerebral arteries:  Patent. Middle cerebral arteries: Patent. Anterior communicating artery: Not identified, hypoplastic or absent. Posterior communicating arteries: Not identified, hypoplastic or absent. Posterior cerebral arteries:  Patent. Basilar artery:  Patent. Vertebral arteries:  Patent. IMPRESSION: 1. No acute intracranial abnormality identified. 2. Moderate chronic microvascular ischemic changes and parenchymal volume loss of the brain progressed from 2010. 3. Patent circle of Willis. No evidence for large vessel occlusion, high-grade stenosis, or aneurysm. Mild narrowing of carotid siphons likely due to atherosclerosis. Electronically Signed   By: Kristine Garbe M.D.   On: 03/03/2016 18:28   Mr Brain Wo Contrast  Result Date: 03/03/2016 CLINICAL DATA:  82 y/o F; 5 falls yesterday with dizziness and concern for posterior circulation pathology. EXAM: MRI HEAD WITHOUT CONTRAST MRA HEAD WITHOUT CONTRAST TECHNIQUE: Multiplanar, multiecho pulse sequences of the brain and surrounding structures were obtained without intravenous contrast. Angiographic images of the head were obtained using MRA technique without contrast. COMPARISON:  03/03/2016 CT head.  12/05/2008 MRI of the brain FINDINGS: MRI HEAD FINDINGS Brain: No acute infarction, hemorrhage, hydrocephalus, extra-axial collection or mass lesion. There are scattered foci of T2 FLAIR hyperintense signal abnormality in subcortical and periventricular white matter, subinsular white matter, punctate foci  in the thalamus, and small foci throughout pons and midbrain compatible with moderate chronic microvascular ischemic changes which has progressed from 2010. Moderate diffuse parenchymal volume loss. Vascular: See below. Skull and upper cervical spine: Normal marrow signal. Sinuses/Orbits: Negative. Other: None. MRA HEAD FINDINGS Slab artifact at level of internal carotid arteries, upper basilar, and proximal posterior cerebral arteries as well as right M2 inferior division with diminished signal at level of artifact. Internal carotid arteries: Patent. Irregularity with probable mild stenosis of the paraclinoid segments is likely due to atherosclerosis. Anterior cerebral arteries:  Patent. Middle cerebral arteries: Patent. Anterior communicating artery: Not identified, hypoplastic or absent. Posterior communicating arteries: Not identified, hypoplastic or absent. Posterior cerebral arteries:  Patent. Basilar artery:  Patent. Vertebral arteries:  Patent. IMPRESSION: 1. No acute intracranial abnormality identified. 2. Moderate chronic microvascular  ischemic changes and parenchymal volume loss of the brain progressed from 2010. 3. Patent circle of Willis. No evidence for large vessel occlusion, high-grade stenosis, or aneurysm. Mild narrowing of carotid siphons likely due to atherosclerosis. Electronically Signed   By: Kristine Garbe M.D.   On: 03/03/2016 18:28   Dg Finger Ring Left  Result Date: 03/03/2016 CLINICAL DATA:  Left ring finger pain after fall today. EXAM: LEFT RING FINGER 2+V COMPARISON:  None. 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 There is an acute closed fracture of an osteophyte off the dorsal lateral aspect of the left ring finger involving the base of the middle phalanx. It appears slightly displaced by 2 mm. There is joint space narrowing of the fifth MCP and triscaphe articulations as well. There is soft  tissue swelling about the PIP joint. IMPRESSION: Acute, closed, minimally displaced fracture of an osteophyte off the dorsal and lateral aspect of the left middle phalanx of the ring finger. There is overlying soft tissue swelling. 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 Lesser degree of joint space narrowing is seen of the fifth MCP. Electronically Signed   By: Ashley Royalty M.D.   On: 03/03/2016 14:45    EKG: Normal sinus rhythm  Weights: Filed Weights   03/03/16 1346  Weight: 69.4 kg (153 lb)     Physical Exam: Blood pressure (!) 166/79, pulse 82, temperature 98.7 F (37.1 C), temperature source Oral, resp. rate 20, height 5\' 1"  (1.549 m), weight 69.4 kg (153 lb), SpO2 94 %. Body mass index is 28.91 kg/m. General: Well developed, well nourished, in no acute distress. Head eyes ears nose throat: Normocephalic, atraumatic, sclera non-icteric, no xanthomas, nares are without discharge. No apparent thyromegaly and/or mass  Lungs: Normal respiratory effort.  no wheezes, no rales, no rhonchi.  Heart: RRR with normal S1 S2. no Left upper sternal border decrescendo murmur gallop, no rub, PMI is normal size and placement, carotid upstroke normal without bruit, jugular venous pressure is normal Abdomen: Soft, non-tender, non-distended with normoactive bowel sounds. No hepatomegaly. No rebound/guarding. No obvious abdominal masses. Abdominal aorta is normal size without bruit Extremities: No edema. no cyanosis, no clubbing, no ulcers  Peripheral : 2+ bilateral upper extremity pulses, 2+ bilateral femoral pulses, 2+ bilateral dorsal pedal pulse Neuro: Alert and oriented. No facial asymmetry. No focal deficit. Moves all extremities spontaneously. Musculoskeletal: Normal muscle tone without kyphosis Psych:  Responds to questions appropriately with a normal affect.    Assessment: 82 year old female  with essential hypertension mixed hyperlipidemia chronic kidney disease stage III and sleep apnea with syncope weakness and fatigue and orthostasis now slightly improved without evidence of myocardial infarction  Plan: 1. Continue with potential hydration and treatment of possible orthostasis and follow for worsening blood pressure thereafter 2. Echocardiogram for LV systolic dysfunction valvular heart disease contributing to above 3. Further treatment of sleep apnea which can cause rhythm disturbances currently not seen 4. Further treatment options after above  Signed, Corey Skains M.D. [facility] 03/04/2016, 5:40 PM

## 2016-03-04 NOTE — Progress Notes (Signed)
[place] [facility]   PATIENT NAME: Kaitlyn Ramos    MR#:  [id number]  DATE OF BIRTH:  [birth year]  SUBJECTIVE:82 year old f patient admitted because of multiple episodes of falls. Patient says that she never lost consciousness. No chest pain. Patient is dizzy.  CHIEF COMPLAINT:   Chief Complaint  Patient presents with  . Fall    REVIEW OF SYSTEMS:   ROS CONSTITUTIONAL: No fever, fatigue or weakness.  EYES: No blurred or double vision.  EARS, NOSE, AND THROAT: No tinnitus or ear pain.  RESPIRATORY: No cough, shortness of breath, wheezing or hemoptysis.  CARDIOVASCULAR: No chest pain, orthopnea, edema.  GASTROINTESTINAL: No nausea, vomiting, diarrhea or abdominal pain.  GENITOURINARY: No dysuria, hematuria.  ENDOCRINE: No polyuria, nocturia,  HEMATOLOGY: No anemia, easy bruising or bleeding SKIN: No rash or lesion. MUSCULOSKELETAL: No joint pain or arthritis.   NEUROLOGIC: No tingling, numbness, weakness. Complaints of dizziness. PSYCHIATRY: No anxiety or depression.   DRUG ALLERGIES:   Allergies  Allergen Reactions  . Codeine Sulfate Hives and Itching    Can take tramadol    VITALS:  Blood pressure (!) 142/86, pulse 96, temperature 98.7 F (37.1 C), temperature source Oral, resp. rate 20, height 5\' 1"  (1.549 m), weight 69.4 kg (153 lb), SpO2 94 %.  PHYSICAL EXAMINATION:  GENERAL:  82 y.o.-year-old patient lying in the bed with no acute distress.  EYES: Pupils equal, round, reactive to light and accommodation. No scleral icterus. Extraocular muscles intact.  HEENT: Head atraumatic, normocephalic. Oropharynx and nasopharynx clear.  NECK:  Supple, no jugular venous distention. No thyroid enlargement, no tenderness.  LUNGS: Normal breath sounds bilaterally, no wheezing, rales,rhonchi or crepitation. No use of accessory muscles of respiration.  CARDIOVASCULAR: S1, S2 normal. No murmurs, rubs, or gallops.  ABDOMEN: Soft, nontender,  nondistended. Bowel sounds present. No organomegaly or mass.  EXTREMITIES: No pedal edema, cyanosis, or clubbing.  NEUROLOGIC: Cranial nerves II through XII are intact. Muscle strength 5/5 in all extremities. Sensation intact. Gait not checked.  PSYCHIATRIC: The patient is alert and oriented x 3.  SKIN: No obvious rash, lesion, or ulcer.    LABORATORY PANEL:   CBC  Recent Labs Lab 03/03/16 1433  WBC 7.4  HGB 14.3  HCT 42.9  PLT 135*   ------------------------------------------------------------------------------------------------------------------  Chemistries   Recent Labs Lab 03/03/16 1433  NA 138  K 4.2  CL 103  CO2 27  GLUCOSE 103*  BUN 16  CREATININE 1.01*  CALCIUM 9.5  AST 22  ALT 15  ALKPHOS 57  BILITOT 1.1   ------------------------------------------------------------------------------------------------------------------  Cardiac Enzymes  Recent Labs Lab 03/04/16 0824  TROPONINI <0.03   ------------------------------------------------------------------------------------------------------------------  RADIOLOGY:  Ct Head Wo Contrast  Result Date: 03/03/2016 CLINICAL DATA:  Recurrent falls since July, 2017. Most recent fall occurred today. EXAM: CT HEAD WITHOUT CONTRAST TECHNIQUE: Contiguous axial images were obtained from the base of the skull through the vertex without intravenous contrast. COMPARISON:  Head CT scan 08/07/2015. FINDINGS: Brain: Cortical atrophy and chronic microvascular ischemic change are stable in appearance. No evidence of acute abnormality including hemorrhage, infarct, mass lesion, mass effect, midline shift or abnormal extra-axial fluid collection. No hydrocephalus or pneumocephalus. Vascular: Atherosclerotic vascular disease is noted. Skull: Intact. Sinuses/Orbits: Negative. Other: None. IMPRESSION: No acute abnormality. Atrophy and chronic microvascular ischemic change. Electronically Signed   By: Inge Rise M.D.   On:  03/03/2016 14:26   Mr Angiogram Head W Or Wo Contrast  Result Date: 03/03/2016 CLINICAL DATA:  82 y/o F; 5 falls yesterday with dizziness and concern for posterior circulation pathology. EXAM: MRI HEAD WITHOUT CONTRAST MRA HEAD WITHOUT CONTRAST TECHNIQUE: Multiplanar, multiecho pulse sequences of the brain and surrounding structures were obtained without intravenous contrast. Angiographic images of the head were obtained using MRA technique without contrast. COMPARISON:  03/03/2016 CT head.  12/05/2008 MRI of the brain FINDINGS: MRI HEAD FINDINGS Brain: No acute infarction, hemorrhage, hydrocephalus, extra-axial collection or mass lesion. There are scattered foci of T2 FLAIR hyperintense signal abnormality in subcortical and periventricular white matter, subinsular white matter, punctate foci in the thalamus, and small foci throughout pons and midbrain compatible with moderate chronic microvascular ischemic changes which has progressed from 2010. Moderate diffuse parenchymal volume loss. Vascular: See below. Skull and upper cervical spine: Normal marrow signal. Sinuses/Orbits: Negative. Other: None. MRA HEAD FINDINGS Slab artifact at level of internal carotid arteries, upper basilar, and proximal posterior cerebral arteries as well as right M2 inferior division with diminished signal at level of artifact. Internal carotid arteries: Patent. Irregularity with probable mild stenosis of the paraclinoid segments is likely due to atherosclerosis. Anterior cerebral arteries:  Patent. Middle cerebral arteries: Patent. Anterior communicating artery: Not identified, hypoplastic or absent. Posterior communicating arteries: Not identified, hypoplastic or absent. Posterior cerebral arteries:  Patent. Basilar artery:  Patent. Vertebral arteries:  Patent. IMPRESSION: 1. No acute intracranial abnormality identified. 2. Moderate chronic microvascular ischemic changes and parenchymal volume loss of the brain progressed from  2010. 3. Patent circle of Willis. No evidence for large vessel occlusion, high-grade stenosis, or aneurysm. Mild narrowing of carotid siphons likely due to atherosclerosis. Electronically Signed   By: Kristine Garbe M.D.   On: 03/03/2016 18:28   Mr Brain Wo Contrast  Result Date: 03/03/2016 CLINICAL DATA:  82 y/o F; 5 falls yesterday with dizziness and concern for posterior circulation pathology. EXAM: MRI HEAD WITHOUT CONTRAST MRA HEAD WITHOUT CONTRAST TECHNIQUE: Multiplanar, multiecho pulse sequences of the brain and surrounding structures were obtained without intravenous contrast. Angiographic images of the head were obtained using MRA technique without contrast. COMPARISON:  03/03/2016 CT head.  12/05/2008 MRI of the brain FINDINGS: MRI HEAD FINDINGS Brain: No acute infarction, hemorrhage, hydrocephalus, extra-axial collection or mass lesion. There are scattered foci of T2 FLAIR hyperintense signal abnormality in subcortical and periventricular white matter, subinsular white matter, punctate foci in the thalamus, and small foci throughout pons and midbrain compatible with moderate chronic microvascular ischemic changes which has progressed from 2010. Moderate diffuse parenchymal volume loss. Vascular: See below. Skull and upper cervical spine: Normal marrow signal. Sinuses/Orbits: Negative. Other: None. MRA HEAD FINDINGS Slab artifact at level of internal carotid arteries, upper basilar, and proximal posterior cerebral arteries as well as right M2 inferior division with diminished signal at level of artifact. Internal carotid arteries: Patent. Irregularity with probable mild stenosis of the paraclinoid segments is likely due to atherosclerosis. Anterior cerebral arteries:  Patent. Middle cerebral arteries: Patent. Anterior communicating artery: Not identified, hypoplastic or absent. Posterior communicating arteries: Not identified, hypoplastic or absent. Posterior cerebral arteries:  Patent.  Basilar artery:  Patent. Vertebral arteries:  Patent. IMPRESSION: 1. No acute intracranial abnormality identified. 2. Moderate chronic microvascular ischemic changes and parenchymal volume loss of the brain progressed from 2010. 3. Patent circle of Willis. No evidence for large vessel occlusion, high-grade stenosis, or aneurysm. Mild narrowing of carotid siphons likely due to atherosclerosis. Electronically Signed   By: Kristine Garbe M.D.   On: 03/03/2016 18:28   Dg Finger Ring Left  Result Date: 03/03/2016 CLINICAL DATA:  Left ring finger pain after fall today. EXAM: LEFT RING FINGER 2+V COMPARISON:  None. 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 There is an acute closed fracture of an osteophyte off the dorsal lateral aspect of the left ring finger involving the base of the middle phalanx. It appears slightly displaced by 2 mm. There is joint space narrowing of the fifth MCP and triscaphe articulations as well. There is soft tissue swelling about the PIP joint. IMPRESSION: Acute, closed, minimally displaced fracture of an osteophyte off the dorsal and lateral aspect of the left middle phalanx of the ring finger. There is overlying soft tissue swelling. 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 Lesser degree of joint space narrowing is seen of the fifth MCP. Electronically Signed   By: Ashley Royalty M.D.   On: 03/03/2016 14:45    EKG:   Orders placed or performed during the hospital encounter of 03/03/16  . ED EKG  . ED EKG    ASSESSMENT AND PLAN:   #1 syncope: Reviewed as: Troponins are negative cardiology consult pending.Patient has orthostatic changes of blood present in the emergency room. Check orthostatic vitals ordred,, Echocardiogram is ordered.  Follow with cardio rec.,PT eval Likely d/c if no further  work up planned,by am   #2hypothyroid;on synthyroid   All the records are reviewed and case discussed with Care Management/Social Workerr. Management plans discussed with the patient, family and they are in agreement.  CODE STATUS: full  TOTAL TIME TAKING CARE OF THIS PATIENT:35  minutes.   POSSIBLE D/C IN 1-2 DAYS, DEPENDING ON CLINICAL CONDITION.   Epifanio Lesches M.D on 03/04/2016 at 2:11 PM  Between 7am to 6pm - Pager - [phone]  After 6pm go to [email or web address] - password [email or web address] [facility]  Office  [phone]  CC: Primary care physician; [place] Pink, MD   Note: This dictation was prepared with Dragon dictation along with smaller phrase technology. Any transcriptional errors that result from this process are unintentional.

## 2016-03-05 ENCOUNTER — Encounter: Payer: Self-pay | Admitting: *Deleted

## 2016-03-05 DIAGNOSIS — G4733 Obstructive sleep apnea (adult) (pediatric): Secondary | ICD-10-CM | POA: Diagnosis not present

## 2016-03-05 DIAGNOSIS — R55 Syncope and collapse: Secondary | ICD-10-CM | POA: Diagnosis not present

## 2016-03-05 DIAGNOSIS — E039 Hypothyroidism, unspecified: Secondary | ICD-10-CM | POA: Diagnosis not present

## 2016-03-05 DIAGNOSIS — I10 Essential (primary) hypertension: Secondary | ICD-10-CM | POA: Diagnosis not present

## 2016-03-05 DIAGNOSIS — G4730 Sleep apnea, unspecified: Secondary | ICD-10-CM | POA: Diagnosis not present

## 2016-03-05 LAB — ECHOCARDIOGRAM COMPLETE
Height: 61 in
Weight: 2448 oz

## 2016-03-05 MED ORDER — HYDRALAZINE HCL 20 MG/ML IJ SOLN
20.0000 mg | Freq: Once | INTRAMUSCULAR | Status: AC
  Administered 2016-03-05: 20 mg via INTRAVENOUS

## 2016-03-05 MED ORDER — HYDRALAZINE HCL 20 MG/ML IJ SOLN
INTRAMUSCULAR | Status: AC
  Filled 2016-03-05: qty 1

## 2016-03-05 NOTE — Progress Notes (Signed)
[facility] Cardiology [facility] Encounter Note  Patient: Kaitlyn Ramos / Admit Date: 03/03/2016 / Date of Encounter: 03/05/2016, 6:14 AM   Subjective: Patient arrested well last night. Patient still overall very weak and fatigue. No further episode of true syncope. Telemetry shows no evidence of significant heart block Echocardiogram pending we will go to evaluate at this time  Review of Systems: Positive for: Severe weakness and fatigue and shortness of breath Negative for: Vision change, hearing change, syncope, dizziness, nausea, vomiting,diarrhea, bloody stool, stomach pain, cough, congestion, diaphoresis, urinary frequency, urinary pain,skin lesions, skin rashes Others previously listed  Objective: Telemetry: Normal sinus rhythm Physical Exam: Blood pressure (!) 175/80, pulse 71, temperature 98.1 F (36.7 C), temperature source Oral, resp. rate 18, height 5\' 1"  (1.549 m), weight 69.4 kg (153 lb), SpO2 94 %. Body mass index is 28.91 kg/m. General: Well developed, well nourished, in no acute distress. Head: Normocephalic, atraumatic, sclera non-icteric, no xanthomas, nares are without discharge. Neck: No apparent masses Lungs: Normal respirations with no wheezes, no rhonchi, no rales , few crackles   Heart: Regular rate and rhythm, normal S1 S2, no murmur, no rub, no gallop, PMI is normal size and placement, carotid upstroke normal without bruit, jugular venous pressure normal Abdomen: Soft, non-tender, non-distended with normoactive bowel sounds. No hepatosplenomegaly. Abdominal aorta is normal size without bruit Extremities: No edema, no clubbing, no cyanosis, no ulcers,  Peripheral: 2+ radial, 2+ femoral, 2+ dorsal pedal pulses Neuro: Alert and oriented. Moves all extremities spontaneously. Psych:  Responds to questions appropriately with a normal affect.   Intake/Output Summary (Last 24 hours) at 03/05/16 0614 Last data filed at 03/05/16 0426  Gross per 24 hour   Intake              480 ml  Output              500 ml  Net              -20 ml    Inpatient Medications:  . calcium carbonate  1 tablet Oral Daily  . darifenacin  7.5 mg Oral Daily  . DULoxetine  30 mg Oral Daily  . enoxaparin (LOVENOX) injection  40 mg Subcutaneous Q24H  . etodolac  400 mg Oral BID  . levothyroxine  50 mcg Oral QAC breakfast  . multivitamin with minerals  1 tablet Oral Daily  . pantoprazole  40 mg Oral Daily  . rOPINIRole  0.5 mg Oral QHS  . simvastatin  20 mg Oral Daily  . sodium chloride flush  3 mL Intravenous Q12H  . cyanocobalamin  1,000 mcg Oral Daily   Infusions:  . sodium chloride 75 mL/hr at 03/04/16 1608  . sodium chloride 50 mL/hr at 03/05/16 0318    Labs:  Recent Labs  03/03/16 1433  NA 138  K 4.2  CL 103  CO2 27  GLUCOSE 103*  BUN 16  CREATININE 1.01*  CALCIUM 9.5    Recent Labs  03/03/16 1433  AST 22  ALT 15  ALKPHOS 57  BILITOT 1.1  PROT 7.7  ALBUMIN 4.5    Recent Labs  03/03/16 1433  WBC 7.4  NEUTROABS 5.9  HGB 14.3  HCT 42.9  MCV 86.5  PLT 135*    Recent Labs  03/03/16 1433 03/03/16 2106 03/04/16 0214 03/04/16 0824  TROPONINI <0.03 <0.03 <0.03 <0.03   Invalid input(s): POCBNP No results for input(s): HGBA1C in the last 72 hours.   Weights: Filed Weights   03/03/16  1346  Weight: 69.4 kg (153 lb)     Radiology/Studies:  Ct Head Wo Contrast  Result Date: 03/03/2016 CLINICAL DATA:  Recurrent falls since July, 2017. Most recent fall occurred today. EXAM: CT HEAD WITHOUT CONTRAST TECHNIQUE: Contiguous axial images were obtained from the base of the skull through the vertex without intravenous contrast. COMPARISON:  Head CT scan 08/07/2015. FINDINGS: Brain: Cortical atrophy and chronic microvascular ischemic change are stable in appearance. No evidence of acute abnormality including hemorrhage, infarct, mass lesion, mass effect, midline shift or abnormal extra-axial fluid collection. No hydrocephalus or  pneumocephalus. Vascular: Atherosclerotic vascular disease is noted. Skull: Intact. Sinuses/Orbits: Negative. Other: None. IMPRESSION: No acute abnormality. Atrophy and chronic microvascular ischemic change. Electronically Signed   By: Inge Rise M.D.   On: 03/03/2016 14:26   Mr Angiogram Head W Or Wo Contrast  Result Date: 03/03/2016 CLINICAL DATA:  82 y/o F; 5 falls yesterday with dizziness and concern for posterior circulation pathology. EXAM: MRI HEAD WITHOUT CONTRAST MRA HEAD WITHOUT CONTRAST TECHNIQUE: Multiplanar, multiecho pulse sequences of the brain and surrounding structures were obtained without intravenous contrast. Angiographic images of the head were obtained using MRA technique without contrast. COMPARISON:  03/03/2016 CT head.  12/05/2008 MRI of the brain FINDINGS: MRI HEAD FINDINGS Brain: No acute infarction, hemorrhage, hydrocephalus, extra-axial collection or mass lesion. There are scattered foci of T2 FLAIR hyperintense signal abnormality in subcortical and periventricular white matter, subinsular white matter, punctate foci in the thalamus, and small foci throughout pons and midbrain compatible with moderate chronic microvascular ischemic changes which has progressed from 2010. Moderate diffuse parenchymal volume loss. Vascular: See below. Skull and upper cervical spine: Normal marrow signal. Sinuses/Orbits: Negative. Other: None. MRA HEAD FINDINGS Slab artifact at level of internal carotid arteries, upper basilar, and proximal posterior cerebral arteries as well as right M2 inferior division with diminished signal at level of artifact. Internal carotid arteries: Patent. Irregularity with probable mild stenosis of the paraclinoid segments is likely due to atherosclerosis. Anterior cerebral arteries:  Patent. Middle cerebral arteries: Patent. Anterior communicating artery: Not identified, hypoplastic or absent. Posterior communicating arteries: Not identified, hypoplastic or absent.  Posterior cerebral arteries:  Patent. Basilar artery:  Patent. Vertebral arteries:  Patent. IMPRESSION: 1. No acute intracranial abnormality identified. 2. Moderate chronic microvascular ischemic changes and parenchymal volume loss of the brain progressed from 2010. 3. Patent circle of Willis. No evidence for large vessel occlusion, high-grade stenosis, or aneurysm. Mild narrowing of carotid siphons likely due to atherosclerosis. Electronically Signed   By: Kristine Garbe M.D.   On: 03/03/2016 18:28   Mr Brain Wo Contrast  Result Date: 03/03/2016 CLINICAL DATA:  82 y/o F; 5 falls yesterday with dizziness and concern for posterior circulation pathology. EXAM: MRI HEAD WITHOUT CONTRAST MRA HEAD WITHOUT CONTRAST TECHNIQUE: Multiplanar, multiecho pulse sequences of the brain and surrounding structures were obtained without intravenous contrast. Angiographic images of the head were obtained using MRA technique without contrast. COMPARISON:  03/03/2016 CT head.  12/05/2008 MRI of the brain FINDINGS: MRI HEAD FINDINGS Brain: No acute infarction, hemorrhage, hydrocephalus, extra-axial collection or mass lesion. There are scattered foci of T2 FLAIR hyperintense signal abnormality in subcortical and periventricular white matter, subinsular white matter, punctate foci in the thalamus, and small foci throughout pons and midbrain compatible with moderate chronic microvascular ischemic changes which has progressed from 2010. Moderate diffuse parenchymal volume loss. Vascular: See below. Skull and upper cervical spine: Normal marrow signal. Sinuses/Orbits: Negative. Other: None. MRA HEAD FINDINGS Slab artifact  at level of internal carotid arteries, upper basilar, and proximal posterior cerebral arteries as well as right M2 inferior division with diminished signal at level of artifact. Internal carotid arteries: Patent. Irregularity with probable mild stenosis of the paraclinoid segments is likely due to  atherosclerosis. Anterior cerebral arteries:  Patent. Middle cerebral arteries: Patent. Anterior communicating artery: Not identified, hypoplastic or absent. Posterior communicating arteries: Not identified, hypoplastic or absent. Posterior cerebral arteries:  Patent. Basilar artery:  Patent. Vertebral arteries:  Patent. IMPRESSION: 1. No acute intracranial abnormality identified. 2. Moderate chronic microvascular ischemic changes and parenchymal volume loss of the brain progressed from 2010. 3. Patent circle of Willis. No evidence for large vessel occlusion, high-grade stenosis, or aneurysm. Mild narrowing of carotid siphons likely due to atherosclerosis. Electronically Signed   By: Kristine Garbe M.D.   On: 03/03/2016 18:28   Dg Finger Ring Left  Result Date: 03/03/2016 CLINICAL DATA:  Left ring finger pain after fall today. EXAM: LEFT RING FINGER 2+V COMPARISON:  None. 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 There is an acute closed fracture of an osteophyte off the dorsal lateral aspect of the left ring finger involving the base of the middle phalanx. It appears slightly displaced by 2 mm. There is joint space narrowing of the fifth MCP and triscaphe articulations as well. There is soft tissue swelling about the PIP joint. IMPRESSION: Acute, closed, minimally displaced fracture of an osteophyte off the dorsal and lateral aspect of the left middle phalanx of the ring finger. There is overlying soft tissue swelling. 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 Lesser degree of joint space narrowing is seen of the fifth MCP. Electronically Signed   By: Ashley Royalty M.D.   On: 03/03/2016 14:45     Assessment and Recommendation  82 y.o. female with known essential hypertension with now orthostatic hypotension having weakness  and fatigue of unknown etiology with chronic kidney disease stage II sleep apnea and no current evidence of myocardial infarction with episode of syncope 1. Echocardiogram evaluation today for valvular heart disease potentially contributing to above 2. Begin ambulation and possible rehabilitation evaluating for significant orthostasis and causes of orthostasis  3. Hydration for chronic kidney disease 4. Continue telemetry to assess for possible rhythm disturbances causing above X 5. Further treatment of sleep apnea as able  Signed, Serafina Royals M.D. FACC

## 2016-03-05 NOTE — Progress Notes (Signed)
Patient reported wearing BiPAP at night for sleep apnea. MD called and order obtained for BiPAP QHS.

## 2016-03-05 NOTE — Progress Notes (Signed)
[place] [facility] [place] NAME: Natajah Rybinski    MR#:  [id number]  DATE OF BIRTH:  1928-02-27  Patient says she feels weak today and doesn't feel like going home. Blood pressure is elevated this morning, and we'll give IV hydralazine 20 mg iv.  CHIEF COMPLAINT:   Chief Complaint  Patient presents with  . Fall    REVIEW OF SYSTEMS:   ROS CONSTITUTIONAL: No fever, fatigue or weakness.  EYES: No blurred or double vision.  EARS, NOSE, AND THROAT: No tinnitus or ear pain.  RESPIRATORY: No cough, shortness of breath, wheezing or hemoptysis.  CARDIOVASCULAR: No chest pain, orthopnea, edema.  GASTROINTESTINAL: No nausea, vomiting, diarrhea or abdominal pain.  GENITOURINARY: No dysuria, hematuria.  ENDOCRINE: No polyuria, nocturia,  HEMATOLOGY: No anemia, easy bruising or bleeding SKIN: No rash or lesion. MUSCULOSKELETAL: No joint pain or arthritis.   NEUROLOGIC: No tingling, numbness, weakness. Complaints of dizziness. PSYCHIATRY: No anxiety or depression.   DRUG ALLERGIES:   Allergies  Allergen Reactions  . Codeine Sulfate Hives and Itching    Can take tramadol    VITALS:  Blood pressure (!) 159/82, pulse 82, temperature 97.6 F (36.4 C), temperature source Oral, resp. rate 16, height 5\' 1"  (1.549 m), weight 69.4 kg (153 lb), SpO2 96 %.  PHYSICAL EXAMINATION:  GENERAL:  82 y.o.-year-old patient lying in the bed with no acute distress.  EYES: Pupils equal, round, reactive to light and accommodation. No scleral icterus. Extraocular muscles intact.  HEENT: Head atraumatic, normocephalic. Oropharynx and nasopharynx clear.  NECK:  Supple, no jugular venous distention. No thyroid enlargement, no tenderness.  LUNGS: Normal breath sounds bilaterally, no wheezing, rales,rhonchi or crepitation. No use of accessory muscles of respiration.  CARDIOVASCULAR: S1, S2 normal. No murmurs, rubs, or gallops.  ABDOMEN: Soft, nontender,  nondistended. Bowel sounds present. No organomegaly or mass.  EXTREMITIES: No pedal edema, cyanosis, or clubbing.  NEUROLOGIC: Cranial nerves II through XII are intact. Muscle strength 5/5 in all extremities. Sensation intact. Gait not checked.  PSYCHIATRIC: The patient is alert and oriented x 3.  SKIN: No obvious rash, lesion, or ulcer.    LABORATORY PANEL:   CBC  Recent Labs Lab 03/03/16 1433  WBC 7.4  HGB 14.3  HCT 42.9  PLT 135*   ------------------------------------------------------------------------------------------------------------------  Chemistries   Recent Labs Lab 03/03/16 1433  NA 138  K 4.2  CL 103  CO2 27  GLUCOSE 103*  BUN 16  CREATININE 1.01*  CALCIUM 9.5  AST 22  ALT 15  ALKPHOS 57  BILITOT 1.1   ------------------------------------------------------------------------------------------------------------------  Cardiac Enzymes  Recent Labs Lab 03/04/16 0824  TROPONINI <0.03   ------------------------------------------------------------------------------------------------------------------  RADIOLOGY:  Ct Head Wo Contrast  Result Date: 03/03/2016 CLINICAL DATA:  Recurrent falls since July, 2017. Most recent fall occurred today. EXAM: CT HEAD WITHOUT CONTRAST TECHNIQUE: Contiguous axial images were obtained from the base of the skull through the vertex without intravenous contrast. COMPARISON:  Head CT scan 08/07/2015. FINDINGS: Brain: Cortical atrophy and chronic microvascular ischemic change are stable in appearance. No evidence of acute abnormality including hemorrhage, infarct, mass lesion, mass effect, midline shift or abnormal extra-axial fluid collection. No hydrocephalus or pneumocephalus. Vascular: Atherosclerotic vascular disease is noted. Skull: Intact. Sinuses/Orbits: Negative. Other: None. IMPRESSION: No acute abnormality. Atrophy and chronic microvascular ischemic change. Electronically Signed   By: Inge Rise M.D.   On:  03/03/2016 14:26   Mr Angiogram Head W Or Wo Contrast  Result Date:  03/03/2016 CLINICAL DATA:  82 y/o F; 5 falls yesterday with dizziness and concern for posterior circulation pathology. EXAM: MRI HEAD WITHOUT CONTRAST MRA HEAD WITHOUT CONTRAST TECHNIQUE: Multiplanar, multiecho pulse sequences of the brain and surrounding structures were obtained without intravenous contrast. Angiographic images of the head were obtained using MRA technique without contrast. COMPARISON:  03/03/2016 CT head.  12/05/2008 MRI of the brain FINDINGS: MRI HEAD FINDINGS Brain: No acute infarction, hemorrhage, hydrocephalus, extra-axial collection or mass lesion. There are scattered foci of T2 FLAIR hyperintense signal abnormality in subcortical and periventricular white matter, subinsular white matter, punctate foci in the thalamus, and small foci throughout pons and midbrain compatible with moderate chronic microvascular ischemic changes which has progressed from 2010. Moderate diffuse parenchymal volume loss. Vascular: See below. Skull and upper cervical spine: Normal marrow signal. Sinuses/Orbits: Negative. Other: None. MRA HEAD FINDINGS Slab artifact at level of internal carotid arteries, upper basilar, and proximal posterior cerebral arteries as well as right M2 inferior division with diminished signal at level of artifact. Internal carotid arteries: Patent. Irregularity with probable mild stenosis of the paraclinoid segments is likely due to atherosclerosis. Anterior cerebral arteries:  Patent. Middle cerebral arteries: Patent. Anterior communicating artery: Not identified, hypoplastic or absent. Posterior communicating arteries: Not identified, hypoplastic or absent. Posterior cerebral arteries:  Patent. Basilar artery:  Patent. Vertebral arteries:  Patent. IMPRESSION: 1. No acute intracranial abnormality identified. 2. Moderate chronic microvascular ischemic changes and parenchymal volume loss of the brain progressed from  2010. 3. Patent circle of Willis. No evidence for large vessel occlusion, high-grade stenosis, or aneurysm. Mild narrowing of carotid siphons likely due to atherosclerosis. Electronically Signed   By: Kristine Garbe M.D.   On: 03/03/2016 18:28   Mr Brain Wo Contrast  Result Date: 03/03/2016 CLINICAL DATA:  82 y/o F; 5 falls yesterday with dizziness and concern for posterior circulation pathology. EXAM: MRI HEAD WITHOUT CONTRAST MRA HEAD WITHOUT CONTRAST TECHNIQUE: Multiplanar, multiecho pulse sequences of the brain and surrounding structures were obtained without intravenous contrast. Angiographic images of the head were obtained using MRA technique without contrast. COMPARISON:  03/03/2016 CT head.  12/05/2008 MRI of the brain FINDINGS: MRI HEAD FINDINGS Brain: No acute infarction, hemorrhage, hydrocephalus, extra-axial collection or mass lesion. There are scattered foci of T2 FLAIR hyperintense signal abnormality in subcortical and periventricular white matter, subinsular white matter, punctate foci in the thalamus, and small foci throughout pons and midbrain compatible with moderate chronic microvascular ischemic changes which has progressed from 2010. Moderate diffuse parenchymal volume loss. Vascular: See below. Skull and upper cervical spine: Normal marrow signal. Sinuses/Orbits: Negative. Other: None. MRA HEAD FINDINGS Slab artifact at level of internal carotid arteries, upper basilar, and proximal posterior cerebral arteries as well as right M2 inferior division with diminished signal at level of artifact. Internal carotid arteries: Patent. Irregularity with probable mild stenosis of the paraclinoid segments is likely due to atherosclerosis. Anterior cerebral arteries:  Patent. Middle cerebral arteries: Patent. Anterior communicating artery: Not identified, hypoplastic or absent. Posterior communicating arteries: Not identified, hypoplastic or absent. Posterior cerebral arteries:  Patent.  Basilar artery:  Patent. Vertebral arteries:  Patent. IMPRESSION: 1. No acute intracranial abnormality identified. 2. Moderate chronic microvascular ischemic changes and parenchymal volume loss of the brain progressed from 2010. 3. Patent circle of Willis. No evidence for large vessel occlusion, high-grade stenosis, or aneurysm. Mild narrowing of carotid siphons likely due to atherosclerosis. Electronically Signed   By: Kristine Garbe M.D.   On: 03/03/2016 18:28   Dg  Finger Ring Left  Result Date: 03/03/2016 CLINICAL DATA:  Left ring finger pain after fall today. EXAM: LEFT RING FINGER 2+V COMPARISON:  None. 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 There is an acute closed fracture of an osteophyte off the dorsal lateral aspect of the left ring finger involving the base of the middle phalanx. It appears slightly displaced by 2 mm. There is joint space narrowing of the fifth MCP and triscaphe articulations as well. There is soft tissue swelling about the PIP joint. IMPRESSION: Acute, closed, minimally displaced fracture of an osteophyte off the dorsal and lateral aspect of the left middle phalanx of the ring finger. There is overlying soft tissue swelling. 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 Lesser degree of joint space narrowing is seen of the fifth MCP. Electronically Signed   By: Ashley Royalty M.D.   On: 03/03/2016 14:45    EKG:   Orders placed or performed during the hospital encounter of 03/03/16  . ED EKG  . ED EKG    ASSESSMENT AND PLAN:   #1 syncope: Reviewed as: Troponins are negative cardiology consult pending.Patient has orthostatic changes of blood present in the emergency room. Echocardiogram showed EF more than 55%. Order carotid ultrasound,/  #2hypothyroid;on synthyroid #3 accelerated  hypertension: Received hydralazine IV. Generalized weakness, dizziness: Physical therapy consult this morning.  All the records are reviewed and case discussed with Care Management/Social Workerr. Management plans discussed with the patient, family and they are in agreement.  CODE STATUS: full  TOTAL TIME TAKING CARE OF THIS PATIENT:35  minutes.   POSSIBLE D/C IN 1-2 DAYS, DEPENDING ON CLINICAL CONDITION.   Epifanio Lesches M.D on 03/05/2016 at 11:08 AM  Between 7am to 6pm - Pager - [phone]  After 6pm go to [email or web address] - password [email or web address] [facility]  Office  [phone]  CC: Primary care physician; [place] Pink, MD   Note: This dictation was prepared with Dragon dictation along with smaller phrase technology. Any transcriptional errors that result from this process are unintentional.

## 2016-03-05 NOTE — Evaluation (Addendum)
Physical Therapy Evaluation Patient Details Name: RAFEEF Ramos MRN: [id number] DOB: 1928-01-18 Today's Date: 03/05/2016   History of Present Illness  H&P states that Kaitlyn Ramos was walking with her walker 2 days ago at [facility] and got dizzy and passed out. However during PT evaluation today pt denies any syncopal episode or LOC. She does endorse 10-12 falls since September 2017 due to loss of balance. Pt reports that these falls started acutely in September and pt is unable to pinpoint a cause. She reports some episodes of dizziness and states that she struggles with head turns.   Clinical Impression  Pt admitted with above diagnosis. Pt currently with functional limitations due to the deficits listed below (see PT Problem List).  History obtained by physical therapy today contradicts some of the history from H&P. Patient reports she has never had any loss of consciousness or syncopal episodes with her recent falls but is simply losing her balance. She reports acute onset of worsening balance and falls since September 2017. She is unable to determine a cause for this. Patient has good strength with bed mobility and transfers. She is able to ambulate a full lap around the nursing station with a rolling walker. Attempted ambulation without walker and patient does well when gaze is directed forward however with horizontal and vertical head turns patient demonstrates severe loss of balance requiring assistance from therapist to prevent falls. Patient reports intermittent dizziness and struggles especially with head turns. Bedside exam reveals spontaneous L torsional nystagmus which patient reports she has had since she was 82 years old. Negative post-headshake nystagmus. Pt denies any vertigo. VOR positive for difficulty maintaining gaze, and VOR thrust is positive to the right. Pt unable to maintain static balance with feet together at bedside due to loss of balance within 3-5 seconds. Pt reports  she has seen Dr. Pryor Ochoa at [facility] 6 months ago because of hearing loss. She might benefit from a follow-up with ENT for a VNG study to rule out any vestibular dysfunction which may be contributing to her loss of balance. Patient is safe to return to her independent living apartment at [facility] and should use her rollator at all times for ambulation and to reduce fall risk. She would benefit from home health physical therapy at her facility for balance training and to reduce risk for future falls. Pt will benefit from skilled PT services to address deficits in strength, balance, and mobility in order to return to full function at home.     Follow Up Recommendations Home health PT;Other (comment) (Pt would benefit from PT at [facility],)    Equipment Recommendations  None recommended by PT;Other (comment) (Use rollator at all times)    Recommendations for Other Services       Precautions / Restrictions Precautions Precautions: Fall Restrictions Weight Bearing Restrictions: No      Mobility  Bed Mobility Overal bed mobility: Independent Bed Mobility: Supine to Sit;Sit to Supine     Supine to sit: Independent Sit to supine: Independent   General bed mobility comments: good speed/sequencing with all bed mobility  Transfers Overall transfer level: Needs assistance Equipment used: Rolling walker (2 wheeled) Transfers: Sit to/from Stand Sit to Stand: Supervision         General transfer comment: Pt demonstrates good stability with transfers. Safe hand placement and good balance with feet apart in standing  Ambulation/Gait Ambulation/Gait assistance: Min guard Ambulation Distance (Feet): 220 Feet Assistive device: Rolling walker (2 wheeled)  Gait velocity: Functional for full facility ambulation   General Gait Details: Pt able to ambulate a full lap around RN station with her rolling walker. She demonstrates good stability with UE support. Reports mild lightheadedness  but no pre-syncopal sensations. VSS throughout ambulation and pt denies DOE. Attempted ambulation without rolling walker and pt is steady while gaze is directed forward. With horizontal and vertical head turns pt demonstrates severe lateral gait deviation and slowing requiring therapist support to prevent falls.   Stairs            Wheelchair Mobility    Modified Rankin (Stroke Patients Only)       Balance Overall balance assessment: Needs assistance Sitting-balance support: No upper extremity supported Sitting balance-Leahy Scale: Normal     Standing balance support: No upper extremity supported Standing balance-Leahy Scale: Fair Standing balance comment: Fair balance with feet apart. With feet together pt unable to maintain upright balance. With eyes closed pt falls immediately.                             Pertinent Vitals/Pain Pain Assessment: No/denies pain    Home Living Family/patient expects to be discharged to:: Private residence (Independent apartment at [facility]) Living Arrangements: Alone Available Help at Discharge: Other (Comment) (Lives at [facility], access to respite care) Type of Home: Apartment Home Access: Level entry     Home Layout: One level Home Equipment: Walker - 4 wheels      Prior Function Level of Independence: Independent with assistive device(s)         Comments: Uses rollator for ambulation. Endorses 10-12 falls since September 2017     Hand Dominance   Dominant Hand: Right    Extremity/Trunk Assessment   Upper Extremity Assessment: Overall WFL for tasks assessed           Lower Extremity Assessment: Overall WFL for tasks assessed         Communication   Communication: No difficulties  Cognition Arousal/Alertness: Awake/alert Behavior During Therapy: WFL for tasks assessed/performed Overall Cognitive Status: Within Functional Limits for tasks assessed                      General Comments       Exercises     Assessment/Plan    PT Assessment Patient needs continued PT services  PT Problem List Decreased strength;Decreased activity tolerance;Decreased balance;Decreased safety awareness          PT Treatment Interventions DME instruction;Gait training;Therapeutic activities;Therapeutic exercise;Balance training;Neuromuscular re-education;Cognitive remediation;Patient/family education;Other (comment) (Vestibular therapy)    PT Goals (Current goals can be found in the Care Plan section)  Acute Rehab PT Goals Patient Stated Goal: Return to prior function at reduce falls PT Goal Formulation: With patient Time For Goal Achievement: 03/19/16 Potential to Achieve Goals: Fair    Frequency Min 2X/week   Barriers to discharge Decreased caregiver support Lives at independent living component of [facility]    Co-evaluation               End of Session Equipment Utilized During Treatment: Gait belt Activity Tolerance: Patient tolerated treatment well Patient left: in bed;with call bell/phone within reach;with bed alarm set Nurse Communication: Mobility status;Other (comment) (RN present for ambulation)    Functional Assessment Tool Used: clinical judgement Functional Limitation: Mobility: Walking and moving around Mobility: Walking and Moving Around Current Status [phone]): At least 20 percent but less than 40  percent impaired, limited or restricted Mobility: Walking and Moving Around Goal Status [phone]): At least 1 percent but less than 20 percent impaired, limited or restricted    Time: 1635-1705 PT Time Calculation (min) (ACUTE ONLY): 30 min   Charges:   PT Evaluation $PT Eval Moderate Complexity: 1 Procedure PT Treatments $Gait Training: 8-22 mins   PT G Codes:   PT G-Codes **NOT FOR INPATIENT CLASS** Functional Assessment Tool Used: clinical judgement Functional Limitation: Mobility: Walking and moving around Mobility: Walking and Moving Around Current  Status [phone]): At least 20 percent but less than 40 percent impaired, limited or restricted Mobility: Walking and Moving Around Goal Status [phone]): At least 1 percent but less than 20 percent impaired, limited or restricted   Phillips Grout PT, DPT   Huprich,Jason 03/05/2016, 5:13 PM

## 2016-03-05 NOTE — Care Management (Signed)
Discussed continued observation status with attending.  Patient had a systolic blood pressure of 193 this morning requiring IV hydralazine.  Discussed that patient is from [employer].  She is currently observation and medicare is her primary payor.  If needed, she may have some available respite days for skilled if needed.  Otherwise, she could have home health.  Orthostasis improved

## 2016-03-05 NOTE — Progress Notes (Signed)
Patient is unaware of home bipap settings. Daughter was called and stated she would ask husband to bring patients home bipap as settings are unknown otherwise. Ok to use pts home bipap per MD.

## 2016-03-05 NOTE — Progress Notes (Signed)
Patients BP 187/89 upon assessment. Patient nonsymptomatic and denies pain. MD paged and orders given for hydralazine 20mg  IV once. Will give and continue to monitor.

## 2016-03-06 DIAGNOSIS — R55 Syncope and collapse: Secondary | ICD-10-CM | POA: Diagnosis not present

## 2016-03-06 DIAGNOSIS — G4730 Sleep apnea, unspecified: Secondary | ICD-10-CM | POA: Diagnosis not present

## 2016-03-06 DIAGNOSIS — E039 Hypothyroidism, unspecified: Secondary | ICD-10-CM | POA: Diagnosis not present

## 2016-03-06 DIAGNOSIS — G4733 Obstructive sleep apnea (adult) (pediatric): Secondary | ICD-10-CM | POA: Diagnosis not present

## 2016-03-06 DIAGNOSIS — I10 Essential (primary) hypertension: Secondary | ICD-10-CM | POA: Diagnosis not present

## 2016-03-06 MED ORDER — AMLODIPINE BESYLATE 10 MG PO TABS
10.0000 mg | ORAL_TABLET | Freq: Every day | ORAL | Status: DC
  Administered 2016-03-06 (×2): 10 mg via ORAL
  Filled 2016-03-06 (×2): qty 1

## 2016-03-06 MED ORDER — AMLODIPINE BESYLATE 10 MG PO TABS: 10.0000 mg | ORAL_TABLET | Freq: Every day | ORAL | 0 refills | Status: DC

## 2016-03-06 NOTE — Progress Notes (Signed)
[name] at [place] NAME: Kaitlyn Ramos    MR#:  [id number]  DATE OF BIRTH:  Aug 27, 1927  she says she feels much better today, stable for discharge home, start BP medication at the discharge.  Physical therapy recommends physical therapy at the [facility], ENT follow-up.   CHIEF COMPLAINT:   Chief Complaint  Patient presents with  . Fall    REVIEW OF SYSTEMS:   ROS CONSTITUTIONAL: No fever, fatigue or weakness.  EYES: No blurred or double vision.  EARS, NOSE, AND THROAT: No tinnitus or ear pain.  RESPIRATORY: No cough, shortness of breath, wheezing or hemoptysis.  CARDIOVASCULAR: No chest pain, orthopnea, edema.  GASTROINTESTINAL: No nausea, vomiting, diarrhea or abdominal pain.  GENITOURINARY: No dysuria, hematuria.  ENDOCRINE: No polyuria, nocturia,  HEMATOLOGY: No anemia, easy bruising or bleeding SKIN: No rash or lesion. MUSCULOSKELETAL: No joint pain or arthritis.   NEUROLOGIC: No tingling, numbness, weakness. Complaints of dizziness. PSYCHIATRY: No anxiety or depression.   DRUG ALLERGIES:   Allergies  Allergen Reactions  . Codeine Sulfate Hives and Itching    Can take tramadol    VITALS:  Blood pressure (!) 173/73, pulse 71, temperature 98.1 F (36.7 C), resp. rate 18, height 5\' 1"  (1.549 m), weight 69.4 kg (153 lb), SpO2 95 %.  PHYSICAL EXAMINATION:  GENERAL:  82 y.o.-year-old patient lying in the bed with no acute distress.  EYES: Pupils equal, round, reactive to light and accommodation. No scleral icterus. Extraocular muscles intact.  HEENT: Head atraumatic, normocephalic. Oropharynx and nasopharynx clear.  NECK:  Supple, no jugular venous distention. No thyroid enlargement, no tenderness.  LUNGS: Normal breath sounds bilaterally, no wheezing, rales,rhonchi or crepitation. No use of accessory muscles of respiration.  CARDIOVASCULAR: S1, S2 normal. No murmurs, rubs, or gallops.  ABDOMEN: Soft, nontender,  nondistended. Bowel sounds present. No organomegaly or mass.  EXTREMITIES: No pedal edema, cyanosis, or clubbing.  NEUROLOGIC: Cranial nerves II through XII are intact. Muscle strength 5/5 in all extremities. Sensation intact. Gait not checked.  PSYCHIATRIC: The patient is alert and oriented x 3.  SKIN: No obvious rash, lesion, or ulcer.    LABORATORY PANEL:   CBC  Recent Labs Lab 03/03/16 1433  WBC 7.4  HGB 14.3  HCT 42.9  PLT 135*   ------------------------------------------------------------------------------------------------------------------  Chemistries   Recent Labs Lab 03/03/16 1433  NA 138  K 4.2  CL 103  CO2 27  GLUCOSE 103*  BUN 16  CREATININE 1.01*  CALCIUM 9.5  AST 22  ALT 15  ALKPHOS 57  BILITOT 1.1   ------------------------------------------------------------------------------------------------------------------  Cardiac Enzymes  Recent Labs Lab 03/04/16 0824  TROPONINI <0.03   ------------------------------------------------------------------------------------------------------------------  RADIOLOGY:  No results found.  EKG:   Orders placed or performed during the hospital encounter of 03/03/16  . ED EKG  . ED EKG    ASSESSMENT AND PLAN:   #1 syncope: Reviewed as: Troponins are negative cardiology consult pending.Patient has orthostatic changes of blood present in the emergency room. Echocardiogram showed EF more than 55%.   #2hypothyroid;on synthyroid #3 accelerated hypertension: Received hydralazine IV. Generalized weakness, dizziness: Physical therapy consult ed.    Recommends home health physical therapy. Chest X with home health physical therapy, patient says that she already has physical therapy following [facility]. Advise her to use Rollator all the time for ambulation and reduce the risk of falls. Physical therapy to be continued at [facility]. And patient is to follow-up with the ENT.  All the records are reviewed  and case discussed with Care Management/Social Workerr. Management plans discussed with the patient, family and they are in agreement.  CODE STATUS: full  TOTAL TIME TAKING CARE OF THIS PATIENT:35  minutes.    Epifanio Lesches M.D on 03/06/2016 at 9:53 AM  Between 7am to 6pm - Pager - [phone]  After 6pm go to [email or web address] - password [email or web address] [facility]  Office  [phone]  CC: Primary care physician; [place] Pink, MD   Note: This dictation was prepared with Dragon dictation along with smaller phrase technology. Any transcriptional errors that result from this process are unintentional.

## 2016-03-06 NOTE — Progress Notes (Signed)
Pt"s blood pressure 173/73 lyling, 170/95 sitting, 150/79. Notified MD, orders received. Will continue to monitor

## 2016-03-06 NOTE — Care Management Note (Addendum)
Case Management Note  Patient Details  Name: Kaitlyn Ramos MRN: [id number] Date of Birth: [birth year]  Subjective/Objective:       Discussed discharge planning with Kaitlyn Ramos. Her son-in-law will transport her home today. She already has a rollator at home as recommended by [facility]-PT. No home health services ordered by MD.              Action/Plan:   Expected Discharge Date:                  Expected Discharge Plan:     In-House Referral:     Discharge planning Services     Post Acute Care Choice:    Choice offered to:     DME Arranged:    DME Agency:     HH Arranged:    [place] Agency:     Status of Service:     If discussed at [employer] of Stay Meetings, dates discussed:    Additional Comments:  Eriyana Sweeten A, RN 03/06/2016, 9:00 AM

## 2016-03-06 NOTE — Progress Notes (Signed)
Pt discharged back to [facility] and son in law here to take her.  Discharge: Pt d/c from room via wheelchair, Family member with the pt. Discharge instructions given to the patient and family members.  No questions from pt, reintegrated to the pt to call or go to the ED for chest discomfort. Pt dressed in street clothes and left with discharge papers and prescriptions in hand. IV d/ced, tele removed and no complaints of pain or discomfort.

## 2016-03-06 NOTE — Progress Notes (Signed)
[facility] Cardiology [facility] Encounter Note  Patient: Kaitlyn Ramos / Admit Date: 03/03/2016 / Date of Encounter: 03/06/2016, 7:24 AM   Subjective: Patient is feeling better over the last 24 hours with less evidence of fatigue and weakness. Patient's blood pressure significantly elevated with reinstatement of medications and has now improved. Some orthostasis although no evidence of significant symptoms of orthostasis when changing positions. Sleep apnea well treated overnight Echocardiogram without evidence of significant causes of above  Review of Systems: Positive for: Some improvements of weakness and shortness of breath Negative for: Vision change, hearing change, syncope, dizziness, nausea, vomiting,diarrhea, bloody stool, stomach pain, cough, congestion, diaphoresis, urinary frequency, urinary pain,skin lesions, skin rashes Others previously listed  Objective: Telemetry: Normal sinus rhythm Physical Exam: Blood pressure (!) 173/73, pulse 71, temperature 98.1 F (36.7 C), resp. rate 18, height 5\' 1"  (1.549 m), weight 69.4 kg (153 lb), SpO2 95 %. Body mass index is 28.91 kg/m. General: Well developed, well nourished, in no acute distress. Head: Normocephalic, atraumatic, sclera non-icteric, no xanthomas, nares are without discharge. Neck: No apparent masses Lungs: Normal respirations with no wheezes, no rhonchi, no rales , few crackles   Heart: Regular rate and rhythm, normal S1 S2, no murmur, no rub, no gallop, PMI is normal size and placement, carotid upstroke normal without bruit, jugular venous pressure normal Abdomen: Soft, non-tender, non-distended with normoactive bowel sounds. No hepatosplenomegaly. Abdominal aorta is normal size without bruit Extremities: No edema, no clubbing, no cyanosis, no ulcers,  Peripheral: 2+ radial, 2+ femoral, 2+ dorsal pedal pulses Neuro: Alert and oriented. Moves all extremities spontaneously. Psych:  Responds to questions  appropriately with a normal affect.   Intake/Output Summary (Last 24 hours) at 03/06/16 0724 Last data filed at 03/06/16 0558  Gross per 24 hour  Intake              660 ml  Output             2500 ml  Net            -1840 ml    Inpatient Medications:  . amLODipine  10 mg Oral Daily  . calcium carbonate  1 tablet Oral Daily  . darifenacin  7.5 mg Oral Daily  . DULoxetine  30 mg Oral Daily  . enoxaparin (LOVENOX) injection  40 mg Subcutaneous Q24H  . etodolac  400 mg Oral BID  . levothyroxine  50 mcg Oral QAC breakfast  . multivitamin with minerals  1 tablet Oral Daily  . pantoprazole  40 mg Oral Daily  . rOPINIRole  0.5 mg Oral QHS  . simvastatin  20 mg Oral Daily  . sodium chloride flush  3 mL Intravenous Q12H  . cyanocobalamin  1,000 mcg Oral Daily   Infusions:  . sodium chloride 50 mL/hr at 03/05/16 1433    Labs:  Recent Labs  03/03/16 1433  NA 138  K 4.2  CL 103  CO2 27  GLUCOSE 103*  BUN 16  CREATININE 1.01*  CALCIUM 9.5    Recent Labs  03/03/16 1433  AST 22  ALT 15  ALKPHOS 57  BILITOT 1.1  PROT 7.7  ALBUMIN 4.5    Recent Labs  03/03/16 1433  WBC 7.4  NEUTROABS 5.9  HGB 14.3  HCT 42.9  MCV 86.5  PLT 135*    Recent Labs  03/03/16 1433 03/03/16 2106 03/04/16 0214 03/04/16 0824  TROPONINI <0.03 <0.03 <0.03 <0.03   Invalid input(s): POCBNP No results for input(s): HGBA1C in  the last 72 hours.   Weights: Filed Weights   03/03/16 1346  Weight: 69.4 kg (153 lb)     Radiology/Studies:  Ct Head Wo Contrast  Result Date: 03/03/2016 CLINICAL DATA:  Recurrent falls since July, 2017. Most recent fall occurred today. EXAM: CT HEAD WITHOUT CONTRAST TECHNIQUE: Contiguous axial images were obtained from the base of the skull through the vertex without intravenous contrast. COMPARISON:  Head CT scan 08/07/2015. FINDINGS: Brain: Cortical atrophy and chronic microvascular ischemic change are stable in appearance. No evidence of acute  abnormality including hemorrhage, infarct, mass lesion, mass effect, midline shift or abnormal extra-axial fluid collection. No hydrocephalus or pneumocephalus. Vascular: Atherosclerotic vascular disease is noted. Skull: Intact. Sinuses/Orbits: Negative. Other: None. IMPRESSION: No acute abnormality. Atrophy and chronic microvascular ischemic change. Electronically Signed   By: Inge Rise M.D.   On: 03/03/2016 14:26   Mr Angiogram Head W Or Wo Contrast  Result Date: 03/03/2016 CLINICAL DATA:  82 y/o F; 5 falls yesterday with dizziness and concern for posterior circulation pathology. EXAM: MRI HEAD WITHOUT CONTRAST MRA HEAD WITHOUT CONTRAST TECHNIQUE: Multiplanar, multiecho pulse sequences of the brain and surrounding structures were obtained without intravenous contrast. Angiographic images of the head were obtained using MRA technique without contrast. COMPARISON:  03/03/2016 CT head.  12/05/2008 MRI of the brain FINDINGS: MRI HEAD FINDINGS Brain: No acute infarction, hemorrhage, hydrocephalus, extra-axial collection or mass lesion. There are scattered foci of T2 FLAIR hyperintense signal abnormality in subcortical and periventricular white matter, subinsular white matter, punctate foci in the thalamus, and small foci throughout pons and midbrain compatible with moderate chronic microvascular ischemic changes which has progressed from 2010. Moderate diffuse parenchymal volume loss. Vascular: See below. Skull and upper cervical spine: Normal marrow signal. Sinuses/Orbits: Negative. Other: None. MRA HEAD FINDINGS Slab artifact at level of internal carotid arteries, upper basilar, and proximal posterior cerebral arteries as well as right M2 inferior division with diminished signal at level of artifact. Internal carotid arteries: Patent. Irregularity with probable mild stenosis of the paraclinoid segments is likely due to atherosclerosis. Anterior cerebral arteries:  Patent. Middle cerebral arteries: Patent.  Anterior communicating artery: Not identified, hypoplastic or absent. Posterior communicating arteries: Not identified, hypoplastic or absent. Posterior cerebral arteries:  Patent. Basilar artery:  Patent. Vertebral arteries:  Patent. IMPRESSION: 1. No acute intracranial abnormality identified. 2. Moderate chronic microvascular ischemic changes and parenchymal volume loss of the brain progressed from 2010. 3. Patent circle of Willis. No evidence for large vessel occlusion, high-grade stenosis, or aneurysm. Mild narrowing of carotid siphons likely due to atherosclerosis. Electronically Signed   By: Kristine Garbe M.D.   On: 03/03/2016 18:28   Mr Brain Wo Contrast  Result Date: 03/03/2016 CLINICAL DATA:  82 y/o F; 5 falls yesterday with dizziness and concern for posterior circulation pathology. EXAM: MRI HEAD WITHOUT CONTRAST MRA HEAD WITHOUT CONTRAST TECHNIQUE: Multiplanar, multiecho pulse sequences of the brain and surrounding structures were obtained without intravenous contrast. Angiographic images of the head were obtained using MRA technique without contrast. COMPARISON:  03/03/2016 CT head.  12/05/2008 MRI of the brain FINDINGS: MRI HEAD FINDINGS Brain: No acute infarction, hemorrhage, hydrocephalus, extra-axial collection or mass lesion. There are scattered foci of T2 FLAIR hyperintense signal abnormality in subcortical and periventricular white matter, subinsular white matter, punctate foci in the thalamus, and small foci throughout pons and midbrain compatible with moderate chronic microvascular ischemic changes which has progressed from 2010. Moderate diffuse parenchymal volume loss. Vascular: See below. Skull and upper cervical spine:  Normal marrow signal. Sinuses/Orbits: Negative. Other: None. MRA HEAD FINDINGS Slab artifact at level of internal carotid arteries, upper basilar, and proximal posterior cerebral arteries as well as right M2 inferior division with diminished signal at level of  artifact. Internal carotid arteries: Patent. Irregularity with probable mild stenosis of the paraclinoid segments is likely due to atherosclerosis. Anterior cerebral arteries:  Patent. Middle cerebral arteries: Patent. Anterior communicating artery: Not identified, hypoplastic or absent. Posterior communicating arteries: Not identified, hypoplastic or absent. Posterior cerebral arteries:  Patent. Basilar artery:  Patent. Vertebral arteries:  Patent. IMPRESSION: 1. No acute intracranial abnormality identified. 2. Moderate chronic microvascular ischemic changes and parenchymal volume loss of the brain progressed from 2010. 3. Patent circle of Willis. No evidence for large vessel occlusion, high-grade stenosis, or aneurysm. Mild narrowing of carotid siphons likely due to atherosclerosis. Electronically Signed   By: Kristine Garbe M.D.   On: 03/03/2016 18:28   Dg Finger Ring Left  Result Date: 03/03/2016 CLINICAL DATA:  Left ring finger pain after fall today. EXAM: LEFT RING FINGER 2+V COMPARISON:  None. 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 There is an acute closed fracture of an osteophyte off the dorsal lateral aspect of the left ring finger involving the base of the middle phalanx. It appears slightly displaced by 2 mm. There is joint space narrowing of the fifth MCP and triscaphe articulations as well. There is soft tissue swelling about the PIP joint. IMPRESSION: Acute, closed, minimally displaced fracture of an osteophyte off the dorsal and lateral aspect of the left middle phalanx of the ring finger. There is overlying soft tissue swelling. 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 Lesser degree of joint space narrowing is seen of the fifth MCP. Electronically Signed   By: Ashley Royalty M.D.   On: 03/03/2016 14:45      Assessment and Recommendation  82 y.o. female with known essential hypertension with now orthostatic hypotension having weakness and fatigue of unknown etiology with chronic kidney disease stage II sleep apnea and no current evidence of myocardial infarction with episode of syncope 1. Reinstatement of hypertension control with amlodipine knowing that the patient may still have mild orthostasis although no evidence of significant signs or symptoms of orthostasis may be able to proceed on to further ambulation 2. Continue ambulation and possible rehabilitation and/or possible discharge to home  3. Hydration for chronic kidney disease 4. Continue treatment of sleep apnea with CPAP machine 5. High intensity cholesterol therapy with simvastatin 6. Okay for discharge home from cardiac standpoint if ambulating well with no further symptoms with adjustments of medication management as outpatient next week  Signed, Serafina Royals M.D. FACC

## 2016-03-08 DIAGNOSIS — R42 Dizziness and giddiness: Secondary | ICD-10-CM | POA: Diagnosis not present

## 2016-03-09 DIAGNOSIS — R2681 Unsteadiness on feet: Secondary | ICD-10-CM | POA: Diagnosis not present

## 2016-03-09 DIAGNOSIS — R4189 Other symptoms and signs involving cognitive functions and awareness: Secondary | ICD-10-CM | POA: Diagnosis not present

## 2016-03-09 NOTE — Discharge Summary (Signed)
Kaitlyn Ramos, is a 82 y.o. female  DOB [birth year]  MRN [id number].  Admission date:  03/03/2016  Admitting Physician  Harrel Lemon, MD  Discharge Date:  03/06/2016   Primary MD  [place] Pink, MD  Recommendations for primary care physician for things to follow:   Follow-up with primary doctor in 1 week Follow up with ENT Dr. Carloyn Manner for eval of Vestibula neuronitis ,for her dizzines eval.  Admission Diagnosis  Dizziness [R42] Orthostasis [I95.1] Frequent falls [R29.6] Closed displaced fracture of middle phalanx of left ring finger, initial encounter [S62.625A]   Discharge Diagnosis  Dizziness [R42] Orthostasis [I95.1] Frequent falls [R29.6] Closed displaced fracture of middle phalanx of left ring finger, initial encounter [id number]    Active Problems:   Syncope      Past Medical History:  Diagnosis Date  . Cervicalgia   . Depression   . Essential hypertension   . Hemorrhoids   . History of MRSA infection   . Hyperlipidemia   . Hypertension   . Hypothyroidism   . IBS (irritable bowel syndrome)   . Osteoarthritis of multiple joints   . Restless leg   . Senile osteoporosis   . Shingles   . Sleep apnea   . Ulcerative colitis ([place])   . Urge incontinence     Past Surgical History:  Procedure Laterality Date  . ABDOMINAL HYSTERECTOMY  1980  . cataract surgery    . CHOLECYSTECTOMY  1955  . colon polyp removal  12/2012  . ESOPHAGOGASTRODUODENOSCOPY  2014  . makoplasty Left 06/2012  . parotid gland removal  1984       History of present illness and  Hospital Course:     Kindly see H&P for history of present illness and admission details, please review complete Labs, Consult reports and Test reports for all details in brief  HPI  from the history and physical done on the day of  admission  This 82 year old female patient came in because of dizziness, syncope. Patient had no chest pain or shortness of breath. Had orthostatic hypotension.  Hospital Course  #1 syncope: Patient vasovagal  syncope with orthostatic hypotension.. Patient initial blood pressure was 240 lying down, 150 while standing systolic blood pressure. Continue telemetry for evaluation of her echo, consult placed. Because of orthostatic hypotension Norvasc is held on admission. Also started on IV hydration. Echocardiogram showed EF more than 55%. Troponins are negative. Seen by cardiology, they did not recommend any further workup. 2. blood pressure: Patient has significantly elevated blood pressure in the hospital stay ; so restarted the Norvasc. #3. History of sleep apnea: Continue CPAP at night  #4 hyperlipidemia: Continue simvastatin  5.Weakness, fatigue likely due to dehydration: Patient received IV hydration, she said fluids helped her.  #6. Dizziness, syncopal episodes, falls at home: No loss of consciousness.  patient advised to follow-up with ENT for vng  study to rule out any vestibular dysfunction that may be contributing to her loss of balance. Physical therapy recommended home health physical therapy at her apartment at [facility] [address]. Advised her to use Rollator all the time. #7]. Closed fracture of left ring finger: Patient already has a buddy tape for that.  Discharge Condition: stable   Follow UP  Follow-up Information    MENZ,MICHAEL, MD.   Specialty:  Orthopedic Surgery Why:  PLEASE CALL OFFICE AS SOON AS POSSIBLE TO SCHEDULE THIS APPOINTMENT Contact information: [address] [place] [address] [phone]  Discharge Instructions  and  Discharge Medications        Medication List    TAKE these medications   amLODipine 10 MG tablet Commonly known as:  NORVASC Take 1 tablet (10 mg total) by mouth daily.   CALCIUM  500 + D 500-125 MG-UNIT Tabs Generic drug:  Calcium Carbonate-Vitamin D Take 1 tablet by mouth daily.   cyanocobalamin 1000 MCG tablet Take 1,000 mcg by mouth daily.   DULoxetine 30 MG capsule Commonly known as:  CYMBALTA Take 30 mg by mouth daily.   etodolac 400 MG tablet Commonly known as:  LODINE Take 400 mg by mouth 2 (two) times daily.   fluticasone 50 MCG/ACT nasal spray Commonly known as:  FLONASE Place into the nose.   levothyroxine 50 MCG tablet Commonly known as:  SYNTHROID, LEVOTHROID Take 50 mcg by mouth daily before breakfast.   MULTIVITAMIN ADULT PO Take by mouth daily.   omeprazole 20 MG capsule Commonly known as:  PRILOSEC Take 20 mg by mouth daily.   ondansetron 4 MG disintegrating tablet Commonly known as:  ZOFRAN ODT Take 1 tablet (4 mg total) by mouth every 8 (eight) hours as needed for nausea or vomiting.   rOPINIRole 0.25 MG tablet Commonly known as:  REQUIP Take 0.5 mg by mouth at bedtime.   simvastatin 20 MG tablet Commonly known as:  ZOCOR Take 20 mg by mouth daily.   VESICARE 5 MG tablet Generic drug:  solifenacin TAKE 1 TABLET BY MOUTH DAILY   vitamin E 400 UNIT capsule Take 400 Units by mouth daily.         Diet and Activity recommendation: See Discharge Instructions above   Consults obtained - cardio,PT   Major procedures and Radiology Reports - PLEASE review detailed and final reports for all details, in brief -      Ct Head Wo Contrast  Result Date: 03/03/2016 CLINICAL DATA:  Recurrent falls since July, 2017. Most recent fall occurred today. EXAM: CT HEAD WITHOUT CONTRAST TECHNIQUE: Contiguous axial images were obtained from the base of the skull through the vertex without intravenous contrast. COMPARISON:  Head CT scan 08/07/2015. FINDINGS: Brain: Cortical atrophy and chronic microvascular ischemic change are stable in appearance. No evidence of acute abnormality including hemorrhage, infarct, mass lesion, mass  effect, midline shift or abnormal extra-axial fluid collection. No hydrocephalus or pneumocephalus. Vascular: Atherosclerotic vascular disease is noted. Skull: Intact. Sinuses/Orbits: Negative. Other: None. IMPRESSION: No acute abnormality. Atrophy and chronic microvascular ischemic change. Electronically Signed   By: Inge Rise M.D.   On: 03/03/2016 14:26   Mr Angiogram Head W Or Wo Contrast  Result Date: 03/03/2016 CLINICAL DATA:  82 y/o F; 5 falls yesterday with dizziness and concern for posterior circulation pathology. EXAM: MRI HEAD WITHOUT CONTRAST MRA HEAD WITHOUT CONTRAST TECHNIQUE: Multiplanar, multiecho pulse sequences of the brain and surrounding structures were obtained without intravenous contrast. Angiographic images of the head were obtained using MRA technique without contrast. COMPARISON:  03/03/2016 CT head.  12/05/2008 MRI of the brain FINDINGS: MRI HEAD FINDINGS Brain: No acute infarction, hemorrhage, hydrocephalus, extra-axial collection or mass lesion. There are scattered foci of T2 FLAIR hyperintense signal abnormality in subcortical and periventricular white matter, subinsular white matter, punctate foci in the thalamus, and small foci throughout pons and midbrain compatible with moderate chronic microvascular ischemic changes which has progressed from 2010. Moderate diffuse parenchymal volume loss. Vascular: See below. Skull and upper cervical spine: Normal marrow signal. Sinuses/Orbits: Negative. Other: None. MRA HEAD FINDINGS Slab  artifact at level of internal carotid arteries, upper basilar, and proximal posterior cerebral arteries as well as right M2 inferior division with diminished signal at level of artifact. Internal carotid arteries: Patent. Irregularity with probable mild stenosis of the paraclinoid segments is likely due to atherosclerosis. Anterior cerebral arteries:  Patent. Middle cerebral arteries: Patent. Anterior communicating artery: Not identified, hypoplastic  or absent. Posterior communicating arteries: Not identified, hypoplastic or absent. Posterior cerebral arteries:  Patent. Basilar artery:  Patent. Vertebral arteries:  Patent. IMPRESSION: 1. No acute intracranial abnormality identified. 2. Moderate chronic microvascular ischemic changes and parenchymal volume loss of the brain progressed from 2010. 3. Patent circle of Willis. No evidence for large vessel occlusion, high-grade stenosis, or aneurysm. Mild narrowing of carotid siphons likely due to atherosclerosis. Electronically Signed   By: Kristine Garbe M.D.   On: 03/03/2016 18:28   Mr Brain Wo Contrast  Result Date: 03/03/2016 CLINICAL DATA:  82 y/o F; 5 falls yesterday with dizziness and concern for posterior circulation pathology. EXAM: MRI HEAD WITHOUT CONTRAST MRA HEAD WITHOUT CONTRAST TECHNIQUE: Multiplanar, multiecho pulse sequences of the brain and surrounding structures were obtained without intravenous contrast. Angiographic images of the head were obtained using MRA technique without contrast. COMPARISON:  03/03/2016 CT head.  12/05/2008 MRI of the brain FINDINGS: MRI HEAD FINDINGS Brain: No acute infarction, hemorrhage, hydrocephalus, extra-axial collection or mass lesion. There are scattered foci of T2 FLAIR hyperintense signal abnormality in subcortical and periventricular white matter, subinsular white matter, punctate foci in the thalamus, and small foci throughout pons and midbrain compatible with moderate chronic microvascular ischemic changes which has progressed from 2010. Moderate diffuse parenchymal volume loss. Vascular: See below. Skull and upper cervical spine: Normal marrow signal. Sinuses/Orbits: Negative. Other: None. MRA HEAD FINDINGS Slab artifact at level of internal carotid arteries, upper basilar, and proximal posterior cerebral arteries as well as right M2 inferior division with diminished signal at level of artifact. Internal carotid arteries: Patent. Irregularity  with probable mild stenosis of the paraclinoid segments is likely due to atherosclerosis. Anterior cerebral arteries:  Patent. Middle cerebral arteries: Patent. Anterior communicating artery: Not identified, hypoplastic or absent. Posterior communicating arteries: Not identified, hypoplastic or absent. Posterior cerebral arteries:  Patent. Basilar artery:  Patent. Vertebral arteries:  Patent. IMPRESSION: 1. No acute intracranial abnormality identified. 2. Moderate chronic microvascular ischemic changes and parenchymal volume loss of the brain progressed from 2010. 3. Patent circle of Willis. No evidence for large vessel occlusion, high-grade stenosis, or aneurysm. Mild narrowing of carotid siphons likely due to atherosclerosis. Electronically Signed   By: Kristine Garbe M.D.   On: 03/03/2016 18:28   Dg Finger Ring Left  Result Date: 03/03/2016 CLINICAL DATA:  Left ring finger pain after fall today. EXAM: LEFT RING FINGER 2+V COMPARISON:  None. 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 There is an acute closed fracture of an osteophyte off the dorsal lateral aspect of the left ring finger involving the base of the middle phalanx. It appears slightly displaced by 2 mm. There is joint space narrowing of the fifth MCP and triscaphe articulations as well. There is soft tissue swelling about the PIP joint. IMPRESSION: Acute, closed, minimally displaced fracture of an osteophyte off the dorsal and lateral aspect of the left middle phalanx of the ring finger. There is overlying soft tissue swelling. There is erosive osteoarthritis the DIP and PIP joints of visualized fourth and fifth fingers as well as osteoarthritis  of the triscaphe joint. Lesser degree of joint space narrowing is seen of the fifth MCP. Electronically Signed   By: Ashley Royalty M.D.   On: 03/03/2016 14:45    Micro Results     No results found for this or any  previous visit (from the past 240 hour(s)).     Today   Subjective:   Kaitlyn Ramos today has no headache,no chest abdominal pain,no new weakness tingling or numbness, feels much better wants to go home today.   Objective:   Blood pressure (!) 162/83, pulse 84, temperature 97.6 F (36.4 C), temperature source Oral, resp. rate 16, height 5\' 1"  (1.549 m), weight 69.4 kg (153 lb), SpO2 95 %.  No intake or output data in the 24 hours ending 03/09/16 1904  Exam Awake Alert, Oriented x 3, No new F.N deficits, Normal affect [place].AT,PERRAL Supple Neck,No JVD, No cervical lymphadenopathy appriciated.  Symmetrical Chest wall movement, Good air movement bilaterally, CTAB RRR,No Gallops,Rubs or new Murmurs, No Parasternal Heave +ve B.Sounds, Abd Soft, Non tender, No organomegaly appriciated, No rebound -guarding or rigidity. No Cyanosis, Clubbing or edema, No new Rash or bruise  Data Review   CBC w Diff:  Lab Results  Component Value Date   WBC 7.4 03/03/2016   HGB 14.3 03/03/2016   HGB 13.0 03/05/2013   HCT 42.9 03/03/2016   HCT 38.5 03/05/2013   PLT 135 (L) 03/03/2016   PLT 162 03/05/2013   LYMPHOPCT 13 03/03/2016   MONOPCT 6 03/03/2016   EOSPCT 1 03/03/2016   BASOPCT 1 03/03/2016    CMP:  Lab Results  Component Value Date   NA 138 03/03/2016   NA 137 03/05/2013   K 4.2 03/03/2016   K 3.9 03/05/2013   CL 103 03/03/2016   CL 106 03/05/2013   CO2 27 03/03/2016   CO2 26 03/05/2013   BUN 16 03/03/2016   BUN 16 03/05/2013   CREATININE 1.01 (H) 03/03/2016   CREATININE 0.85 03/05/2013   PROT 7.7 03/03/2016   PROT 7.0 10/13/2014   PROT 7.0 03/05/2013   ALBUMIN 4.5 03/03/2016   ALBUMIN 4.5 10/13/2014   ALBUMIN 3.7 03/05/2013   BILITOT 1.1 03/03/2016   BILITOT 0.6 10/13/2014   BILITOT 0.5 03/05/2013   ALKPHOS 57 03/03/2016   ALKPHOS 67 03/05/2013   AST 22 03/03/2016   AST 24 03/05/2013   ALT 15 03/03/2016   ALT 22 03/05/2013  .   Total Time in preparing paper  work, data evaluation and todays exam - 66 minutes  Dmitry Macomber M.D on 03/06/2016 at 7:04 PM    Note: This dictation was prepared with Dragon dictation along with smaller phrase technology. Any transcriptional errors that result from this process are unintentional.

## 2016-03-10 DIAGNOSIS — R4189 Other symptoms and signs involving cognitive functions and awareness: Secondary | ICD-10-CM | POA: Diagnosis not present

## 2016-03-10 DIAGNOSIS — R2681 Unsteadiness on feet: Secondary | ICD-10-CM | POA: Diagnosis not present

## 2016-03-11 DIAGNOSIS — G2581 Restless legs syndrome: Secondary | ICD-10-CM | POA: Diagnosis not present

## 2016-03-11 DIAGNOSIS — F39 Unspecified mood [affective] disorder: Secondary | ICD-10-CM

## 2016-03-11 DIAGNOSIS — W19XXXA Unspecified fall, initial encounter: Secondary | ICD-10-CM | POA: Diagnosis not present

## 2016-03-11 DIAGNOSIS — R55 Syncope and collapse: Secondary | ICD-10-CM | POA: Diagnosis not present

## 2016-03-11 DIAGNOSIS — I10 Essential (primary) hypertension: Secondary | ICD-10-CM | POA: Diagnosis not present

## 2016-03-12 DIAGNOSIS — R4189 Other symptoms and signs involving cognitive functions and awareness: Secondary | ICD-10-CM | POA: Diagnosis not present

## 2016-03-12 DIAGNOSIS — R2681 Unsteadiness on feet: Secondary | ICD-10-CM | POA: Diagnosis not present

## 2016-03-13 DIAGNOSIS — R4189 Other symptoms and signs involving cognitive functions and awareness: Secondary | ICD-10-CM | POA: Diagnosis not present

## 2016-03-13 DIAGNOSIS — R2681 Unsteadiness on feet: Secondary | ICD-10-CM | POA: Diagnosis not present

## 2016-03-14 DIAGNOSIS — R2681 Unsteadiness on feet: Secondary | ICD-10-CM | POA: Diagnosis not present

## 2016-03-14 DIAGNOSIS — R4189 Other symptoms and signs involving cognitive functions and awareness: Secondary | ICD-10-CM | POA: Diagnosis not present

## 2016-03-15 DIAGNOSIS — R2681 Unsteadiness on feet: Secondary | ICD-10-CM | POA: Diagnosis not present

## 2016-03-15 DIAGNOSIS — G4733 Obstructive sleep apnea (adult) (pediatric): Secondary | ICD-10-CM | POA: Diagnosis not present

## 2016-03-15 DIAGNOSIS — R42 Dizziness and giddiness: Secondary | ICD-10-CM | POA: Diagnosis not present

## 2016-03-15 DIAGNOSIS — I10 Essential (primary) hypertension: Secondary | ICD-10-CM | POA: Diagnosis not present

## 2016-03-15 DIAGNOSIS — E782 Mixed hyperlipidemia: Secondary | ICD-10-CM | POA: Diagnosis not present

## 2016-03-15 DIAGNOSIS — R4189 Other symptoms and signs involving cognitive functions and awareness: Secondary | ICD-10-CM | POA: Diagnosis not present

## 2016-03-16 DIAGNOSIS — R2681 Unsteadiness on feet: Secondary | ICD-10-CM | POA: Diagnosis not present

## 2016-03-16 DIAGNOSIS — R4189 Other symptoms and signs involving cognitive functions and awareness: Secondary | ICD-10-CM | POA: Diagnosis not present

## 2016-03-17 DIAGNOSIS — R2681 Unsteadiness on feet: Secondary | ICD-10-CM | POA: Diagnosis not present

## 2016-03-17 DIAGNOSIS — R4189 Other symptoms and signs involving cognitive functions and awareness: Secondary | ICD-10-CM | POA: Diagnosis not present

## 2016-03-18 DIAGNOSIS — R2681 Unsteadiness on feet: Secondary | ICD-10-CM | POA: Diagnosis not present

## 2016-03-18 DIAGNOSIS — R42 Dizziness and giddiness: Secondary | ICD-10-CM | POA: Diagnosis not present

## 2016-03-18 DIAGNOSIS — R4189 Other symptoms and signs involving cognitive functions and awareness: Secondary | ICD-10-CM | POA: Diagnosis not present

## 2016-03-19 DIAGNOSIS — R4189 Other symptoms and signs involving cognitive functions and awareness: Secondary | ICD-10-CM | POA: Diagnosis not present

## 2016-03-19 DIAGNOSIS — R2681 Unsteadiness on feet: Secondary | ICD-10-CM | POA: Diagnosis not present

## 2016-03-19 DIAGNOSIS — R42 Dizziness and giddiness: Secondary | ICD-10-CM | POA: Diagnosis not present

## 2016-03-20 DIAGNOSIS — R4189 Other symptoms and signs involving cognitive functions and awareness: Secondary | ICD-10-CM | POA: Diagnosis not present

## 2016-03-20 DIAGNOSIS — R2681 Unsteadiness on feet: Secondary | ICD-10-CM | POA: Diagnosis not present

## 2016-03-20 DIAGNOSIS — R42 Dizziness and giddiness: Secondary | ICD-10-CM | POA: Diagnosis not present

## 2016-03-21 DIAGNOSIS — R42 Dizziness and giddiness: Secondary | ICD-10-CM | POA: Diagnosis not present

## 2016-03-21 DIAGNOSIS — R2681 Unsteadiness on feet: Secondary | ICD-10-CM | POA: Diagnosis not present

## 2016-03-21 DIAGNOSIS — R4189 Other symptoms and signs involving cognitive functions and awareness: Secondary | ICD-10-CM | POA: Diagnosis not present

## 2016-03-22 ENCOUNTER — Encounter: Payer: Self-pay | Admitting: Urology

## 2016-03-22 ENCOUNTER — Ambulatory Visit: Payer: Medicare Other | Admitting: Urology

## 2016-03-22 DIAGNOSIS — R42 Dizziness and giddiness: Secondary | ICD-10-CM | POA: Diagnosis not present

## 2016-03-22 DIAGNOSIS — R4189 Other symptoms and signs involving cognitive functions and awareness: Secondary | ICD-10-CM | POA: Diagnosis not present

## 2016-03-22 DIAGNOSIS — R2681 Unsteadiness on feet: Secondary | ICD-10-CM | POA: Diagnosis not present

## 2016-03-22 DIAGNOSIS — G2581 Restless legs syndrome: Secondary | ICD-10-CM | POA: Diagnosis not present

## 2016-03-22 DIAGNOSIS — E785 Hyperlipidemia, unspecified: Secondary | ICD-10-CM | POA: Diagnosis not present

## 2016-03-22 DIAGNOSIS — W010XXA Fall on same level from slipping, tripping and stumbling without subsequent striking against object, initial encounter: Secondary | ICD-10-CM | POA: Diagnosis not present

## 2016-03-22 DIAGNOSIS — N3941 Urge incontinence: Secondary | ICD-10-CM | POA: Diagnosis not present

## 2016-03-22 NOTE — Progress Notes (Deleted)
    8:30 AM   Kaitlyn Ramos [birth year] [id number]  Referring provider: Maryland Pink, MD [address] [facility] [place], [place] [address]  No chief complaint on file.   HPI: Kaitlyn Ramos  is an 82 year old Caucasian female who suffers with urge incontinence who presents today for PTNS treatment. This is a maintenance treatment.    Patient has been suffering with urge incontinence for last few years. She does not recall a specific event that set off the incontinence.  She has been experiencing a decrease in her incontinence and urgency since starting the PTNS.  She has tried fluid restrictions and Kegel exercises without improvement of her urinary symptoms when she presented to [place] in February, she had a trial of Myrbetriq which she found ineffective. She has restarted the Vesicare.    She does not have any contraindications presents for PTNS, such as: Pacemaker, implantable defibrillator, history of abnormal bleeding, history of neuropathy or nerve damage or pregnancy.    We discussed the possible complication of the procedure, such as: Discomfort, bleeding at the insertion/stimulation site and procedure consent is signed.  Her goals for therapy are reduced urgency and no accidents.  After her last treatment,  she is currently experiencing 0 (improved) leakage episode a day. She is experiencing a daytime frequency of 4 (stable) trips to the restroom daily.  She admits to nocturia 1 to 2 times nightly (improved). She does not have pain or burning with urination. She has a mild urge to urinate. She uses poise pads and finding them dry more often.  She is still wearing depends at night for security.  She does not consume alcohol and she drinks 2 caffeinated beverages daily.    There were no vitals taken for this visit.  Constitutional: Well nourished. Alert and oriented, No acute distress. HEENT: [place] AT, moist mucus membranes. Trachea midline, no masses. Cardiovascular:  No clubbing, cyanosis, or edema. Respiratory: Normal respiratory effort, no increased work of breathing. Skin: No rashes, bruises or suspicious lesions. Lymph: No cervical or inguinal adenopathy. Neurologic: Grossly intact, no focal deficits, moving all 4 extremities. Psychiatric: Normal mood and affect.  ROS        Assessment & Plan:    1. Urge incontinence-  Patient feels that she is responding well to the PTNS treatment.  She will RTC in 6 weeks for her maintenance PTNS.     PTNS treatment: The patient's right ankle is cleansed with rubbing alcohol. The needle electrode was inserted into the lower, inner aspect of patient's right leg.  A surface electrode was placed on the inside arch of the foot on the treatment leg. The lead set was connected to the stimulator and the needle electrode clip was connected to the needle electrode. The stimulator that produces an adjustable electrical pulse that travels to sacral nerve plexus via the tibial and nerve was increased to 3 and patient received a sensory response and a toe flex to the stimulus. She tolerated the procedure well for 30 minutes.      RTC in 6 weeks for PTNS  Zara Council, [facility] [address], [place], [place] [address] [phone]

## 2016-03-25 DIAGNOSIS — Z87898 Personal history of other specified conditions: Secondary | ICD-10-CM | POA: Diagnosis not present

## 2016-03-25 DIAGNOSIS — R002 Palpitations: Secondary | ICD-10-CM | POA: Diagnosis not present

## 2016-04-04 DIAGNOSIS — R42 Dizziness and giddiness: Secondary | ICD-10-CM | POA: Diagnosis not present

## 2016-04-07 DIAGNOSIS — R42 Dizziness and giddiness: Secondary | ICD-10-CM | POA: Diagnosis not present

## 2016-04-07 DIAGNOSIS — N3281 Overactive bladder: Secondary | ICD-10-CM | POA: Diagnosis not present

## 2016-04-07 DIAGNOSIS — I10 Essential (primary) hypertension: Secondary | ICD-10-CM | POA: Diagnosis not present

## 2016-04-13 DIAGNOSIS — I10 Essential (primary) hypertension: Secondary | ICD-10-CM | POA: Diagnosis not present

## 2016-04-13 DIAGNOSIS — R002 Palpitations: Secondary | ICD-10-CM | POA: Diagnosis not present

## 2016-04-19 DIAGNOSIS — R4189 Other symptoms and signs involving cognitive functions and awareness: Secondary | ICD-10-CM | POA: Diagnosis not present

## 2016-04-19 DIAGNOSIS — R278 Other lack of coordination: Secondary | ICD-10-CM | POA: Diagnosis not present

## 2016-04-19 DIAGNOSIS — R2681 Unsteadiness on feet: Secondary | ICD-10-CM | POA: Diagnosis not present

## 2016-04-20 DIAGNOSIS — R2681 Unsteadiness on feet: Secondary | ICD-10-CM | POA: Diagnosis not present

## 2016-04-20 DIAGNOSIS — R278 Other lack of coordination: Secondary | ICD-10-CM | POA: Diagnosis not present

## 2016-04-20 DIAGNOSIS — R4189 Other symptoms and signs involving cognitive functions and awareness: Secondary | ICD-10-CM | POA: Diagnosis not present

## 2016-04-21 DIAGNOSIS — R2681 Unsteadiness on feet: Secondary | ICD-10-CM | POA: Diagnosis not present

## 2016-04-21 DIAGNOSIS — R278 Other lack of coordination: Secondary | ICD-10-CM | POA: Diagnosis not present

## 2016-04-21 DIAGNOSIS — R4189 Other symptoms and signs involving cognitive functions and awareness: Secondary | ICD-10-CM | POA: Diagnosis not present

## 2016-04-25 ENCOUNTER — Ambulatory Visit (INDEPENDENT_AMBULATORY_CARE_PROVIDER_SITE_OTHER): Payer: Medicare Other | Admitting: Urology

## 2016-04-25 ENCOUNTER — Encounter: Payer: Self-pay | Admitting: Urology

## 2016-04-25 VITALS — BP 189/95 | HR 56 | Ht 60.0 in | Wt 154.4 lb

## 2016-04-25 DIAGNOSIS — N3941 Urge incontinence: Secondary | ICD-10-CM | POA: Diagnosis not present

## 2016-04-25 LAB — PTNS-PERCUTANEOUS TIBIAL NERVE STIMULATION: SCAN RESULT: 6

## 2016-04-25 NOTE — Progress Notes (Signed)
4:00 PM   ADAMINA LAVELLE [birth year] [id number]  Referring provider: Maryland Pink, MD [address] [facility] [place], [place] [address]  Chief Complaint  Patient presents with  . PTNS    Urinary Urgency    HPI: Mrs. Bucknam  is an 83 year old Caucasian female who suffers with urge incontinence who presents today for PTNS treatment. This is a maintenance treatment.    Patient has been suffering with urge incontinence for last few years. She does not recall a specific event that set off the incontinence.  She has been experiencing a decrease in her incontinence and urgency since starting the PTNS.  After her last treatment,  she is currently experiencing 0 (stable) leakage episode a day. She is experiencing a daytime frequency of 4 (stable) trips to the restroom daily.  She admits to nocturia 1 (improved). She does not have pain or burning with urination. She has a mild urge to urinate.  She is still wearing depends at night for security.  She does not consume alcohol and she drinks 2 caffeinated beverages daily.  Today, she is experiencing urgency and nocturia.  She denies dysuria, gross hematuria and suprapubic pain.  She has not had recent fevers, chills, nausea or vomiting.    She has tried fluid restrictions and Kegel exercises without improvement of her urinary symptoms when she presented to [place] in February, she had a trial of Myrbetriq which she found ineffective. She has restarted the Vesicare.    She does not have any contraindications presents for PTNS, such as: Pacemaker, implantable defibrillator, history of abnormal bleeding, history of neuropathy or nerve damage or pregnancy.    Her goals for therapy are reduced urgency and no accidents.  ROS  UROLOGY Frequent Urination?: No Hard to postpone urination?: Yes Burning/pain with urination?: No Get up at night to urinate?: Yes Leakage of urine?: No Urine stream starts and stops?: No Trouble starting  stream?: No Do you have to strain to urinate?: No Blood in urine?: No Urinary tract infection?: No Sexually transmitted disease?: No Injury to kidneys or bladder?: No Painful intercourse?: No Weak stream?: No Currently pregnant?: No Vaginal bleeding?: No Last menstrual period?: n Gastrointestinal Nausea?: No Vomiting?: No Indigestion/heartburn?: No Diarrhea?: No Constipation?: No Constitutional Fever: No Night sweats?: No Weight loss?: No Fatigue?: Yes Skin Skin rash/lesions?: No Itching?: No Eyes Blurred vision?: Yes Double vision?: No Ears/Nose/Throat Sore throat?: No Sinus problems?: No Hematologic/Lymphatic Swollen glands?: No Easy bruising?: No Cardiovascular Leg swelling?: No Chest pain?: No Respiratory Cough?: No Shortness of breath?: No Endocrine Excessive thirst?: No Musculoskeletal Back pain?: No Joint pain?: No Neurological Headaches?: No Dizziness?: Yes Psychologic Depression?: No Anxiety?: No  Physical Exam Blood pressure (!) 189/95, pulse (!) 56, height 5' (1.524 m), weight 154 lb 6.4 oz (70 kg).  Constitutional: Well nourished. Alert and oriented, No acute distress. HEENT: [place] AT, moist mucus membranes. Trachea midline, no masses. Cardiovascular: No clubbing, cyanosis, or edema. Respiratory: Normal respiratory effort, no increased work of breathing. Skin: No rashes, bruises or suspicious lesions. Lymph: No cervical or inguinal adenopathy. Neurologic: Grossly intact, no focal deficits, moving all 4 extremities. Psychiatric: Normal mood and affect.       Assessment & Plan:    1. Urge incontinence-  Patient feels that she is responding well to the PTNS treatment.  She will RTC in 6 weeks for her maintenance PTNS.     PTNS treatment: The patient's right ankle is cleansed with rubbing alcohol. The needle  electrode was inserted into the lower, inner aspect of patient's right leg.  A surface electrode was placed on the inside arch of the  foot on the treatment leg. The lead set was connected to the stimulator and the needle electrode clip was connected to the needle electrode. The stimulator that produces an adjustable electrical pulse that travels to sacral nerve plexus via the tibial and nerve was increased to 6 and patient received a sensory response and a toe flex to the stimulus. She tolerated the procedure well for 30 minutes.      RTC in 6 weeks for PTNS  Zara Council, [facility] [address] [place], [place] [address] [phone]

## 2016-04-29 DIAGNOSIS — R2681 Unsteadiness on feet: Secondary | ICD-10-CM | POA: Diagnosis not present

## 2016-04-29 DIAGNOSIS — R278 Other lack of coordination: Secondary | ICD-10-CM | POA: Diagnosis not present

## 2016-04-29 DIAGNOSIS — R4189 Other symptoms and signs involving cognitive functions and awareness: Secondary | ICD-10-CM | POA: Diagnosis not present

## 2016-05-02 DIAGNOSIS — R2681 Unsteadiness on feet: Secondary | ICD-10-CM | POA: Diagnosis not present

## 2016-05-02 DIAGNOSIS — R4189 Other symptoms and signs involving cognitive functions and awareness: Secondary | ICD-10-CM | POA: Diagnosis not present

## 2016-05-02 DIAGNOSIS — R278 Other lack of coordination: Secondary | ICD-10-CM | POA: Diagnosis not present

## 2016-05-05 DIAGNOSIS — R4189 Other symptoms and signs involving cognitive functions and awareness: Secondary | ICD-10-CM | POA: Diagnosis not present

## 2016-05-05 DIAGNOSIS — R278 Other lack of coordination: Secondary | ICD-10-CM | POA: Diagnosis not present

## 2016-05-05 DIAGNOSIS — R2681 Unsteadiness on feet: Secondary | ICD-10-CM | POA: Diagnosis not present

## 2016-05-06 DIAGNOSIS — R2681 Unsteadiness on feet: Secondary | ICD-10-CM | POA: Diagnosis not present

## 2016-05-06 DIAGNOSIS — R4189 Other symptoms and signs involving cognitive functions and awareness: Secondary | ICD-10-CM | POA: Diagnosis not present

## 2016-05-06 DIAGNOSIS — R278 Other lack of coordination: Secondary | ICD-10-CM | POA: Diagnosis not present

## 2016-05-09 DIAGNOSIS — R278 Other lack of coordination: Secondary | ICD-10-CM | POA: Diagnosis not present

## 2016-05-09 DIAGNOSIS — R2681 Unsteadiness on feet: Secondary | ICD-10-CM | POA: Diagnosis not present

## 2016-05-09 DIAGNOSIS — R4189 Other symptoms and signs involving cognitive functions and awareness: Secondary | ICD-10-CM | POA: Diagnosis not present

## 2016-05-11 DIAGNOSIS — R4189 Other symptoms and signs involving cognitive functions and awareness: Secondary | ICD-10-CM | POA: Diagnosis not present

## 2016-05-11 DIAGNOSIS — R2681 Unsteadiness on feet: Secondary | ICD-10-CM | POA: Diagnosis not present

## 2016-05-11 DIAGNOSIS — R278 Other lack of coordination: Secondary | ICD-10-CM | POA: Diagnosis not present

## 2016-05-12 DIAGNOSIS — I10 Essential (primary) hypertension: Secondary | ICD-10-CM | POA: Diagnosis not present

## 2016-05-12 DIAGNOSIS — E782 Mixed hyperlipidemia: Secondary | ICD-10-CM | POA: Diagnosis not present

## 2016-05-12 DIAGNOSIS — G4733 Obstructive sleep apnea (adult) (pediatric): Secondary | ICD-10-CM | POA: Diagnosis not present

## 2016-05-13 DIAGNOSIS — R278 Other lack of coordination: Secondary | ICD-10-CM | POA: Diagnosis not present

## 2016-05-13 DIAGNOSIS — R4189 Other symptoms and signs involving cognitive functions and awareness: Secondary | ICD-10-CM | POA: Diagnosis not present

## 2016-05-13 DIAGNOSIS — R2681 Unsteadiness on feet: Secondary | ICD-10-CM | POA: Diagnosis not present

## 2016-05-16 DIAGNOSIS — R4189 Other symptoms and signs involving cognitive functions and awareness: Secondary | ICD-10-CM | POA: Diagnosis not present

## 2016-05-16 DIAGNOSIS — R278 Other lack of coordination: Secondary | ICD-10-CM | POA: Diagnosis not present

## 2016-05-16 DIAGNOSIS — R2681 Unsteadiness on feet: Secondary | ICD-10-CM | POA: Diagnosis not present

## 2016-05-18 DIAGNOSIS — R2681 Unsteadiness on feet: Secondary | ICD-10-CM | POA: Diagnosis not present

## 2016-05-18 DIAGNOSIS — R4189 Other symptoms and signs involving cognitive functions and awareness: Secondary | ICD-10-CM | POA: Diagnosis not present

## 2016-05-18 DIAGNOSIS — R278 Other lack of coordination: Secondary | ICD-10-CM | POA: Diagnosis not present

## 2016-05-20 DIAGNOSIS — R278 Other lack of coordination: Secondary | ICD-10-CM | POA: Diagnosis not present

## 2016-05-23 DIAGNOSIS — R278 Other lack of coordination: Secondary | ICD-10-CM | POA: Diagnosis not present

## 2016-05-25 DIAGNOSIS — R278 Other lack of coordination: Secondary | ICD-10-CM | POA: Diagnosis not present

## 2016-05-27 DIAGNOSIS — R278 Other lack of coordination: Secondary | ICD-10-CM | POA: Diagnosis not present

## 2016-05-30 DIAGNOSIS — R278 Other lack of coordination: Secondary | ICD-10-CM | POA: Diagnosis not present

## 2016-06-03 DIAGNOSIS — R278 Other lack of coordination: Secondary | ICD-10-CM | POA: Diagnosis not present

## 2016-06-06 DIAGNOSIS — R278 Other lack of coordination: Secondary | ICD-10-CM | POA: Diagnosis not present

## 2016-06-08 DIAGNOSIS — R278 Other lack of coordination: Secondary | ICD-10-CM | POA: Diagnosis not present

## 2016-06-09 ENCOUNTER — Encounter: Payer: Self-pay | Admitting: Urology

## 2016-06-09 ENCOUNTER — Ambulatory Visit: Payer: Medicare Other | Admitting: Urology

## 2016-06-09 NOTE — Progress Notes (Deleted)
    1:31 PM   Kaitlyn Ramos [birth year] [id number]  Referring provider: Maryland Pink, MD [address] [facility] [place], [place] [address]  No chief complaint on file.   HPI: Kaitlyn Ramos  is an 83 year old Caucasian female who suffers with urge incontinence who presents today for PTNS treatment. This is a maintenance treatment.    Patient has been suffering with urge incontinence for last few years. She does not recall a specific event that set off the incontinence.  She has been experiencing a decrease in her incontinence and urgency since starting the PTNS.  After her last treatment,  she is currently experiencing 0 (stable) leakage episode a day. She is experiencing a daytime frequency of 4 (stable) trips to the restroom daily.  She admits to nocturia 1 (improved). She does not have pain or burning with urination. She has a mild urge to urinate.  She is still wearing depends at night for security.  She does not consume alcohol and she drinks 2 caffeinated beverages daily.  Today, she is experiencing urgency and nocturia.  She denies dysuria, gross hematuria and suprapubic pain.  She has not had recent fevers, chills, nausea or vomiting.    She has tried fluid restrictions and Kegel exercises without improvement of her urinary symptoms when she presented to [place] in February, she had a trial of Myrbetriq which she found ineffective. She has restarted the Vesicare.    She does not have any contraindications presents for PTNS, such as: Pacemaker, implantable defibrillator, history of abnormal bleeding, history of neuropathy or nerve damage or pregnancy.    Her goals for therapy are reduced urgency and no accidents.  ROS     Physical Exam There were no vitals taken for this visit.  Constitutional: Well nourished. Alert and oriented, No acute distress. HEENT: [place] AT, moist mucus membranes. Trachea midline, no masses. Cardiovascular: No clubbing, cyanosis, or  edema. Respiratory: Normal respiratory effort, no increased work of breathing. Skin: No rashes, bruises or suspicious lesions. Lymph: No cervical or inguinal adenopathy. Neurologic: Grossly intact, no focal deficits, moving all 4 extremities. Psychiatric: Normal mood and affect.       Assessment & Plan:    1. Urge incontinence-  Patient feels that she is responding well to the PTNS treatment.  She will RTC in 6 weeks for her maintenance PTNS.     PTNS treatment: The patient's right ankle is cleansed with rubbing alcohol. The needle electrode was inserted into the lower, inner aspect of patient's right leg.  A surface electrode was placed on the inside arch of the foot on the treatment leg. The lead set was connected to the stimulator and the needle electrode clip was connected to the needle electrode. The stimulator that produces an adjustable electrical pulse that travels to sacral nerve plexus via the tibial and nerve was increased to 6 and patient received a sensory response and a toe flex to the stimulus. She tolerated the procedure well for 30 minutes.      RTC in 6 weeks for PTNS  Zara Council, [facility] [address] [place],  [address] [phone]

## 2016-06-14 DIAGNOSIS — R278 Other lack of coordination: Secondary | ICD-10-CM | POA: Diagnosis not present

## 2016-06-16 DIAGNOSIS — Z9181 History of falling: Secondary | ICD-10-CM | POA: Diagnosis not present

## 2016-06-16 DIAGNOSIS — R278 Other lack of coordination: Secondary | ICD-10-CM | POA: Diagnosis not present

## 2016-06-17 DIAGNOSIS — Z9181 History of falling: Secondary | ICD-10-CM | POA: Diagnosis not present

## 2016-06-17 DIAGNOSIS — R278 Other lack of coordination: Secondary | ICD-10-CM | POA: Diagnosis not present

## 2016-06-23 ENCOUNTER — Ambulatory Visit (INDEPENDENT_AMBULATORY_CARE_PROVIDER_SITE_OTHER): Payer: Medicare Other | Admitting: Urology

## 2016-06-23 ENCOUNTER — Encounter: Payer: Self-pay | Admitting: Urology

## 2016-06-23 VITALS — BP 196/97 | HR 57 | Ht 60.0 in | Wt 154.0 lb

## 2016-06-23 DIAGNOSIS — N3941 Urge incontinence: Secondary | ICD-10-CM

## 2016-06-23 DIAGNOSIS — E782 Mixed hyperlipidemia: Secondary | ICD-10-CM | POA: Diagnosis not present

## 2016-06-23 DIAGNOSIS — I10 Essential (primary) hypertension: Secondary | ICD-10-CM | POA: Diagnosis not present

## 2016-06-23 DIAGNOSIS — G4733 Obstructive sleep apnea (adult) (pediatric): Secondary | ICD-10-CM | POA: Diagnosis not present

## 2016-06-23 NOTE — Progress Notes (Signed)
2:48 PM   Kaitlyn Ramos [birth year] [id number]  Referring provider: Maryland Pink, MD [address] [facility] [place], [place] [address]  Chief Complaint  Patient presents with  . Urinary Incontinence    HPI: Kaitlyn Ramos  is an 83 year old Caucasian female who suffers with urge incontinence who presents today for PTNS treatment. This is a maintenance treatment.    Patient has been suffering with urge incontinence for last few years. She does not recall a specific event that set off the incontinence.  She has been experiencing a decrease in her incontinence and urgency since starting the PTNS.  After her last treatment,  she is currently experiencing 1 (worse) leakage episode a day. She is experiencing a daytime frequency of 3 (improved) trips to the restroom daily.  She admits to nocturia 1 (stable). She does not have pain or burning with urination. She has a mild urge to urinate.  She is still wearing depends at night for security.  She does not consume alcohol and she drinks 2 caffeinated beverages daily.  Today, she is experiencing urgency and incontinence.  She denies dysuria, gross hematuria and suprapubic pain.  She has not had recent fevers, chills, nausea or vomiting.    She has tried fluid restrictions and Kegel exercises without improvement of her urinary symptoms when she presented to [place] in February, she had a trial of Myrbetriq which she found ineffective. She has restarted the Vesicare.    She does not have any contraindications presents for PTNS, such as: Pacemaker, implantable defibrillator, history of abnormal bleeding, history of neuropathy or nerve damage or pregnancy.    Her goals for therapy are reduced urgency and no accidents.  ROS  UROLOGY Frequent Urination?: No Hard to postpone urination?: Yes Burning/pain with urination?: No Get up at night to urinate?: No Leakage of urine?: Yes Urine stream starts and stops?: No Trouble starting  stream?: No Do you have to strain to urinate?: No Blood in urine?: No Urinary tract infection?: No Sexually transmitted disease?: No Injury to kidneys or bladder?: No Painful intercourse?: No Weak stream?: No Currently pregnant?: No Vaginal bleeding?: No Last menstrual period?: n Gastrointestinal Nausea?: No Vomiting?: No Indigestion/heartburn?: No Diarrhea?: No Constipation?: No Constitutional Fever: No Night sweats?: No Weight loss?: No Fatigue?: Yes Skin Skin rash/lesions?: No Itching?: No Eyes Blurred vision?: No Double vision?: No Ears/Nose/Throat Sore throat?: No Sinus problems?: No Hematologic/Lymphatic Swollen glands?: No Easy bruising?: No Cardiovascular Leg swelling?: No Chest pain?: No Respiratory Cough?: No Shortness of breath?: No Endocrine Excessive thirst?: No Musculoskeletal Back pain?: No Joint pain?: No Neurological Headaches?: No Dizziness?: Yes Psychologic Depression?: No Anxiety?: No  Physical Exam Blood pressure (!) 196/97, pulse (!) 57, height 5' (1.524 m), weight 154 lb (69.9 kg).  Constitutional: Well nourished. Alert and oriented, No acute distress. HEENT: [place] AT, moist mucus membranes. Trachea midline, no masses. Cardiovascular: No clubbing, cyanosis, or edema. Respiratory: Normal respiratory effort, no increased work of breathing. Skin: No rashes, bruises or suspicious lesions. Lymph: No cervical or inguinal adenopathy. Neurologic: Grossly intact, no focal deficits, moving all 4 extremities. Psychiatric: Normal mood and affect.       Assessment & Plan:    1. Urge incontinence-  Patient feels that she is responding well to the PTNS treatment.  She will RTC in 6 weeks for her maintenance PTNS.     PTNS treatment: The patient's right ankle is cleansed with rubbing alcohol. The needle electrode was inserted into the lower,  inner aspect of patient's right leg.  A surface electrode was placed on the inside arch of the foot  on the treatment leg. The lead set was connected to the stimulator and the needle electrode clip was connected to the needle electrode. The stimulator that produces an adjustable electrical pulse that travels to sacral nerve plexus via the tibial and nerve was increased to 5 and patient received a sensory response and a toe flex to the stimulus. She tolerated the procedure well for 30 minutes.      RTC in 6 weeks for PTNS  Zara Council, [facility] [address], [place], [place] [address] [phone]

## 2016-06-23 NOTE — Progress Notes (Signed)
PTNS  Session # 13  Health & Social Factors: no change Caffeine: 0 Alcohol: 0 Daytime voids #per day: 3 Night-time voids #per night: 1 Urgency: strong Incontinence Episodes #per day: 1 Ankle used: right Treatment Setting: 5 Feeling/ Response: toe flex Comments: Patient tolerated well  Preformed By: Zara Council, PA-C  Assistant: Neomia Dear, CMA  Follow Up: 1 month

## 2016-07-18 DIAGNOSIS — I10 Essential (primary) hypertension: Secondary | ICD-10-CM | POA: Diagnosis not present

## 2016-07-18 DIAGNOSIS — R42 Dizziness and giddiness: Secondary | ICD-10-CM | POA: Diagnosis not present

## 2016-07-18 DIAGNOSIS — H539 Unspecified visual disturbance: Secondary | ICD-10-CM | POA: Diagnosis not present

## 2016-08-04 ENCOUNTER — Ambulatory Visit (INDEPENDENT_AMBULATORY_CARE_PROVIDER_SITE_OTHER): Payer: Medicare Other | Admitting: Urology

## 2016-08-04 ENCOUNTER — Encounter: Payer: Self-pay | Admitting: Urology

## 2016-08-04 VITALS — BP 193/110 | HR 58 | Ht 62.0 in | Wt 153.6 lb

## 2016-08-04 DIAGNOSIS — N3941 Urge incontinence: Secondary | ICD-10-CM

## 2016-08-04 LAB — PTNS-PERCUTANEOUS TIBIAL NERVE STIMULATION: Scan Result: 5

## 2016-08-04 NOTE — Progress Notes (Signed)
PTNS  Session # Maint # 14 Health & Social Factors: No Change Caffeine: 0 Alcohol: 0 Daytime voids #per day: 3 Night-time voids #per night: 1 Urgency: Strong Incontinence Episodes #per day: 1 Ankle used: Right Treatment Setting: 5 Feeling/ Response: Both  Preformed By: Zara Council PA-C  Assistant: Lyndee Hensen CMA  Follow Up: One Month

## 2016-08-04 NOTE — Progress Notes (Signed)
2:27 PM   Kaitlyn Ramos 07-Jan-1928 [id number]  Referring provider: Maryland Pink, MD [address] [facility] [place], [place] [address]  Chief Complaint  Patient presents with  . PTNS    urge incontinence    HPI: Kaitlyn Ramos  is an 83 year old Caucasian female who suffers with urge incontinence who presents today for PTNS treatment. This is a maintenance treatment.    Patient has been suffering with urge incontinence for last few years. She does not recall a specific event that set off the incontinence.  She has been experiencing a decrease in her incontinence and urgency since starting the PTNS.  After her last treatment,  she is currently experiencing 1 (stable) leakage episode a day. She is experiencing a daytime frequency of 3 (stable) trips to the restroom daily.  She admits to nocturia 1 (stable). She does not have pain or burning with urination. She has a mild urge to urinate.  She is still wearing depends at night for security.  She does not consume alcohol and she drinks 2 caffeinated beverages daily.  Today, she is experiencing urgency and incontinence.  She denies dysuria, gross hematuria and suprapubic pain.  She has not had recent fevers, chills, nausea or vomiting.    She has tried fluid restrictions and Kegel exercises without improvement of her urinary symptoms when she presented to [place] in February, she had a trial of Myrbetriq which she found ineffective. She has restarted the Vesicare.    She does not have any contraindications presents for PTNS, such as: Pacemaker, implantable defibrillator, history of abnormal bleeding, history of neuropathy or nerve damage or pregnancy.    Her goals for therapy are reduced urgency and no accidents.  ROS  UROLOGY Frequent Urination?: No Hard to postpone urination?: Yes Burning/pain with urination?: No Get up at night to urinate?: No Leakage of urine?: Yes Urine stream starts and stops?: No Trouble starting  stream?: No Do you have to strain to urinate?: No Blood in urine?: No Urinary tract infection?: No Sexually transmitted disease?: No Injury to kidneys or bladder?: No Painful intercourse?: No Weak stream?: No Currently pregnant?: No Vaginal bleeding?: No Last menstrual period?: n Gastrointestinal Nausea?: No Vomiting?: No Indigestion/heartburn?: No Diarrhea?: No Constipation?: No Constitutional Fever: No Night sweats?: No Weight loss?: No Fatigue?: No Skin Skin rash/lesions?: No Itching?: No Eyes Blurred vision?: No Double vision?: No Ears/Nose/Throat Sore throat?: No Sinus problems?: No Hematologic/Lymphatic Swollen glands?: No Easy bruising?: No Cardiovascular Leg swelling?: No Chest pain?: No Respiratory Cough?: No Shortness of breath?: No Endocrine Excessive thirst?: No Musculoskeletal Back pain?: No Joint pain?: No Neurological Headaches?: No Dizziness?: No Psychologic Depression?: No Anxiety?: No  Physical Exam Blood pressure (!) 193/110, pulse (!) 58, height 5\' 2"  (1.575 m), weight 153 lb 9.6 oz (69.7 kg).  Constitutional: Well nourished. Alert and oriented, No acute distress. HEENT: [place] AT, moist mucus membranes. Trachea midline, no masses. Cardiovascular: No clubbing, cyanosis, or edema. Respiratory: Normal respiratory effort, no increased work of breathing. Skin: No rashes, bruises or suspicious lesions. Lymph: No cervical or inguinal adenopathy. Neurologic: Grossly intact, no focal deficits, moving all 4 extremities. Psychiatric: Normal mood and affect.       Assessment & Plan:    1. Urge incontinence-  Patient feels that she is responding well to the PTNS treatment.  She will RTC in 6 weeks for her maintenance PTNS.     PTNS treatment: The patient's right ankle is cleansed with rubbing alcohol. The  needle electrode was inserted into the lower, inner aspect of patient's right leg.  A surface electrode was placed on the inside arch of  the foot on the treatment leg. The lead set was connected to the stimulator and the needle electrode clip was connected to the needle electrode. The stimulator that produces an adjustable electrical pulse that travels to sacral nerve plexus via the tibial and nerve was increased to 5 and patient received a sensory response and a toe flex to the stimulus. She tolerated the procedure well for 30 minutes.      RTC in 6 weeks for PTNS  Zara Council, [facility] [address] [place], [place] [address] [phone]

## 2016-08-18 DIAGNOSIS — D2261 Melanocytic nevi of right upper limb, including shoulder: Secondary | ICD-10-CM | POA: Diagnosis not present

## 2016-08-18 DIAGNOSIS — D225 Melanocytic nevi of trunk: Secondary | ICD-10-CM | POA: Diagnosis not present

## 2016-08-18 DIAGNOSIS — D2272 Melanocytic nevi of left lower limb, including hip: Secondary | ICD-10-CM | POA: Diagnosis not present

## 2016-08-18 DIAGNOSIS — D2262 Melanocytic nevi of left upper limb, including shoulder: Secondary | ICD-10-CM | POA: Diagnosis not present

## 2016-09-14 NOTE — Progress Notes (Deleted)
    8:09 AM   Kaitlyn Ramos 04-02-1928 [id number]  Referring provider: Maryland Pink, MD [address] [facility] [place], [place] [address]  No chief complaint on file.   HPI: Kaitlyn Ramos  is an 83 year old Caucasian female who suffers with urge incontinence who presents today for PTNS treatment. This is a maintenance treatment.    Patient has been suffering with urge incontinence for last few years. She does not recall a specific event that set off the incontinence.  She has been experiencing a decrease in her incontinence and urgency since starting the PTNS.  After her last treatment,  she is currently experiencing 1 (stable) leakage episode a day. She is experiencing a daytime frequency of 3 (stable) trips to the restroom daily.  She admits to nocturia 1 (stable). She does not have pain or burning with urination. She has a mild urge to urinate.  She is still wearing depends at night for security.  She does not consume alcohol and she drinks 2 caffeinated beverages daily.  Today, she is experiencing urgency and incontinence.  She denies dysuria, gross hematuria and suprapubic pain.  She has not had recent fevers, chills, nausea or vomiting.    She has tried fluid restrictions and Kegel exercises without improvement of her urinary symptoms when she presented to [place] in February, she had a trial of Myrbetriq which she found ineffective. She has restarted the Vesicare.    She does not have any contraindications presents for PTNS, such as: Pacemaker, implantable defibrillator, history of abnormal bleeding, history of neuropathy or nerve damage or pregnancy.    Her goals for therapy are reduced urgency and no accidents.  ROS     Physical Exam There were no vitals taken for this visit.  Constitutional: Well nourished. Alert and oriented, No acute distress. HEENT: [place] AT, moist mucus membranes. Trachea midline, no masses. Cardiovascular: No clubbing, cyanosis, or  edema. Respiratory: Normal respiratory effort, no increased work of breathing. Skin: No rashes, bruises or suspicious lesions. Lymph: No cervical or inguinal adenopathy. Neurologic: Grossly intact, no focal deficits, moving all 4 extremities. Psychiatric: Normal mood and affect.       Assessment & Plan:    1. Urge incontinence-  Patient feels that she is responding well to the PTNS treatment.  She will RTC in 6 weeks for her maintenance PTNS.     PTNS treatment: The patient's right ankle is cleansed with rubbing alcohol. The needle electrode was inserted into the lower, inner aspect of patient's right leg.  A surface electrode was placed on the inside arch of the foot on the treatment leg. The lead set was connected to the stimulator and the needle electrode clip was connected to the needle electrode. The stimulator that produces an adjustable electrical pulse that travels to sacral nerve plexus via the tibial and nerve was increased to 5 and patient received a sensory response and a toe flex to the stimulus. She tolerated the procedure well for 30 minutes.      RTC in 6 weeks for PTNS  Zara Council, [facility] [address], [place], [place] [address] [phone]

## 2016-09-15 ENCOUNTER — Ambulatory Visit: Payer: Medicare Other | Admitting: Urology

## 2016-09-20 NOTE — Progress Notes (Signed)
3:34 PM   Kaitlyn Ramos 04-24-1927 [id number]  Referring provider: Maryland Pink, MD [address] [facility] [place], [place] [address]  Chief Complaint  Patient presents with  . PTNS    urge incontinence    HPI: Kaitlyn Ramos  is an 83 year old Caucasian female who suffers with urge incontinence who presents today for PTNS treatment. This is a maintenance treatment.    Patient has been suffering with urge incontinence for last few years. She does not recall a specific event that set off the incontinence.  She has been experiencing a decrease in her incontinence and urgency since starting the PTNS.  After her last treatment,  she is currently experiencing 1 (stable) leakage episode a day. She is experiencing a daytime frequency of 3 (stable) trips to the restroom daily.  She admits to nocturia 1 (stable). She does not have pain or burning with urination. She has a strong urge to urinate.  She is still wearing depends at night for security.  She does not consume alcohol and she drinks 2 caffeinated beverages daily.  Today, she is experiencing urgency and incontinence.  She denies dysuria, gross hematuria and suprapubic pain.  She has not had recent fevers, chills, nausea or vomiting.    She has tried fluid restrictions and Kegel exercises without improvement of her urinary symptoms when she presented to [place] in February, she had a trial of Myrbetriq which she found ineffective. She has restarted the Vesicare.    She does not have any contraindications presents for PTNS, such as: Pacemaker, implantable defibrillator, history of abnormal bleeding, history of neuropathy or nerve damage or pregnancy.    Her goals for therapy are reduced urgency and no accidents.  ROS  UROLOGY Frequent Urination?: No Hard to postpone urination?: Yes Burning/pain with urination?: No Get up at night to urinate?: No Leakage of urine?: Yes Urine stream starts and stops?: No Trouble  starting stream?: No Do you have to strain to urinate?: No Blood in urine?: No Urinary tract infection?: No Sexually transmitted disease?: No Injury to kidneys or bladder?: No Painful intercourse?: No Weak stream?: No Currently pregnant?: No Vaginal bleeding?: No Last menstrual period?: n Gastrointestinal Nausea?: No Vomiting?: No Indigestion/heartburn?: No Diarrhea?: No Constipation?: No Constitutional Fever: No Night sweats?: No Weight loss?: No Fatigue?: No Skin Skin rash/lesions?: No Itching?: No Eyes Blurred vision?: No Double vision?: No Ears/Nose/Throat Sore throat?: No Sinus problems?: No Hematologic/Lymphatic Swollen glands?: No Easy bruising?: No Cardiovascular Leg swelling?: No Chest pain?: No Respiratory Cough?: No Shortness of breath?: No Endocrine Excessive thirst?: No Musculoskeletal Back pain?: No Joint pain?: No Neurological Headaches?: No Dizziness?: No Psychologic Depression?: No Anxiety?: No  Physical Exam Blood pressure (!) 171/110, pulse 79, height 5\' 2"  (1.575 m), weight 154 lb 4.8 oz (70 kg).  Constitutional: Well nourished. Alert and oriented, No acute distress. HEENT: [place] AT, moist mucus membranes. Trachea midline, no masses. Cardiovascular: No clubbing, cyanosis, or edema. Respiratory: Normal respiratory effort, no increased work of breathing. Skin: No rashes, bruises or suspicious lesions. Lymph: No cervical or inguinal adenopathy. Neurologic: Grossly intact, no focal deficits, moving all 4 extremities. Psychiatric: Normal mood and affect.       Assessment & Plan:    1. Urge incontinence-  Patient feels that she is responding well to the PTNS treatment.  She will RTC in 6 weeks for her maintenance PTNS.     PTNS treatment: The patient's right ankle is cleansed with rubbing alcohol. The needle  electrode was inserted into the lower, inner aspect of patient's left leg.  A surface electrode was placed on the inside arch  of the foot on the treatment leg. The lead set was connected to the stimulator and the needle electrode clip was connected to the needle electrode. The stimulator that produces an adjustable electrical pulse that travels to sacral nerve plexus via the tibial and nerve was increased to 7 and patient received a sensory response.  She tolerated the procedure well for 30 minutes.      RTC in 6 weeks for PTNS  Kaitlyn Ramos, [facility] [address] [place], [place] [address] [phone]

## 2016-09-22 ENCOUNTER — Ambulatory Visit (INDEPENDENT_AMBULATORY_CARE_PROVIDER_SITE_OTHER): Payer: Medicare Other | Admitting: Urology

## 2016-09-22 ENCOUNTER — Encounter: Payer: Self-pay | Admitting: Urology

## 2016-09-22 VITALS — BP 171/110 | HR 79 | Ht 62.0 in | Wt 154.3 lb

## 2016-09-22 DIAGNOSIS — N3941 Urge incontinence: Secondary | ICD-10-CM | POA: Diagnosis not present

## 2016-09-22 LAB — PTNS-PERCUTANEOUS TIBIAL NERVE STIMULATION: Scan Result: 7

## 2016-09-22 NOTE — Progress Notes (Signed)
PTNS  Session # Maint # 15  Health & Social Factors: No Change Caffeine: 0 Alcohol: 0 Daytime voids #per day: 2-3 Night-time voids #per night: 1 Urgency: Strong Incontinence Episodes #per day: 1 Ankle used: Left Treatment Setting: 7 Feeling/ Response: Sensory  Preformed By: Zara Council PA-C  Follow Up: One Month

## 2016-10-25 DIAGNOSIS — I10 Essential (primary) hypertension: Secondary | ICD-10-CM | POA: Diagnosis not present

## 2016-11-02 NOTE — Progress Notes (Signed)
4:46 PM   DAVIE SAGONA 1927-11-21 [id number]  Referring provider: Maryland Pink, MD [address] [facility] [place], [place] [address]  Chief Complaint  Patient presents with  . PTNS    Urge incontinence    HPI: Kaitlyn Ramos  is an 83 year old Caucasian female who suffers with urge incontinence who presents today for PTNS treatment. This is a maintenance treatment.    Patient has been suffering with urge incontinence for last few years. She does not recall a specific event that set off the incontinence.  She has been experiencing a decrease in her incontinence and urgency since starting the PTNS.  After her last treatment,  she is currently experiencing 1 (stable) leakage episode a day. She is experiencing a daytime frequency of 3 (stable) trips to the restroom daily.  She admits to nocturia 1 (stable). She does not have pain or burning with urination. She has a strong urge to urinate.  She is still wearing depends at night for security.  She does not consume alcohol and she drinks 2 caffeinated beverages daily.  She has tried fluid restrictions and Kegel exercises without improvement of her urinary symptoms when she presented to [place] in February, she had a trial of Myrbetriq which she found ineffective. She has restarted the Vesicare.    She does not have any contraindications presents for PTNS, such as: Pacemaker, implantable defibrillator, history of abnormal bleeding, history of neuropathy or nerve damage or pregnancy.    Her goals for therapy are reduced urgency and no accidents.  ROS  UROLOGY Frequent Urination?: No Hard to postpone urination?: Yes Burning/pain with urination?: No Get up at night to urinate?: Yes Leakage of urine?: Yes Urine stream starts and stops?: No Trouble starting stream?: No Do you have to strain to urinate?: No Blood in urine?: No Urinary tract infection?: No Sexually transmitted disease?: No Injury to kidneys or bladder?:  No Painful intercourse?: No Weak stream?: No Currently pregnant?: No Vaginal bleeding?: No Last menstrual period?: n Gastrointestinal Nausea?: No Vomiting?: No Indigestion/heartburn?: No Diarrhea?: No Constipation?: No Constitutional Fever: No Night sweats?: No Weight loss?: No Fatigue?: Yes Skin Skin rash/lesions?: No Itching?: No Eyes Blurred vision?: No Double vision?: No Ears/Nose/Throat Sore throat?: No Sinus problems?: No Hematologic/Lymphatic Swollen glands?: No Easy bruising?: No Cardiovascular Leg swelling?: No Chest pain?: No Respiratory Cough?: No Shortness of breath?: No Endocrine Excessive thirst?: No Musculoskeletal Back pain?: No Joint pain?: No Neurological Headaches?: No Dizziness?: No Psychologic Depression?: No Anxiety?: No  Physical Exam Blood pressure (!) 164/81, pulse (!) 58, height 5\' 2"  (1.575 m), weight 151 lb 11.2 oz (68.8 kg).  Constitutional: Well nourished. Alert and oriented, No acute distress. HEENT: [place] AT, moist mucus membranes. Trachea midline, no masses. Cardiovascular: No clubbing, cyanosis, or edema. Respiratory: Normal respiratory effort, no increased work of breathing. Skin: No rashes, bruises or suspicious lesions. Lymph: No cervical or inguinal adenopathy. Neurologic: Grossly intact, no focal deficits, moving all 4 extremities. Psychiatric: Normal mood and affect.       Assessment & Plan:    1. Urge incontinence-  Patient feels that she is responding well to the PTNS treatment.  She will RTC in 6 weeks for her maintenance PTNS.     PTNS treatment: The patient's right ankle is cleansed with rubbing alcohol. The needle electrode was inserted into the lower, inner aspect of patient's left leg.  A surface electrode was placed on the inside arch of the foot on the treatment leg.  The lead set was connected to the stimulator and the needle electrode clip was connected to the needle electrode. The stimulator that  produces an adjustable electrical pulse that travels to sacral nerve plexus via the tibial and nerve was increased to 3 and patient received a sensory response.  She tolerated the procedure well for 30 minutes.      RTC in 6 weeks for PTNS  Zara Council, [facility] [address] [place], [place] [address] [phone]

## 2016-11-03 ENCOUNTER — Ambulatory Visit (INDEPENDENT_AMBULATORY_CARE_PROVIDER_SITE_OTHER): Payer: Medicare Other | Admitting: Urology

## 2016-11-03 ENCOUNTER — Encounter: Payer: Self-pay | Admitting: Urology

## 2016-11-03 VITALS — BP 164/81 | HR 58 | Ht 62.0 in | Wt 151.7 lb

## 2016-11-03 DIAGNOSIS — N3941 Urge incontinence: Secondary | ICD-10-CM

## 2016-11-03 NOTE — Progress Notes (Signed)
PTNS  Session # Maint 16  Health & Social Factors: No Change  Caffeine: 0 Alcohol: 0 Daytime voids #per day: 2-3 Night-time voids #per night: 1 Urgency: Strong Incontinence Episodes #per day: 1 Ankle used: Left Treatment Setting: 3 Feeling/ Response: Sensory  Preformed By: Zara Council PA-C and PA Student Lucy  Follow Up: One Month

## 2016-11-11 ENCOUNTER — Emergency Department
Admission: EM | Admit: 2016-11-11 | Discharge: 2016-11-11 | Disposition: A | Discharge status: Skilled Nursing Facility | Payer: Medicare Other | Attending: Emergency Medicine | Admitting: Emergency Medicine

## 2016-11-11 ENCOUNTER — Emergency Department
Admission: EM | Admit: 2016-11-11 | Discharge: 2016-11-11 | Disposition: A | Discharge status: Home / Self Care | Payer: Self-pay | Attending: Emergency Medicine | Admitting: Emergency Medicine

## 2016-11-11 ENCOUNTER — Encounter: Payer: Self-pay | Admitting: Emergency Medicine

## 2016-11-11 DIAGNOSIS — Z79899 Other long term (current) drug therapy: Secondary | ICD-10-CM | POA: Insufficient documentation

## 2016-11-11 DIAGNOSIS — R112 Nausea with vomiting, unspecified: Secondary | ICD-10-CM

## 2016-11-11 DIAGNOSIS — Z87891 Personal history of nicotine dependence: Secondary | ICD-10-CM | POA: Diagnosis not present

## 2016-11-11 DIAGNOSIS — I10 Essential (primary) hypertension: Secondary | ICD-10-CM | POA: Insufficient documentation

## 2016-11-11 DIAGNOSIS — Z5321 Procedure and treatment not carried out due to patient leaving prior to being seen by health care provider: Secondary | ICD-10-CM | POA: Insufficient documentation

## 2016-11-11 DIAGNOSIS — R110 Nausea: Secondary | ICD-10-CM | POA: Diagnosis not present

## 2016-11-11 DIAGNOSIS — E039 Hypothyroidism, unspecified: Secondary | ICD-10-CM | POA: Diagnosis not present

## 2016-11-11 LAB — COMPREHENSIVE METABOLIC PANEL
ALT: 13 U/L — ABNORMAL LOW (ref 14–54)
AST: 21 U/L (ref 15–41)
Albumin: 4.1 g/dL (ref 3.5–5.0)
Alkaline Phosphatase: 51 U/L (ref 38–126)
Anion gap: 9 (ref 5–15)
BUN: 16 mg/dL (ref 6–20)
CHLORIDE: 105 mmol/L (ref 101–111)
CO2: 26 mmol/L (ref 22–32)
Calcium: 9.2 mg/dL (ref 8.9–10.3)
Creatinine, Ser: 1 mg/dL (ref 0.44–1.00)
GFR, EST AFRICAN AMERICAN: 57 mL/min — AB (ref >60)
GFR, EST NON AFRICAN AMERICAN: 50 mL/min — AB (ref >60)
Glucose, Bld: 99 mg/dL (ref 65–99)
Potassium: 4.4 mmol/L (ref 3.5–5.1)
SODIUM: 140 mmol/L (ref 135–145)
Total Bilirubin: 0.7 mg/dL (ref 0.3–1.2)
Total Protein: 7.1 g/dL (ref 6.5–8.1)

## 2016-11-11 LAB — LIPASE, BLOOD: LIPASE: 24 U/L (ref 11–51)

## 2016-11-11 LAB — URINALYSIS, ROUTINE W REFLEX MICROSCOPIC
Bilirubin Urine: NEGATIVE
GLUCOSE, UA: NEGATIVE mg/dL
HGB URINE DIPSTICK: NEGATIVE
Ketones, ur: NEGATIVE mg/dL
Leukocytes, UA: NEGATIVE
Nitrite: NEGATIVE
Protein, ur: NEGATIVE mg/dL
SPECIFIC GRAVITY, URINE: 1.005 (ref 1.005–1.030)
pH: 7 (ref 5.0–8.0)

## 2016-11-11 LAB — CBC
HCT: 40.2 % (ref 35.0–47.0)
HEMOGLOBIN: 13.6 g/dL (ref 12.0–16.0)
MCH: 29.8 pg (ref 26.0–34.0)
MCHC: 33.9 g/dL (ref 32.0–36.0)
MCV: 87.8 fL (ref 80.0–100.0)
PLATELETS: 149 10*3/uL — AB (ref 150–440)
RBC: 4.58 MIL/uL (ref 3.80–5.20)
RDW: 13.9 % (ref 11.5–14.5)
WBC: 8.9 10*3/uL (ref 3.6–11.0)

## 2016-11-11 LAB — TROPONIN I

## 2016-11-11 MED ORDER — SODIUM CHLORIDE 0.9 % IV BOLUS (SEPSIS)
1000.0000 mL | Freq: Once | INTRAVENOUS | Status: AC
  Administered 2016-11-11: 1000 mL via INTRAVENOUS

## 2016-11-11 MED ORDER — ONDANSETRON HCL 4 MG/2ML IJ SOLN
4.0000 mg | Freq: Once | INTRAMUSCULAR | Status: AC
  Administered 2016-11-11: 4 mg via INTRAVENOUS
  Filled 2016-11-11: qty 2

## 2016-11-11 MED ORDER — ONDANSETRON 4 MG PO TBDP: 4.0000 mg | ORAL_TABLET | Freq: Three times a day (TID) | ORAL | 0 refills | Status: DC | PRN

## 2016-11-11 NOTE — ED Notes (Signed)
MD aware of BP

## 2016-11-11 NOTE — ED Triage Notes (Signed)
Patient presents to ED via [facility] from [facility] for hypertension. 206/111. Patient denies any dizziness, blurred vision, CP or SOB.

## 2016-11-11 NOTE — ED Triage Notes (Signed)
Patient presents to ED via [facility] from [facility] for HTN. 206/111. Patient denies any CP, SOB, blurred vision or dizziness.

## 2016-11-11 NOTE — ED Provider Notes (Signed)
[facility] Emergency Department Provider Note  Time seen: 12:44 PM  I have reviewed the triage vital signs and the nursing notes.   HISTORY  Chief Complaint Hypertension    HPI Kaitlyn Ramos is a 83 y.o. female with a past medical historyof hypertension hyperlipidemia, presents to the emergency department for vomiting and elevated blood pressure. According to the patient for the past several months her blood pressure has been staying around 200/100. She has been following up with her doctor who is monitoring this. She states today she began feeling nauseated and had several episodes of vomiting which is what led to her emergency department visit today. Upon arrival the patient has a significant elevated blood pressure around 200/120. Denies any headache, chest pain or trouble breathing. States her nausea has diminished considerably denies any abdominal pain. States a normal bowel movement today without diarrhea.  Past Medical History:  Diagnosis Date  . Cervicalgia   . Depression   . Essential hypertension   . Hemorrhoids   . History of MRSA infection   . Hyperlipidemia   . Hypertension   . Hypothyroidism   . IBS (irritable bowel syndrome)   . Osteoarthritis of multiple joints   . Restless leg   . Senile osteoporosis   . Shingles   . Sleep apnea   . Ulcerative colitis ([place])   . Urge incontinence     Patient Active Problem List   Diagnosis Date Noted  . Syncope 03/03/2016  . Pain in shoulder 11/25/2014  . Impingement syndrome of shoulder 11/25/2014  . Bilirubin in urine 10/04/2014  . Recurrent UTI 09/28/2014  . Urge incontinence 09/28/2014  . Microscopic hematuria 09/28/2014  . Cervical pain 09/28/2014  . Degenerative joint disease involving multiple joints 09/28/2014  . Involutional osteoporosis 09/28/2014  . Adult hypothyroidism 09/28/2014  . Acute low back pain 08/26/2014  . Arthralgia of hip 08/26/2014  . Combined fat and carbohydrate  induced hyperlipemia 01/23/2014  . BP (high blood pressure) 01/23/2014  . Apnea, sleep 01/23/2014    Past Surgical History:  Procedure Laterality Date  . ABDOMINAL HYSTERECTOMY  1980  . cataract surgery    . CHOLECYSTECTOMY  1955  . colon polyp removal  12/2012  . ESOPHAGOGASTRODUODENOSCOPY  2014  . makoplasty Left 06/2012  . parotid gland removal  1984    Prior to Admission medications   Medication Sig Start Date End Date Taking? Authorizing Provider  amLODipine (NORVASC) 10 MG tablet Take 1 tablet (10 mg total) by mouth daily. Patient not taking: Reported on 09/22/2016 03/06/16   Epifanio Lesches, MD  Calcium Carbonate-Vitamin D (CALCIUM 500 + D) 500-125 MG-UNIT TABS Take 1 tablet by mouth daily.     [provider]  cyanocobalamin 1000 MCG tablet Take 1,000 mcg by mouth daily.    [provider]  DULoxetine (CYMBALTA) 30 MG capsule Take 30 mg by mouth daily.    [provider]  etodolac (LODINE) 400 MG tablet Take 400 mg by mouth 2 (two) times daily. 09/07/14   [provider]  fluticasone (FLONASE) 50 MCG/ACT nasal spray Place into the nose. 07/21/15   [provider]  levothyroxine (SYNTHROID, LEVOTHROID) 50 MCG tablet Take 50 mcg by mouth daily before breakfast.    [provider]  losartan (COZAAR) 50 MG tablet Take 50 mg by mouth 2 (two) times daily.    [provider]  metoprolol tartrate (LOPRESSOR) 25 MG tablet Take by mouth. 04/13/16 04/13/17  [provider]  Multiple Vitamins-Minerals (MULTIVITAMIN ADULT PO) Take by mouth daily.    [provider]  omeprazole (PRILOSEC) 20 MG capsule Take 20 mg by mouth daily.    [provider]  ondansetron (ZOFRAN ODT) 4 MG disintegrating tablet Take 1 tablet (4 mg total) by mouth every 8 (eight) hours as needed for nausea or vomiting. Patient not taking: Reported on 06/23/2016 12/19/15   Loney Hering, MD  rOPINIRole (REQUIP) 0.25 MG tablet Take  0.5 mg by mouth at bedtime.     [provider]  simvastatin (ZOCOR) 20 MG tablet Take 20 mg by mouth daily.    [provider]  VESICARE 5 MG tablet TAKE 1 TABLET BY MOUTH DAILY 01/31/16   McGowan, Larene Beach A, PA-C  vitamin E 400 UNIT capsule Take 400 Units by mouth daily.    [provider]    Allergies  Allergen Reactions  . Codeine Sulfate Hives and Itching    Can take tramadol    Family History  Problem Relation Age of Onset  . Breast cancer Daughter   . Colon cancer Maternal Grandfather   . Hypertension Father   . Dementia Father   . Arthritis Mother   . Osteoporosis Mother   . Kidney disease Neg Hx   . Bladder Cancer Neg Hx     Social History Social History  Substance Use Topics  . Smoking status: Former Research scientist (life sciences)  . Smokeless tobacco: Former Systems developer     Comment: quit at age 66  . Alcohol use No     Comment: occasional    Review of Systems Constitutional: Negative for fever. Cardiovascular: Negative for chest pain. Respiratory: Negative for shortness of breath. Gastrointestinal: Negative for abdominal pain. Positive for nausea and vomiting. Negative diarrhea. Genitourinary: Negative for dysuria. Neurological: Negative for headache All other ROS negative  ____________________________________________   PHYSICAL EXAM:  VITAL SIGNS: ED Triage Vitals [11/11/16 1159]  Enc Vitals Group     BP (!) 206/111     Pulse Rate (!) 55     Resp 17     Temp 98.6 F (37 C)     Temp Source Oral     SpO2 96 %     Weight 150 lb (68 kg)     Height 5\' 1"  (1.549 m)     Head Circumference      Peak Flow      Pain Score      Pain Loc      Pain Edu?      Excl. in [place]?     Constitutional: Alert and oriented. Well appearing and in no distress. Eyes: Normal exam ENT   Head: Normocephalic and atraumatic.   Mouth/Throat: Mucous membranes are moist. Cardiovascular: Normal rate, regular rhythm.  Respiratory: Normal respiratory effort without  tachypnea nor retractions. Breath sounds are clear Gastrointestinal: Soft and nontender. No distention.   Musculoskeletal: Nontender with normal range of motion in all extremities.  Neurologic:  Normal speech and language. No gross focal neurologic deficits Skin:  Skin is warm, dry and intact.  Psychiatric: Mood and affect are normal.   ____________________________________________    EKG  EKG reviewed and interpreted by myself shows normal sinus rhythm at 56 bpm, narrow QRS, normal axis, normal intervals, no concerning ST changes. Reassuring EKG  ____________________________________________   INITIAL IMPRESSION / ASSESSMENT AND PLAN / ED COURSE  Pertinent labs & imaging results that were available during my care of the patient were reviewed by me and considered in my medical  decision making (see chart for details).  Patient presents to the emergency department for nausea and vomiting and elevated blood pressure. We will check labs, IV hydrate, and treat nausea and continue to closely monitor. Patient states the blood pressure today is largely unchanged from what has been of the past several months. She has been keeping a blood pressure diary and reporting back to her primary care doctor.  Patient's workup is largely within normal limits. Patient remains hypertensive however this appears to be unchanged over the past 1-2 months. I do not believe an acute reduction in her blood pressure is necessary or safe. We will discharge with primary care follow-up. Patient has a completely nontender abdomen with normal labs including LFTs, lipase, kidney function and troponin.  ____________________________________________   FINAL CLINICAL IMPRESSION(S) / ED DIAGNOSES  Nausea vomiting Hypertension    Harvest Dark, MD 11/11/16 1444

## 2016-11-16 ENCOUNTER — Encounter: Payer: Self-pay | Admitting: Emergency Medicine

## 2016-11-22 DIAGNOSIS — R42 Dizziness and giddiness: Secondary | ICD-10-CM | POA: Diagnosis not present

## 2016-11-22 DIAGNOSIS — M1990 Unspecified osteoarthritis, unspecified site: Secondary | ICD-10-CM | POA: Diagnosis not present

## 2016-11-22 DIAGNOSIS — R296 Repeated falls: Secondary | ICD-10-CM | POA: Diagnosis not present

## 2016-11-22 DIAGNOSIS — I10 Essential (primary) hypertension: Secondary | ICD-10-CM | POA: Diagnosis not present

## 2016-12-01 DIAGNOSIS — I10 Essential (primary) hypertension: Secondary | ICD-10-CM | POA: Diagnosis not present

## 2016-12-01 DIAGNOSIS — R2689 Other abnormalities of gait and mobility: Secondary | ICD-10-CM | POA: Diagnosis not present

## 2016-12-09 ENCOUNTER — Emergency Department: Payer: Medicare Other

## 2016-12-09 ENCOUNTER — Encounter: Payer: Self-pay | Admitting: Emergency Medicine

## 2016-12-09 ENCOUNTER — Emergency Department
Admission: EM | Admit: 2016-12-09 | Discharge: 2016-12-10 | Disposition: A | Discharge status: Home / Self Care | Payer: Medicare Other | Attending: Emergency Medicine | Admitting: Emergency Medicine

## 2016-12-09 DIAGNOSIS — Z79899 Other long term (current) drug therapy: Secondary | ICD-10-CM | POA: Insufficient documentation

## 2016-12-09 DIAGNOSIS — R112 Nausea with vomiting, unspecified: Secondary | ICD-10-CM | POA: Diagnosis not present

## 2016-12-09 DIAGNOSIS — R42 Dizziness and giddiness: Secondary | ICD-10-CM | POA: Diagnosis not present

## 2016-12-09 DIAGNOSIS — E860 Dehydration: Secondary | ICD-10-CM | POA: Diagnosis not present

## 2016-12-09 DIAGNOSIS — Z87891 Personal history of nicotine dependence: Secondary | ICD-10-CM | POA: Diagnosis not present

## 2016-12-09 DIAGNOSIS — R110 Nausea: Secondary | ICD-10-CM | POA: Diagnosis present

## 2016-12-09 DIAGNOSIS — I10 Essential (primary) hypertension: Secondary | ICD-10-CM | POA: Diagnosis not present

## 2016-12-09 DIAGNOSIS — E039 Hypothyroidism, unspecified: Secondary | ICD-10-CM | POA: Diagnosis not present

## 2016-12-09 LAB — COMPREHENSIVE METABOLIC PANEL
ALK PHOS: 58 U/L (ref 38–126)
ALT: 13 U/L — AB (ref 14–54)
ANION GAP: 7 (ref 5–15)
AST: 19 U/L (ref 15–41)
Albumin: 4.6 g/dL (ref 3.5–5.0)
BILIRUBIN TOTAL: 1 mg/dL (ref 0.3–1.2)
BUN: 24 mg/dL — ABNORMAL HIGH (ref 6–20)
CO2: 27 mmol/L (ref 22–32)
CREATININE: 1.46 mg/dL — AB (ref 0.44–1.00)
Calcium: 9.6 mg/dL (ref 8.9–10.3)
Chloride: 98 mmol/L — ABNORMAL LOW (ref 101–111)
GFR, EST AFRICAN AMERICAN: 36 mL/min — AB (ref >60)
GFR, EST NON AFRICAN AMERICAN: 31 mL/min — AB (ref >60)
Glucose, Bld: 111 mg/dL — ABNORMAL HIGH (ref 65–99)
Potassium: 4.6 mmol/L (ref 3.5–5.1)
Sodium: 132 mmol/L — ABNORMAL LOW (ref 135–145)
Total Protein: 7.8 g/dL (ref 6.5–8.1)

## 2016-12-09 LAB — CBC
HEMATOCRIT: 41.3 % (ref 35.0–47.0)
Hemoglobin: 13.9 g/dL (ref 12.0–16.0)
MCH: 29.9 pg (ref 26.0–34.0)
MCHC: 33.5 g/dL (ref 32.0–36.0)
MCV: 89.1 fL (ref 80.0–100.0)
Platelets: 212 10*3/uL (ref 150–440)
RBC: 4.64 MIL/uL (ref 3.80–5.20)
RDW: 14.4 % (ref 11.5–14.5)
WBC: 10.1 10*3/uL (ref 3.6–11.0)

## 2016-12-09 LAB — DIFFERENTIAL
Basophils Absolute: 0 10*3/uL (ref 0–0.1)
Basophils Relative: 0 %
EOS PCT: 2 %
Eosinophils Absolute: 0.2 10*3/uL (ref 0–0.7)
LYMPHS ABS: 1.8 10*3/uL (ref 1.0–3.6)
LYMPHS PCT: 17 %
MONO ABS: 0.6 10*3/uL (ref 0.2–0.9)
MONOS PCT: 6 %
NEUTROS ABS: 7.6 10*3/uL — AB (ref 1.4–6.5)
Neutrophils Relative %: 75 %

## 2016-12-09 LAB — TROPONIN I

## 2016-12-09 MED ORDER — SODIUM CHLORIDE 0.9 % IV BOLUS (SEPSIS)
1000.0000 mL | Freq: Once | INTRAVENOUS | Status: AC
  Administered 2016-12-09: 1000 mL via INTRAVENOUS

## 2016-12-09 NOTE — ED Notes (Signed)
Pt. States chronic dizziness for the past two years.  Pt. States nausea for the past two weeks.  Pt. States change in blood pressure medication in that same period.  Pt. States primary doctor has tampered down blood pressure medication in the past two weeks.  Pt. States she last ate and drank something at 1 pm today.  Pt. States she vomited a medium amount 4 pm today.  Pt. States she was also prescribed Zofran in the past two weeks that has given her relief.

## 2016-12-09 NOTE — ED Triage Notes (Signed)
Pt comes into the ED via POV from the [facility] clinic where they wanted her to gain a CT head due to ongoing dizziness and nausea.  Patient states the dizziness has been going on for over 2 years but the nausea has started in the past month.  Patient is alert and oriented x4 and in NAd at this time with even and unlabored respirations.  Patient denies any weakness and no confusion is present at this time.

## 2016-12-10 ENCOUNTER — Emergency Department: Payer: Medicare Other

## 2016-12-10 DIAGNOSIS — E860 Dehydration: Secondary | ICD-10-CM | POA: Diagnosis not present

## 2016-12-10 DIAGNOSIS — R42 Dizziness and giddiness: Secondary | ICD-10-CM | POA: Diagnosis not present

## 2016-12-10 LAB — LIPASE, BLOOD: LIPASE: 30 U/L (ref 11–51)

## 2016-12-10 MED ORDER — METOCLOPRAMIDE HCL 10 MG PO TABS: 10.0000 mg | ORAL_TABLET | Freq: Three times a day (TID) | ORAL | 0 refills | Status: DC | PRN

## 2016-12-10 NOTE — ED Provider Notes (Signed)
[facility] Emergency Department Provider Note   ____________________________________________   First MD Initiated Contact with Patient 12/09/16 2321     (approximate)  I have reviewed the triage vital signs and the nursing notes.   HISTORY  Chief Complaint Nausea and Dizziness    HPI Kaitlyn Ramos is a 83 y.o. female who comes into the hospital today with nausea and dizziness. She states that it has been going on for about a month. She had been taking some Zofran and it initially helped but it stopped helping about a week to 10 days ago. She reports that she needs to sit down with Zofran do a job. She reports that she has been having a lot of nausea and has vomited. She states that it seems to be about once each day that she feels very nauseous. She vomited today around 5 PM. The patient states that she walked from the Car to the clinic and felt very dizzy and felt like she was going to fall. She states that her blood pressure has been up and down but the bottom number has been higher than normal. Her lastblood pressure she states was around 138/113. She reports that the doctor recommended her to come in and get further evaluation. The patient states she feels improved right now. She is here for evaluation. The patient has no abdominal pain with the nausea and vomiting.   Past Medical History:  Diagnosis Date  . Cervicalgia   . Depression   . Essential hypertension   . Hemorrhoids   . History of MRSA infection   . Hyperlipidemia   . Hypertension   . Hypothyroidism   . IBS (irritable bowel syndrome)   . Osteoarthritis of multiple joints   . Restless leg   . Senile osteoporosis   . Shingles   . Sleep apnea   . Ulcerative colitis ([place])   . Urge incontinence     Patient Active Problem List   Diagnosis Date Noted  . Syncope 03/03/2016  . Pain in shoulder 11/25/2014  . Impingement syndrome of shoulder 11/25/2014  . Bilirubin in urine 10/04/2014    . Recurrent UTI 09/28/2014  . Urge incontinence 09/28/2014  . Microscopic hematuria 09/28/2014  . Cervical pain 09/28/2014  . Degenerative joint disease involving multiple joints 09/28/2014  . Involutional osteoporosis 09/28/2014  . Adult hypothyroidism 09/28/2014  . Acute low back pain 08/26/2014  . Arthralgia of hip 08/26/2014  . Combined fat and carbohydrate induced hyperlipemia 01/23/2014  . BP (high blood pressure) 01/23/2014  . Apnea, sleep 01/23/2014    Past Surgical History:  Procedure Laterality Date  . ABDOMINAL HYSTERECTOMY  1980  . cataract surgery    . CHOLECYSTECTOMY  1955  . colon polyp removal  12/2012  . ESOPHAGOGASTRODUODENOSCOPY  2014  . makoplasty Left 06/2012  . parotid gland removal  1984    Prior to Admission medications   Medication Sig Start Date End Date Taking? Authorizing Provider  amLODipine (NORVASC) 10 MG tablet Take 1 tablet (10 mg total) by mouth daily. Patient not taking: Reported on 09/22/2016 03/06/16   Epifanio Lesches, MD  Calcium Carbonate-Vitamin D (CALCIUM 500 + D) 500-125 MG-UNIT TABS Take 1 tablet by mouth daily.     [provider]  cyanocobalamin 1000 MCG tablet Take 1,000 mcg by mouth daily.    [provider]  DULoxetine (CYMBALTA) 30 MG capsule Take 30 mg by mouth daily.    [provider]  etodolac (LODINE) 400 MG tablet  Take 400 mg by mouth 2 (two) times daily. 09/07/14   [provider]  fluticasone (FLONASE) 50 MCG/ACT nasal spray Place into the nose. 07/21/15   [provider]  levothyroxine (SYNTHROID, LEVOTHROID) 50 MCG tablet Take 50 mcg by mouth daily before breakfast.    [provider]  losartan (COZAAR) 50 MG tablet Take 50 mg by mouth 2 (two) times daily.    [provider]  metoCLOPramide (REGLAN) 10 MG tablet Take 1 tablet (10 mg total) by mouth every 8 (eight) hours as needed. 12/10/16   Loney Hering, MD  metoprolol tartrate (LOPRESSOR) 25 MG tablet  Take by mouth. 04/13/16 04/13/17  [provider]  Multiple Vitamins-Minerals (MULTIVITAMIN ADULT PO) Take by mouth daily.    [provider]  omeprazole (PRILOSEC) 20 MG capsule Take 20 mg by mouth daily.    [provider]  ondansetron (ZOFRAN ODT) 4 MG disintegrating tablet Take 1 tablet (4 mg total) by mouth every 8 (eight) hours as needed for nausea or vomiting. 11/11/16   Harvest Dark, MD  rOPINIRole (REQUIP) 0.25 MG tablet Take 0.5 mg by mouth at bedtime.     [provider]  simvastatin (ZOCOR) 20 MG tablet Take 20 mg by mouth daily.    [provider]  VESICARE 5 MG tablet TAKE 1 TABLET BY MOUTH DAILY 01/31/16   McGowan, Larene Beach A, PA-C  vitamin E 400 UNIT capsule Take 400 Units by mouth daily.    [provider]    Allergies Codeine sulfate and Codeine  Family History  Problem Relation Age of Onset  . Breast cancer Daughter   . Colon cancer Maternal Grandfather   . Hypertension Father   . Dementia Father   . Arthritis Mother   . Osteoporosis Mother   . Kidney disease Neg Hx   . Bladder Cancer Neg Hx     Social History Social History  Substance Use Topics  . Smoking status: Former Research scientist (life sciences)  . Smokeless tobacco: Former Systems developer     Comment: quit at age 43  . Alcohol use No     Comment: occasional    Review of Systems  Constitutional: No fever/chills Eyes: No visual changes. ENT: No sore throat. Cardiovascular: Denies chest pain. Respiratory: Denies shortness of breath. Gastrointestinal: nausea and vomiting withNo abdominal pain.   No diarrhea.  No constipation. Genitourinary: Negative for dysuria. Musculoskeletal: Negative for back pain. Skin: Negative for rash. Neurological: lightheadedness and dizziness   ____________________________________________   PHYSICAL EXAM:  VITAL SIGNS: ED Triage Vitals [12/09/16 2008]  Enc Vitals Group     BP (!) 163/87     Pulse Rate (!) 55     Resp 18     Temp 97.8  F (36.6 C)     Temp Source Oral     SpO2 99 %     Weight 150 lb (68 kg)     Height 5\' 1"  (1.549 m)     Head Circumference      Peak Flow      Pain Score      Pain Loc      Pain Edu?      Excl. in [place]?     Constitutional: Alert and oriented. Well appearing and in no acute distress. Eyes: Conjunctivae are normal. PERRL. EOMI. Head: Atraumatic. Nose: No congestion/rhinnorhea. Mouth/Throat: Mucous membranes are moist.  Oropharynx non-erythematous. Cardiovascular: Normal rate, regular rhythm. Grossly normal heart sounds.  Good peripheral circulation. Respiratory: Normal respiratory effort.  No  retractions. Lungs CTAB. Gastrointestinal: Soft and nontender. No distention. Positive bowel sounds Musculoskeletal: No lower extremity tenderness nor edema.   Neurologic:  Normal speech and language. Cranial nerves II through XII are grossly intact with no focal motor or neuro deficit Skin:  Skin is warm, dry and intact.  Psychiatric: Mood and affect are normal.   ____________________________________________   LABS (all labs ordered are listed, but only abnormal results are displayed)  Labs Reviewed  DIFFERENTIAL - Abnormal; Notable for the following:       Result Value   Neutro Abs 7.6 (*)    All other components within normal limits  COMPREHENSIVE METABOLIC PANEL - Abnormal; Notable for the following:    Sodium 132 (*)    Chloride 98 (*)    Glucose, Bld 111 (*)    BUN 24 (*)    Creatinine, Ser 1.46 (*)    ALT 13 (*)    GFR calc non Af Amer 31 (*)    GFR calc Af Amer 36 (*)    All other components within normal limits  CBC  TROPONIN I  LIPASE, BLOOD  CBG MONITORING, ED   ____________________________________________  EKG  ED ECG REPORT I, Loney Hering, the attending physician, personally viewed and interpreted this ECG.   Date: 12/09/2016  EKG Time: 2019  Rate: 59  Rhythm: sinus bradycardia  Axis: left axis deviation  Intervals:none  ST&T Change:  none  ____________________________________________  RADIOLOGY  Ct Head Wo Contrast  Result Date: 12/09/2016 CLINICAL DATA:  Dizziness and nausea for more than 2 years. Recent worsening of symptoms. EXAM: CT HEAD WITHOUT CONTRAST TECHNIQUE: Contiguous axial images were obtained from the base of the skull through the vertex without intravenous contrast. COMPARISON:  CT head and MRI brain 03/03/2016. FINDINGS: Brain: There is no evidence of acute intracranial hemorrhage, mass lesion, brain edema or extra-axial fluid collection. There is generalized prominence of the ventricles and subarachnoid spaces consistent with stable mild atrophy. Extensive chronic small vessel ischemic changes are present within periventricular and subcortical white matter, similar to the previous study. There is no CT evidence of acute cortical infarction. Vascular: Intracranial vascular calcifications. No hyperdense vessel identified. Skull: Negative for fracture or focal lesion. Sinuses/Orbits: The visualized paranasal sinuses and mastoid air cells are clear. No orbital abnormalities are seen. Other: None. IMPRESSION: No acute intracranial findings. Atrophy and chronic small vessel ischemic changes, similar to previous studies. Electronically Signed   By: Richardean Sale M.D.   On: 12/09/2016 20:44   Mr Brain Wo Contrast  Result Date: 12/10/2016 CLINICAL DATA:  83 year old female with ongoing dizziness and nausea. Chronic symptoms but progressive in the past month. EXAM: MRI HEAD WITHOUT CONTRAST TECHNIQUE: Multiplanar, multiecho pulse sequences of the brain and surrounding structures were obtained without intravenous contrast. COMPARISON:  Head CT without contrast 12/09/2016. Brain MRI 03/03/2016. FINDINGS: Brain: Stable cerebral volume. No restricted diffusion to suggest acute infarction. No midline shift, mass effect, evidence of mass lesion, ventriculomegaly, extra-axial collection or acute intracranial hemorrhage.  Cervicomedullary junction and pituitary are within normal limits. Stable gray and white matter signal throughout the brain; widespread patchy and confluent bilateral cerebral white matter T2 and FLAIR hyperintensity. Periventricular, central, and subcortical involvement. No definite chronic cerebral blood products. Deep gray matter nuclei and cerebellum relatively spared. Patchy T2 hyperintensity in the brainstem, mostly the pons, appears stable. Vascular: Major intracranial vascular flow voids are stable since 2017. Mild to moderate intracranial artery dolichoectasia again noted. Skull and upper cervical spine: Stable  and negative for age. Sinuses/Orbits: Stable and negative. Other: Visible internal auditory structures appear stable and negative. Scalp and face soft tissues appear negative. IMPRESSION: 1.  No acute intracranial abnormality. 2. Stable noncontrast MRI appearance the brain since 2017. Moderately advanced nonspecific signal changes in the cerebral white matter and pons, favor chronic small vessel disease related. Electronically Signed   By: Genevie Ann M.D.   On: 12/10/2016 01:13    ____________________________________________   PROCEDURES  Procedure(s) performed: None  Procedures  Critical Care performed: No  ____________________________________________   INITIAL IMPRESSION / ASSESSMENT AND PLAN / ED COURSE  Pertinent labs & imaging results that were available during my care of the patient were reviewed by me and considered in my medical decision making (see chart for details).  This is an 83 year old female who comes into the hospital today with some nausea, vomiting and dizziness. The patient's creatinine is slightly elevated at 1.46. I did give the patient a liter of normal saline. I sent her for an MRI to evaluate for causes of her dizziness and it was unremarkable. The patient does not have any dizziness now that she's been able to drink without any vomiting here. I feel that  the patient needs to be evaluated by GI and that her dizziness may have been due to some dehydration. The patient and her family understand and agree. As her MRI is negative she'll be discharged home to follow-up with her primary care physician. The patient has no further complaints or concerns and she'll be discharged home.      ____________________________________________   FINAL CLINICAL IMPRESSION(S) / ED DIAGNOSES  Final diagnoses:  Dehydration  Dizziness  Non-intractable vomiting with nausea, unspecified vomiting type      NEW MEDICATIONS STARTED DURING THIS VISIT:  New Prescriptions   METOCLOPRAMIDE (REGLAN) 10 MG TABLET    Take 1 tablet (10 mg total) by mouth every 8 (eight) hours as needed.     Note:  This document was prepared using Dragon voice recognition software and may include unintentional dictation errors.    Loney Hering, MD 12/10/16 [phone]

## 2016-12-10 NOTE — ED Notes (Signed)
MRI called and stated they are ready for patient,  Kaitlyn Ramos, nt called and agreed to accompany patient.

## 2016-12-10 NOTE — ED Notes (Signed)
Pt. Using bathroom before going to MRI.

## 2016-12-10 NOTE — ED Notes (Signed)
Pt. Going home with family. 

## 2016-12-10 NOTE — Discharge Instructions (Signed)
Please follow back up with your primary care physician.

## 2016-12-12 ENCOUNTER — Ambulatory Visit: Payer: Medicare Other | Admitting: Urology

## 2016-12-12 ENCOUNTER — Encounter: Payer: Self-pay | Admitting: Urology

## 2016-12-13 DIAGNOSIS — R42 Dizziness and giddiness: Secondary | ICD-10-CM | POA: Diagnosis not present

## 2016-12-13 DIAGNOSIS — F419 Anxiety disorder, unspecified: Secondary | ICD-10-CM | POA: Diagnosis not present

## 2016-12-13 DIAGNOSIS — I10 Essential (primary) hypertension: Secondary | ICD-10-CM | POA: Diagnosis not present

## 2016-12-15 DIAGNOSIS — R2681 Unsteadiness on feet: Secondary | ICD-10-CM | POA: Diagnosis not present

## 2016-12-20 DIAGNOSIS — R2681 Unsteadiness on feet: Secondary | ICD-10-CM | POA: Diagnosis not present

## 2016-12-23 DIAGNOSIS — R2681 Unsteadiness on feet: Secondary | ICD-10-CM | POA: Diagnosis not present

## 2016-12-26 DIAGNOSIS — H353132 Nonexudative age-related macular degeneration, bilateral, intermediate dry stage: Secondary | ICD-10-CM | POA: Diagnosis not present

## 2016-12-26 DIAGNOSIS — H359 Unspecified retinal disorder: Secondary | ICD-10-CM | POA: Diagnosis not present

## 2016-12-27 DIAGNOSIS — I10 Essential (primary) hypertension: Secondary | ICD-10-CM | POA: Diagnosis not present

## 2016-12-27 DIAGNOSIS — E782 Mixed hyperlipidemia: Secondary | ICD-10-CM | POA: Diagnosis not present

## 2016-12-27 DIAGNOSIS — R42 Dizziness and giddiness: Secondary | ICD-10-CM | POA: Diagnosis not present

## 2016-12-27 DIAGNOSIS — G4733 Obstructive sleep apnea (adult) (pediatric): Secondary | ICD-10-CM | POA: Diagnosis not present

## 2016-12-28 DIAGNOSIS — R2681 Unsteadiness on feet: Secondary | ICD-10-CM | POA: Diagnosis not present

## 2016-12-30 DIAGNOSIS — R2681 Unsteadiness on feet: Secondary | ICD-10-CM | POA: Diagnosis not present

## 2017-01-03 DIAGNOSIS — H3589 Other specified retinal disorders: Secondary | ICD-10-CM | POA: Diagnosis not present

## 2017-01-03 DIAGNOSIS — H359 Unspecified retinal disorder: Secondary | ICD-10-CM | POA: Diagnosis not present

## 2017-01-04 DIAGNOSIS — R2681 Unsteadiness on feet: Secondary | ICD-10-CM | POA: Diagnosis not present

## 2017-01-05 DIAGNOSIS — R2681 Unsteadiness on feet: Secondary | ICD-10-CM | POA: Diagnosis not present

## 2017-01-09 DIAGNOSIS — R2681 Unsteadiness on feet: Secondary | ICD-10-CM | POA: Diagnosis not present

## 2017-01-11 DIAGNOSIS — R2681 Unsteadiness on feet: Secondary | ICD-10-CM | POA: Diagnosis not present

## 2017-02-14 ENCOUNTER — Other Ambulatory Visit: Payer: Self-pay | Admitting: Urology

## 2017-02-14 DIAGNOSIS — N3281 Overactive bladder: Secondary | ICD-10-CM

## 2017-02-22 DIAGNOSIS — R2681 Unsteadiness on feet: Secondary | ICD-10-CM | POA: Diagnosis not present

## 2017-03-16 ENCOUNTER — Emergency Department
Admission: EM | Admit: 2017-03-16 | Discharge: 2017-03-16 | Disposition: A | Discharge status: Home / Self Care | Payer: Medicare Other | Attending: Emergency Medicine | Admitting: Emergency Medicine

## 2017-03-16 ENCOUNTER — Emergency Department: Payer: Medicare Other

## 2017-03-16 ENCOUNTER — Other Ambulatory Visit: Payer: Self-pay

## 2017-03-16 DIAGNOSIS — I10 Essential (primary) hypertension: Secondary | ICD-10-CM | POA: Insufficient documentation

## 2017-03-16 DIAGNOSIS — W1800XA Striking against unspecified object with subsequent fall, initial encounter: Secondary | ICD-10-CM | POA: Insufficient documentation

## 2017-03-16 DIAGNOSIS — S0990XA Unspecified injury of head, initial encounter: Secondary | ICD-10-CM | POA: Diagnosis not present

## 2017-03-16 DIAGNOSIS — E039 Hypothyroidism, unspecified: Secondary | ICD-10-CM | POA: Insufficient documentation

## 2017-03-16 DIAGNOSIS — Y92009 Unspecified place in unspecified non-institutional (private) residence as the place of occurrence of the external cause: Secondary | ICD-10-CM | POA: Insufficient documentation

## 2017-03-16 DIAGNOSIS — Y939 Activity, unspecified: Secondary | ICD-10-CM | POA: Diagnosis not present

## 2017-03-16 DIAGNOSIS — Z79899 Other long term (current) drug therapy: Secondary | ICD-10-CM | POA: Insufficient documentation

## 2017-03-16 DIAGNOSIS — Z87891 Personal history of nicotine dependence: Secondary | ICD-10-CM | POA: Diagnosis not present

## 2017-03-16 DIAGNOSIS — Y999 Unspecified external cause status: Secondary | ICD-10-CM | POA: Insufficient documentation

## 2017-03-16 DIAGNOSIS — W19XXXA Unspecified fall, initial encounter: Secondary | ICD-10-CM

## 2017-03-16 DIAGNOSIS — S199XXA Unspecified injury of neck, initial encounter: Secondary | ICD-10-CM | POA: Diagnosis not present

## 2017-03-16 DIAGNOSIS — M542 Cervicalgia: Secondary | ICD-10-CM | POA: Diagnosis not present

## 2017-03-16 NOTE — Discharge Instructions (Signed)

## 2017-03-16 NOTE — ED Notes (Signed)
Pt discharged home after verbalizing understanding of discharge instructions; nad noted. 

## 2017-03-16 NOTE — ED Provider Notes (Signed)
[facility] Emergency Department Provider Note  ____________________________________________  Time seen: Approximately 4:35 PM  I have reviewed the triage vital signs and the nursing notes.   HISTORY  Chief Complaint Fall   HPI Kaitlyn Ramos is a 83 y.o. female with a history of hypertension, hypothyroidism, osteoarthritis who presents for evaluation of a mechanical fall. Patient was walking in her house when she lost her balance and fell. She hit her head onto a dresser. No LOC. She is not on any blood thinners. She is complaining of a mild headache at the site of the trauma and the posterior aspect of her head that has been constant since the fall. No changes in vision, no neck pain, no back pain, no chest pain, no extremity pain. Patient denies any preceding symptoms such as dizziness, lightheadedness, headache, chest pain or palpitations before the fall. Patient has a history of falls over the course of the last 2 years. She is currently being seen by her primary care doctor but is unknown the etiology of her falls at this time.  Past Medical History:  Diagnosis Date  . Cervicalgia   . Depression   . Essential hypertension   . Hemorrhoids   . History of MRSA infection   . Hyperlipidemia   . Hypertension   . Hypothyroidism   . IBS (irritable bowel syndrome)   . Osteoarthritis of multiple joints   . Restless leg   . Senile osteoporosis   . Shingles   . Sleep apnea   . Ulcerative colitis ([place])   . Urge incontinence     Patient Active Problem List   Diagnosis Date Noted  . Syncope 03/03/2016  . Pain in shoulder 11/25/2014  . Impingement syndrome of shoulder 11/25/2014  . Bilirubin in urine 10/04/2014  . Recurrent UTI 09/28/2014  . Urge incontinence 09/28/2014  . Microscopic hematuria 09/28/2014  . Cervical pain 09/28/2014  . Degenerative joint disease involving multiple joints 09/28/2014  . Involutional osteoporosis 09/28/2014  . Adult  hypothyroidism 09/28/2014  . Acute low back pain 08/26/2014  . Arthralgia of hip 08/26/2014  . Combined fat and carbohydrate induced hyperlipemia 01/23/2014  . BP (high blood pressure) 01/23/2014  . Apnea, sleep 01/23/2014    Past Surgical History:  Procedure Laterality Date  . ABDOMINAL HYSTERECTOMY  1980  . cataract surgery    . CHOLECYSTECTOMY  1955  . colon polyp removal  12/2012  . ESOPHAGOGASTRODUODENOSCOPY  2014  . makoplasty Left 06/2012  . parotid gland removal  1984    Prior to Admission medications   Medication Sig Start Date End Date Taking? Authorizing Provider  amLODipine (NORVASC) 10 MG tablet Take 1 tablet (10 mg total) by mouth daily. Patient not taking: Reported on 09/22/2016 03/06/16   Epifanio Lesches, MD  Calcium Carbonate-Vitamin D (CALCIUM 500 + D) 500-125 MG-UNIT TABS Take 1 tablet by mouth daily.     [provider]  cyanocobalamin 1000 MCG tablet Take 1,000 mcg by mouth daily.    [provider]  DULoxetine (CYMBALTA) 30 MG capsule Take 30 mg by mouth daily.    [provider]  etodolac (LODINE) 400 MG tablet Take 400 mg by mouth 2 (two) times daily. 09/07/14   [provider]  fluticasone (FLONASE) 50 MCG/ACT nasal spray Place into the nose. 07/21/15   [provider]  levothyroxine (SYNTHROID, LEVOTHROID) 50 MCG tablet Take 50 mcg by mouth daily before breakfast.    [provider]  losartan (COZAAR) 50 MG  tablet Take 50 mg by mouth 2 (two) times daily.    [provider]  metoCLOPramide (REGLAN) 10 MG tablet Take 1 tablet (10 mg total) by mouth every 8 (eight) hours as needed. 12/10/16   Loney Hering, MD  metoprolol tartrate (LOPRESSOR) 25 MG tablet Take by mouth. 04/13/16 04/13/17  [provider]  Multiple Vitamins-Minerals (MULTIVITAMIN ADULT PO) Take by mouth daily.    [provider]  omeprazole (PRILOSEC) 20 MG capsule Take 20 mg by mouth daily.    [provider]  ondansetron (ZOFRAN ODT) 4 MG disintegrating tablet Take 1 tablet (4 mg total) by mouth every 8 (eight) hours as needed for nausea or vomiting. 11/11/16   Harvest Dark, MD  rOPINIRole (REQUIP) 0.25 MG tablet Take 0.5 mg by mouth at bedtime.     [provider]  simvastatin (ZOCOR) 20 MG tablet Take 20 mg by mouth daily.    [provider]  VESICARE 5 MG tablet TAKE 1 TABLET BY MOUTH ONCE A DAY 02/15/17   McGowan, Larene Beach A, PA-C  vitamin E 400 UNIT capsule Take 400 Units by mouth daily.    [provider]    Allergies Codeine sulfate and Codeine  Family History  Problem Relation Age of Onset  . Breast cancer Daughter   . Colon cancer Maternal Grandfather   . Hypertension Father   . Dementia Father   . Arthritis Mother   . Osteoporosis Mother   . Kidney disease Neg Hx   . Bladder Cancer Neg Hx     Social History Social History   Tobacco Use  . Smoking status: Former Research scientist (life sciences)  . Smokeless tobacco: Former Systems developer  . Tobacco comment: quit at age 45  Substance Use Topics  . Alcohol use: No    Alcohol/week: 0.0 oz    Comment: occasional  . Drug use: No    Review of Systems Constitutional: Negative for fever. Eyes: Negative for visual changes. ENT: Negative for facial injury or neck injury Cardiovascular: Negative for chest injury. Respiratory: Negative for shortness of breath. Negative for chest wall injury. Gastrointestinal: Negative for abdominal pain or injury. Genitourinary: Negative for dysuria. Musculoskeletal: Negative for back injury, negative for arm or leg pain. Skin: Negative for laceration/abrasions. Neurological: + head injury.   ____________________________________________   PHYSICAL EXAM:  VITAL SIGNS: ED Triage Vitals [03/16/17 1441]  Enc Vitals Group     BP (!) 171/77     Pulse Rate (!) 51     Resp 16     Temp 97.7 F (36.5 C)     Temp Source Oral     SpO2 95 %     Weight 135 lb (61.2 kg)      Height 5' (1.524 m)     Head Circumference      Peak Flow      Pain Score      Pain Loc      Pain Edu?      Excl. in [place]?    Constitutional: Alert and oriented. No acute distress. Does not appear intoxicated. HEENT Head: Normocephalic and atraumatic. Face: No facial bony tenderness. Stable midface Ears: No hemotympanum bilaterally. No Battle sign Eyes: No eye injury. PERRL. No raccoon eyes Nose: Nontender. No epistaxis. No rhinorrhea Mouth/Throat: Mucous membranes are moist. No oropharyngeal blood. No dental injury. Airway patent without stridor. Normal voice. Neck: no C-collar in place. No midline c-spine tenderness.  Cardiovascular: Normal rate, regular rhythm. Normal and symmetric distal pulses are present  in all extremities. Pulmonary/Chest: Chest wall is stable and nontender to palpation/compression. Normal respiratory effort. Breath sounds are normal. No crepitus.  Abdominal: Soft, nontender, non distended. Musculoskeletal: Nontender with normal full range of motion in all extremities. No deformities. No thoracic or lumbar midline spinal tenderness. Pelvis is stable. Skin: Skin is warm, dry and intact. No abrasions or contutions. Psychiatric: Speech and behavior are appropriate. Neurological: Normal speech and language. Moves all extremities to command. No gross focal neurologic deficits are appreciated.  Glascow Coma Score: 4 - Opens eyes on own 6 - Follows simple motor commands 5 - Alert and oriented GCS: 15   ____________________________________________   LABS (all labs ordered are listed, but only abnormal results are displayed)  Labs Reviewed - No data to display ____________________________________________  EKG  none  ____________________________________________  RADIOLOGY  CT head and cspine: Negative  ____________________________________________   PROCEDURES  Procedure(s) performed: None Procedures Critical Care performed:   None ____________________________________________   INITIAL IMPRESSION / ASSESSMENT AND PLAN / ED COURSE  83 y.o. female with a history of hypertension, hypothyroidism, osteoarthritis who presents for evaluation of a mechanical fall with head trauma. Patient is extremely well-appearing, neurologically intact, no obvious trauma to the head, no signs or symptoms of basilar skull fracture. CT head and neck negative for acute fracture. This was a mechanical fall therefore I don't believe the patient requires any blood work at this time. We'll discharge home. Patient does have a walker. Recommended close follow-up with primary care doctor.      As part of my medical decision making, I reviewed the following data within the [place] History obtained from family, Nursing notes reviewed and incorporated, Radiograph reviewed , Notes from prior ED visits and [place] Controlled Substance Database    Pertinent labs & imaging results that were available during my care of the patient were reviewed by me and considered in my medical decision making (see chart for details).    ____________________________________________   FINAL CLINICAL IMPRESSION(S) / ED DIAGNOSES  Final diagnoses:  Fall, initial encounter  Injury of head, initial encounter      NEW MEDICATIONS STARTED DURING THIS VISIT:  ED Discharge Orders    None       Note:  This document was prepared using Dragon voice recognition software and may include unintentional dictation errors.    Rudene Re, MD 03/16/17 [phone]

## 2017-03-16 NOTE — ED Triage Notes (Signed)
Pt states she lost her balance and fell back and hit her head. Denies LOC, visual changes or any other complaints. Is not on any blood thinners. States the nurse at [facility] advised her to come to the ED .

## 2017-03-28 DIAGNOSIS — Z9181 History of falling: Secondary | ICD-10-CM | POA: Diagnosis not present

## 2017-03-28 DIAGNOSIS — R42 Dizziness and giddiness: Secondary | ICD-10-CM | POA: Diagnosis not present

## 2017-03-28 DIAGNOSIS — N183 Chronic kidney disease, stage 3 (moderate): Secondary | ICD-10-CM | POA: Diagnosis not present

## 2017-03-28 DIAGNOSIS — I10 Essential (primary) hypertension: Secondary | ICD-10-CM | POA: Diagnosis not present

## 2017-04-03 ENCOUNTER — Inpatient Hospital Stay: Payer: Medicare Other

## 2017-04-03 ENCOUNTER — Emergency Department: Payer: Medicare Other

## 2017-04-03 ENCOUNTER — Encounter: Payer: Self-pay | Admitting: Emergency Medicine

## 2017-04-03 ENCOUNTER — Other Ambulatory Visit: Payer: Self-pay

## 2017-04-03 ENCOUNTER — Inpatient Hospital Stay
Admission: EM | Admit: 2017-04-03 | Discharge: 2017-04-06 | DRG: 065 | Disposition: A | Discharge status: Skilled Nursing Facility | Payer: Medicare Other | Attending: Specialist | Admitting: Specialist

## 2017-04-03 DIAGNOSIS — R4182 Altered mental status, unspecified: Secondary | ICD-10-CM | POA: Diagnosis not present

## 2017-04-03 DIAGNOSIS — G9340 Encephalopathy, unspecified: Secondary | ICD-10-CM | POA: Diagnosis not present

## 2017-04-03 DIAGNOSIS — Z9181 History of falling: Secondary | ICD-10-CM

## 2017-04-03 DIAGNOSIS — R270 Ataxia, unspecified: Secondary | ICD-10-CM | POA: Diagnosis present

## 2017-04-03 DIAGNOSIS — Z66 Do not resuscitate: Secondary | ICD-10-CM | POA: Diagnosis present

## 2017-04-03 DIAGNOSIS — I959 Hypotension, unspecified: Secondary | ICD-10-CM | POA: Diagnosis present

## 2017-04-03 DIAGNOSIS — I639 Cerebral infarction, unspecified: Secondary | ICD-10-CM | POA: Diagnosis present

## 2017-04-03 DIAGNOSIS — F329 Major depressive disorder, single episode, unspecified: Secondary | ICD-10-CM | POA: Diagnosis present

## 2017-04-03 DIAGNOSIS — I10 Essential (primary) hypertension: Secondary | ICD-10-CM | POA: Diagnosis present

## 2017-04-03 DIAGNOSIS — M159 Polyosteoarthritis, unspecified: Secondary | ICD-10-CM | POA: Diagnosis present

## 2017-04-03 DIAGNOSIS — G8191 Hemiplegia, unspecified affecting right dominant side: Secondary | ICD-10-CM | POA: Diagnosis present

## 2017-04-03 DIAGNOSIS — K589 Irritable bowel syndrome without diarrhea: Secondary | ICD-10-CM | POA: Diagnosis present

## 2017-04-03 DIAGNOSIS — Z23 Encounter for immunization: Secondary | ICD-10-CM | POA: Diagnosis not present

## 2017-04-03 DIAGNOSIS — M810 Age-related osteoporosis without current pathological fracture: Secondary | ICD-10-CM | POA: Diagnosis present

## 2017-04-03 DIAGNOSIS — G2581 Restless legs syndrome: Secondary | ICD-10-CM | POA: Diagnosis present

## 2017-04-03 DIAGNOSIS — Z8262 Family history of osteoporosis: Secondary | ICD-10-CM

## 2017-04-03 DIAGNOSIS — N179 Acute kidney failure, unspecified: Secondary | ICD-10-CM | POA: Diagnosis present

## 2017-04-03 DIAGNOSIS — E785 Hyperlipidemia, unspecified: Secondary | ICD-10-CM | POA: Diagnosis present

## 2017-04-03 DIAGNOSIS — Z87891 Personal history of nicotine dependence: Secondary | ICD-10-CM | POA: Diagnosis not present

## 2017-04-03 DIAGNOSIS — R4701 Aphasia: Secondary | ICD-10-CM | POA: Diagnosis present

## 2017-04-03 DIAGNOSIS — G4730 Sleep apnea, unspecified: Secondary | ICD-10-CM | POA: Diagnosis present

## 2017-04-03 DIAGNOSIS — Z8249 Family history of ischemic heart disease and other diseases of the circulatory system: Secondary | ICD-10-CM | POA: Diagnosis not present

## 2017-04-03 DIAGNOSIS — F05 Delirium due to known physiological condition: Secondary | ICD-10-CM | POA: Diagnosis not present

## 2017-04-03 DIAGNOSIS — I6389 Other cerebral infarction: Secondary | ICD-10-CM | POA: Diagnosis not present

## 2017-04-03 DIAGNOSIS — Z803 Family history of malignant neoplasm of breast: Secondary | ICD-10-CM | POA: Diagnosis not present

## 2017-04-03 DIAGNOSIS — Z8261 Family history of arthritis: Secondary | ICD-10-CM

## 2017-04-03 DIAGNOSIS — R296 Repeated falls: Secondary | ICD-10-CM | POA: Diagnosis present

## 2017-04-03 DIAGNOSIS — R29704 NIHSS score 4: Secondary | ICD-10-CM | POA: Diagnosis present

## 2017-04-03 DIAGNOSIS — W19XXXA Unspecified fall, initial encounter: Secondary | ICD-10-CM | POA: Diagnosis not present

## 2017-04-03 DIAGNOSIS — E039 Hypothyroidism, unspecified: Secondary | ICD-10-CM | POA: Diagnosis present

## 2017-04-03 DIAGNOSIS — Z8614 Personal history of Methicillin resistant Staphylococcus aureus infection: Secondary | ICD-10-CM

## 2017-04-03 DIAGNOSIS — G9349 Other encephalopathy: Secondary | ICD-10-CM | POA: Diagnosis present

## 2017-04-03 DIAGNOSIS — S0990XA Unspecified injury of head, initial encounter: Secondary | ICD-10-CM | POA: Diagnosis not present

## 2017-04-03 DIAGNOSIS — I63532 Cerebral infarction due to unspecified occlusion or stenosis of left posterior cerebral artery: Secondary | ICD-10-CM | POA: Diagnosis not present

## 2017-04-03 DIAGNOSIS — R0602 Shortness of breath: Secondary | ICD-10-CM | POA: Diagnosis not present

## 2017-04-03 LAB — COMPREHENSIVE METABOLIC PANEL
ALK PHOS: 51 U/L (ref 38–126)
ALT: 12 U/L — AB (ref 14–54)
AST: 25 U/L (ref 15–41)
Albumin: 4.3 g/dL (ref 3.5–5.0)
Anion gap: 10 (ref 5–15)
BUN: 24 mg/dL — AB (ref 6–20)
CO2: 25 mmol/L (ref 22–32)
CREATININE: 1.45 mg/dL — AB (ref 0.44–1.00)
Calcium: 9.6 mg/dL (ref 8.9–10.3)
Chloride: 102 mmol/L (ref 101–111)
GFR calc non Af Amer: 31 mL/min — ABNORMAL LOW (ref >60)
GFR, EST AFRICAN AMERICAN: 36 mL/min — AB (ref >60)
Glucose, Bld: 150 mg/dL — ABNORMAL HIGH (ref 65–99)
Potassium: 4.5 mmol/L (ref 3.5–5.1)
SODIUM: 137 mmol/L (ref 135–145)
Total Bilirubin: 1 mg/dL (ref 0.3–1.2)
Total Protein: 7.6 g/dL (ref 6.5–8.1)

## 2017-04-03 LAB — URINALYSIS, COMPLETE (UACMP) WITH MICROSCOPIC
Bacteria, UA: NONE SEEN
Bilirubin Urine: NEGATIVE
Glucose, UA: NEGATIVE mg/dL
Hgb urine dipstick: NEGATIVE
KETONES UR: NEGATIVE mg/dL
Leukocytes, UA: NEGATIVE
Nitrite: NEGATIVE
PROTEIN: NEGATIVE mg/dL
SQUAMOUS EPITHELIAL / LPF: NONE SEEN
Specific Gravity, Urine: 1.014 (ref 1.005–1.030)
pH: 6 (ref 5.0–8.0)

## 2017-04-03 LAB — CBC WITH DIFFERENTIAL/PLATELET
Basophils Absolute: 0 10*3/uL (ref 0–0.1)
Basophils Relative: 0 %
EOS ABS: 0 10*3/uL (ref 0–0.7)
Eosinophils Relative: 0 %
HCT: 42.5 % (ref 35.0–47.0)
HEMOGLOBIN: 14 g/dL (ref 12.0–16.0)
LYMPHS ABS: 0.6 10*3/uL — AB (ref 1.0–3.6)
LYMPHS PCT: 6 %
MCH: 29.3 pg (ref 26.0–34.0)
MCHC: 32.8 g/dL (ref 32.0–36.0)
MCV: 89.4 fL (ref 80.0–100.0)
Monocytes Absolute: 0.2 10*3/uL (ref 0.2–0.9)
Monocytes Relative: 2 %
NEUTROS PCT: 92 %
Neutro Abs: 9.1 10*3/uL — ABNORMAL HIGH (ref 1.4–6.5)
Platelets: 151 10*3/uL (ref 150–440)
RBC: 4.76 MIL/uL (ref 3.80–5.20)
RDW: 14.7 % — ABNORMAL HIGH (ref 11.5–14.5)
WBC: 10 10*3/uL (ref 3.6–11.0)

## 2017-04-03 LAB — CK: CK TOTAL: 61 U/L (ref 38–234)

## 2017-04-03 LAB — TROPONIN I

## 2017-04-03 LAB — GLUCOSE, CAPILLARY: GLUCOSE-CAPILLARY: 130 mg/dL — AB (ref 65–99)

## 2017-04-03 LAB — AMMONIA: Ammonia: 17 umol/L (ref 9–35)

## 2017-04-03 MED ORDER — ASPIRIN 325 MG PO TABS
325.0000 mg | ORAL_TABLET | Freq: Every day | ORAL | Status: DC
  Administered 2017-04-03 – 2017-04-05 (×3): 325 mg via ORAL
  Filled 2017-04-03 (×3): qty 1

## 2017-04-03 MED ORDER — DEXTROSE IN LACTATED RINGERS 5 % IV SOLN
INTRAVENOUS | Status: AC
  Administered 2017-04-03: 23:00:00 via INTRAVENOUS

## 2017-04-03 MED ORDER — STROKE: EARLY STAGES OF RECOVERY BOOK
Freq: Once | Status: AC
  Administered 2017-04-03: 23:00:00

## 2017-04-03 MED ORDER — INFLUENZA VAC SPLIT HIGH-DOSE 0.5 ML IM SUSY
0.5000 mL | PREFILLED_SYRINGE | INTRAMUSCULAR | Status: AC
  Administered 2017-04-05: 0.5 mL via INTRAMUSCULAR
  Filled 2017-04-03: qty 0.5

## 2017-04-03 MED ORDER — SENNOSIDES-DOCUSATE SODIUM 8.6-50 MG PO TABS: 1.0000 | ORAL_TABLET | Freq: Every evening | ORAL | Status: DC | PRN

## 2017-04-03 MED ORDER — ACETAMINOPHEN 160 MG/5ML PO SOLN
650.0000 mg | ORAL | Status: DC | PRN
  Filled 2017-04-03: qty 20.3

## 2017-04-03 MED ORDER — HEPARIN SODIUM (PORCINE) 5000 UNIT/ML IJ SOLN
5000.0000 [IU] | Freq: Three times a day (TID) | INTRAMUSCULAR | Status: DC
  Administered 2017-04-03 – 2017-04-06 (×8): 5000 [IU] via SUBCUTANEOUS
  Filled 2017-04-03 (×8): qty 1

## 2017-04-03 MED ORDER — ACETAMINOPHEN 650 MG RE SUPP: 650.0000 mg | RECTAL | Status: DC | PRN

## 2017-04-03 MED ORDER — GADOBENATE DIMEGLUMINE 529 MG/ML IV SOLN
10.0000 mL | Freq: Once | INTRAVENOUS | Status: AC | PRN
  Administered 2017-04-03: 6 mL via INTRAVENOUS

## 2017-04-03 MED ORDER — ACETAMINOPHEN 325 MG PO TABS: 650.0000 mg | ORAL_TABLET | ORAL | Status: DC | PRN

## 2017-04-03 MED ORDER — ATORVASTATIN CALCIUM 20 MG PO TABS
20.0000 mg | ORAL_TABLET | Freq: Every day | ORAL | Status: DC
  Administered 2017-04-03: 23:00:00 20 mg via ORAL
  Filled 2017-04-03: qty 1

## 2017-04-03 NOTE — ED Notes (Signed)
Unable to call report at this time

## 2017-04-03 NOTE — H&P (Signed)
[facility] NAME: Kaitlyn Ramos    MR#:  [id number]  DATE OF BIRTH:  03-Nov-1927  DATE OF ADMISSION:  04/03/2017  PRIMARY CARE PHYSICIAN: Maryland Pink, MD   REQUESTING/REFERRING PHYSICIAN:   CHIEF COMPLAINT:   Chief Complaint  Patient presents with  . Fall  . Altered Mental Status    HISTORY OF PRESENT ILLNESS: Kaitlyn Ramos  is a 83 y.o. female with a known history per below, last neurologically normal around 68 PM-was texting her daughter at the time, found this afternoon at the independent living facility by EMS after fall, noted altered mental status, ER workup noted for creatinine 1.4 with baseline normal, CT head and cervical spine were negative, urinalysis negative, patient evaluated the bedside, daughter present, patient noted to be a phasic with right pronator drift, patient is now been admitted with probable acute left ischemic cerebrovascular accident.  PAST MEDICAL HISTORY:   Past Medical History:  Diagnosis Date  . Cervicalgia   . Depression   . Essential hypertension   . Hemorrhoids   . History of MRSA infection   . Hyperlipidemia   . Hypertension   . Hypothyroidism   . IBS (irritable bowel syndrome)   . Osteoarthritis of multiple joints   . Restless leg   . Senile osteoporosis   . Shingles   . Sleep apnea   . Ulcerative colitis ([place])   . Urge incontinence     PAST SURGICAL HISTORY:  Past Surgical History:  Procedure Laterality Date  . ABDOMINAL HYSTERECTOMY  1980  . cataract surgery    . CHOLECYSTECTOMY  1955  . colon polyp removal  12/2012  . ESOPHAGOGASTRODUODENOSCOPY  2014  . makoplasty Left 06/2012  . parotid gland removal  1984    SOCIAL HISTORY:  Social History   Tobacco Use  . Smoking status: Former Research scientist (life sciences)  . Smokeless tobacco: Former Systems developer  . Tobacco comment: quit at age 27  Substance Use Topics  . Alcohol use: No    Alcohol/week: 0.0 oz    Comment: occasional    FAMILY  HISTORY:  Family History  Problem Relation Age of Onset  . Breast cancer Daughter   . Colon cancer Maternal Grandfather   . Hypertension Father   . Dementia Father   . Arthritis Mother   . Osteoporosis Mother   . Kidney disease Neg Hx   . Bladder Cancer Neg Hx     DRUG ALLERGIES:  Allergies  Allergen Reactions  . Codeine Sulfate Hives and Itching    Can take tramadol  . Codeine     REVIEW OF SYSTEMS:  Poor historian given aphasia  MEDICATIONS AT HOME:  Prior to Admission medications   Medication Sig Start Date End Date Taking? Authorizing Provider  amLODipine (NORVASC) 10 MG tablet Take 1 tablet (10 mg total) by mouth daily. Patient not taking: Reported on 09/22/2016 03/06/16   Epifanio Lesches, MD  Calcium Carbonate-Vitamin D (CALCIUM 500 + D) 500-125 MG-UNIT TABS Take 1 tablet by mouth daily.     [provider]  cyanocobalamin 1000 MCG tablet Take 1,000 mcg by mouth daily.    [provider]  DULoxetine (CYMBALTA) 30 MG capsule Take 30 mg by mouth daily.    [provider]  etodolac (LODINE) 400 MG tablet Take 400 mg by mouth 2 (two) times daily. 09/07/14   [provider]  fluticasone (FLONASE) 50 MCG/ACT nasal spray Place into the nose. 07/21/15   [provider]  levothyroxine (SYNTHROID, LEVOTHROID) 50 MCG tablet Take 50 mcg by mouth daily before breakfast.    [provider]  losartan (COZAAR) 50 MG tablet Take 50 mg by mouth 2 (two) times daily.    [provider]  metoCLOPramide (REGLAN) 10 MG tablet Take 1 tablet (10 mg total) by mouth every 8 (eight) hours as needed. 12/10/16   Loney Hering, MD  metoprolol tartrate (LOPRESSOR) 25 MG tablet Take by mouth. 04/13/16 04/13/17  [provider]  Multiple Vitamins-Minerals (MULTIVITAMIN ADULT PO) Take by mouth daily.    [provider]  omeprazole (PRILOSEC) 20 MG capsule Take 20 mg by mouth daily.    [provider]  ondansetron  (ZOFRAN ODT) 4 MG disintegrating tablet Take 1 tablet (4 mg total) by mouth every 8 (eight) hours as needed for nausea or vomiting. 11/11/16   Harvest Dark, MD  rOPINIRole (REQUIP) 0.25 MG tablet Take 0.5 mg by mouth at bedtime.     [provider]  simvastatin (ZOCOR) 20 MG tablet Take 20 mg by mouth daily.    [provider]  VESICARE 5 MG tablet TAKE 1 TABLET BY MOUTH ONCE A DAY 02/15/17   McGowan, Larene Beach A, PA-C  vitamin E 400 UNIT capsule Take 400 Units by mouth daily.    [provider]      PHYSICAL EXAMINATION:   VITAL SIGNS: Blood pressure (!) 155/78, pulse 88, temperature (!) 97.5 F (36.4 C), temperature source Oral, resp. rate 20, height 5' (1.524 m), weight 61.2 kg (135 lb), SpO2 95 %.  GENERAL:  83 y.o.-year-old patient lying in the bed with no acute distress.  Frail-appearing EYES: Pupils equal, round, reactive to light and accommodation. No scleral icterus. Extraocular muscles intact.  HEENT: Head atraumatic, normocephalic. Oropharynx and nasopharynx clear.  NECK:  Supple, no jugular venous distention. No thyroid enlargement, no tenderness.  LUNGS: Normal breath sounds bilaterally, no wheezing, rales,rhonchi or crepitation. No use of accessory muscles of respiration.  CARDIOVASCULAR: S1, S2 normal. No murmurs, rubs, or gallops.  ABDOMEN: Soft, nontender, nondistended. Bowel sounds present. No organomegaly or mass.  EXTREMITIES: No pedal edema, cyanosis, or clubbing.  NEUROLOGIC: PEERL, noted aphasia with broken speech, intermittently following commands, confused/disoriented, mild right pronator drift.  Gait not checked.  PSYCHIATRIC: Awake, alert, confused, disoriented SKIN: No obvious rash, lesion, or ulcer.   LABORATORY PANEL:   CBC Recent Labs  Lab 04/03/17 1319  WBC 10.0  HGB 14.0  HCT 42.5  PLT 151  MCV 89.4  MCH 29.3  MCHC 32.8  RDW 14.7*  LYMPHSABS 0.6*  MONOABS 0.2  EOSABS 0.0  BASOSABS 0.0    ------------------------------------------------------------------------------------------------------------------  Chemistries  Recent Labs  Lab 04/03/17 1319  NA 137  K 4.5  CL 102  CO2 25  GLUCOSE 150*  BUN 24*  CREATININE 1.45*  CALCIUM 9.6  AST 25  ALT 12*  ALKPHOS 51  BILITOT 1.0   ------------------------------------------------------------------------------------------------------------------ estimated creatinine clearance is 22.4 mL/min (A) (by C-G formula based on SCr of 1.45 mg/dL (H)). ------------------------------------------------------------------------------------------------------------------ No results for input(s): TSH, T4TOTAL, T3FREE, THYROIDAB in the last 72 hours.  Invalid input(s): FREET3   Coagulation profile No results for input(s): INR, PROTIME in the last 168 hours. ------------------------------------------------------------------------------------------------------------------- No results for input(s): DDIMER in the last 72 hours. -------------------------------------------------------------------------------------------------------------------  Cardiac Enzymes No results for input(s): CKMB, TROPONINI, MYOGLOBIN in the last 168 hours.  Invalid input(s): CK ------------------------------------------------------------------------------------------------------------------ Invalid input(s): POCBNP  ---------------------------------------------------------------------------------------------------------------  Urinalysis    Component Value Date/Time  COLORURINE YELLOW (A) 04/03/2017 1320   APPEARANCEUR CLEAR (A) 04/03/2017 1320   APPEARANCEUR Cloudy (A) 01/20/2016 1623   LABSPEC 1.014 04/03/2017 1320   LABSPEC 1.004 03/05/2013 1643   PHURINE 6.0 04/03/2017 1320   GLUCOSEU NEGATIVE 04/03/2017 1320   GLUCOSEU Negative 03/05/2013 1643   HGBUR NEGATIVE 04/03/2017 1320   BILIRUBINUR NEGATIVE 04/03/2017 1320   BILIRUBINUR Positive (A)  01/20/2016 1623   BILIRUBINUR Negative 03/05/2013 1643   KETONESUR NEGATIVE 04/03/2017 1320   PROTEINUR NEGATIVE 04/03/2017 1320   NITRITE NEGATIVE 04/03/2017 1320   LEUKOCYTESUR NEGATIVE 04/03/2017 1320   LEUKOCYTESUR 3+ (A) 01/20/2016 1623   LEUKOCYTESUR Negative 03/05/2013 1643     RADIOLOGY: Ct Head Wo Contrast  Result Date: 04/03/2017 CLINICAL DATA:  Mental status changes after a fall. EXAM: CT HEAD WITHOUT CONTRAST TECHNIQUE: Contiguous axial images were obtained from the base of the skull through the vertex without intravenous contrast. COMPARISON:  03/16/2017 FINDINGS: Brain: There is no evidence for acute hemorrhage, hydrocephalus, mass lesion, or abnormal extra-axial fluid collection. No definite CT evidence for acute infarction. Diffuse loss of parenchymal volume is consistent with atrophy. Patchy low attenuation in the deep hemispheric and periventricular white matter is nonspecific, but likely reflects chronic microvascular ischemic demyelination. Vascular: No hyperdense vessel or unexpected calcification. Skull: No evidence for fracture. No worrisome lytic or sclerotic lesion. Sinuses/Orbits: The visualized paranasal sinuses and mastoid air cells are clear. Visualized portions of the globes and intraorbital fat are unremarkable. Other: None. IMPRESSION: 1. No acute intracranial abnormality. 2. Atrophy with chronic small vessel white matter ischemic disease. Electronically Signed   By: Misty Stanley M.D.   On: 04/03/2017 13:53    EKG: Orders placed or performed during the hospital encounter of 04/03/17  . EKG 12-Lead  . EKG 12-Lead  . EKG 12-Lead  . EKG 12-Lead    IMPRESSION AND PLAN: 1 acute probable left ischemic cerebrovascular accident Admit to regular nursing floor bed on our CVA protocol, consult neurology for expert opinion, aspirin, statin therapy-check lipids in the morning, check MRA of head/neck, check MRI of the brain, echocardiogram, rule out acute coronary  syndrome with cardiac enzymes x3 sets, neuro checks per routine, aspiration/fall precautions, PT/OT/speech therapy to evaluate/treat, and case management for disposition planning as patient may require rehab status post discharge  2 acute kidney injury Baseline creatinine is normal IV fluids for rehydration and avoid nephrotoxic agents  3 history of recurrent falls/chronic ataxia Fall precautions  4 chronic hypothyroidism Complete MAR when available  5 incomplete MAR Complete when available  Full code Condition stable Prognosis fair DVT prophylaxis with heparin subcu Disposition to rehab versus independent living facility in 2-3 days barring any complications  All the records are reviewed and case discussed with ED provider. Management plans discussed with the patient, family and they are in agreement.  CODE STATUS: Code Status History    Date Active Date Inactive Code Status Order ID Comments User Context   03/03/2016 23:34 03/06/2016 16:14 DNR [id number]  Lance Coon, MD Inpatient   03/03/2016 20:44 03/03/2016 23:34 Full Code [id number]  Baxter Hire, MD ED    Questions for Most Recent Historical Code Status (Order [id number])    Question Answer Comment   In the event of cardiac or respiratory ARREST Do not call a "code blue"    In the event of cardiac or respiratory ARREST Do not perform Intubation, CPR, defibrillation or ACLS    In the event of cardiac or respiratory ARREST Use medication by any  route, position, wound care, and other measures to relive pain and suffering. May use oxygen, suction and manual treatment of airway obstruction as needed for comfort.         Advance Directive Documentation     Most Recent Value  Type of Advance Directive  Out of facility DNR (pink MOST or yellow form)  Pre-existing out of facility DNR order (yellow form or pink MOST form)  No data  "MOST" Form in Place?  No data       TOTAL TIME TAKING CARE OF THIS PATIENT: 45 minutes.     Avel Peace  M.D on 04/03/2017   Between 7am to 6pm - Pager - [phone]  After 6pm go to [email or web address] - password [email or web address] [facility]  Office  [phone]  CC: Primary care physician; Maryland Pink, MD   Note: This dictation was prepared with Dragon dictation along with smaller phrase technology. Any transcriptional errors that result from this process are unintentional.

## 2017-04-03 NOTE — ED Notes (Signed)
Pt went to MRI.

## 2017-04-03 NOTE — ED Notes (Signed)
Patient transported to CT 

## 2017-04-03 NOTE — ED Triage Notes (Signed)
From [facility] independent apartments.  EMS called out for fall, patient found to be on the floor of the bathroom.  Nurse found patient on floor of bathroom at noon today.  Staff report that patient mental status is decreased.  Patient baseline, patient able to ambulate with walker and is AAOx3.  Today patient Awake and alert to person only and unable to ambualte.  Stroke screen negative by EMS.  Last knwon well 04/02/17 at 1500.

## 2017-04-03 NOTE — Progress Notes (Signed)
Said nurse was told by day charge nurse that pt had been approved over 2 hours with no report being called.  Charge nurse from day shift called charge nurse in ED to verify if swallow screen and NIH had been done.  They had not.  Once completed, they were to call with report.  During shift change ED tried to call report, and I am returning that call right now as of 1932.

## 2017-04-03 NOTE — ED Provider Notes (Signed)
[facility] Emergency Department Provider Note    First MD Initiated Contact with Patient 04/03/17 1308     (approximate)  I have reviewed the triage vital signs and the nursing notes.  Level V caveat: Altered mental status HISTORY  Chief Complaint Fall and Altered Mental Status    HPI Maniya Mccamy is a 83 y.o. female with pillows of chronic medical conditions presents to the emergency department via EMS [facility] with altered mental status. Per EMS patient states that she went to the bathroom and had an accidental fall. EMS states that the patient said that she was on the floor for a while because she was unable to get up and when the nursing staff checked on the patient is found her in that state. Patient presents to the emergency department confused alert but oriented 0.  Past Medical History:  Diagnosis Date  . Cervicalgia   . Depression   . Essential hypertension   . Hemorrhoids   . History of MRSA infection   . Hyperlipidemia   . Hypertension   . Hypothyroidism   . IBS (irritable bowel syndrome)   . Osteoarthritis of multiple joints   . Restless leg   . Senile osteoporosis   . Shingles   . Sleep apnea   . Ulcerative colitis ([place])   . Urge incontinence     Patient Active Problem List   Diagnosis Date Noted  . Syncope 03/03/2016  . Pain in shoulder 11/25/2014  . Impingement syndrome of shoulder 11/25/2014  . Bilirubin in urine 10/04/2014  . Recurrent UTI 09/28/2014  . Urge incontinence 09/28/2014  . Microscopic hematuria 09/28/2014  . Cervical pain 09/28/2014  . Degenerative joint disease involving multiple joints 09/28/2014  . Involutional osteoporosis 09/28/2014  . Adult hypothyroidism 09/28/2014  . Acute low back pain 08/26/2014  . Arthralgia of hip 08/26/2014  . Combined fat and carbohydrate induced hyperlipemia 01/23/2014  . BP (high blood pressure) 01/23/2014  . Apnea, sleep 01/23/2014     Past Surgical History:  Procedure Laterality Date  . ABDOMINAL HYSTERECTOMY  1980  . cataract surgery    . CHOLECYSTECTOMY  1955  . colon polyp removal  12/2012  . ESOPHAGOGASTRODUODENOSCOPY  2014  . makoplasty Left 06/2012  . parotid gland removal  1984    Prior to Admission medications   Medication Sig Start Date End Date Taking? Authorizing Provider  amLODipine (NORVASC) 10 MG tablet Take 1 tablet (10 mg total) by mouth daily. Patient not taking: Reported on 09/22/2016 03/06/16   Epifanio Lesches, MD  Calcium Carbonate-Vitamin D (CALCIUM 500 + D) 500-125 MG-UNIT TABS Take 1 tablet by mouth daily.     [provider]  cyanocobalamin 1000 MCG tablet Take 1,000 mcg by mouth daily.    [provider]  DULoxetine (CYMBALTA) 30 MG capsule Take 30 mg by mouth daily.    [provider]  etodolac (LODINE) 400 MG tablet Take 400 mg by mouth 2 (two) times daily. 09/07/14   [provider]  fluticasone (FLONASE) 50 MCG/ACT nasal spray Place into the nose. 07/21/15   [provider]  levothyroxine (SYNTHROID, LEVOTHROID) 50 MCG tablet Take 50 mcg by mouth daily before breakfast.    [provider]  losartan (COZAAR) 50 MG tablet Take 50 mg by mouth 2 (two) times daily.    [provider]  metoCLOPramide (REGLAN) 10 MG tablet Take 1 tablet (10 mg total) by mouth every 8 (eight) hours as  needed. 12/10/16   Loney Hering, MD  metoprolol tartrate (LOPRESSOR) 25 MG tablet Take by mouth. 04/13/16 04/13/17  [provider]  Multiple Vitamins-Minerals (MULTIVITAMIN ADULT PO) Take by mouth daily.    [provider]  omeprazole (PRILOSEC) 20 MG capsule Take 20 mg by mouth daily.    [provider]  ondansetron (ZOFRAN ODT) 4 MG disintegrating tablet Take 1 tablet (4 mg total) by mouth every 8 (eight) hours as needed for nausea or vomiting. 11/11/16   Harvest Dark, MD  rOPINIRole (REQUIP) 0.25 MG tablet Take  0.5 mg by mouth at bedtime.     [provider]  simvastatin (ZOCOR) 20 MG tablet Take 20 mg by mouth daily.    [provider]  VESICARE 5 MG tablet TAKE 1 TABLET BY MOUTH ONCE A DAY 02/15/17   McGowan, Larene Beach A, PA-C  vitamin E 400 UNIT capsule Take 400 Units by mouth daily.    [provider]    Allergies Codeine sulfate and Codeine  Family History  Problem Relation Age of Onset  . Breast cancer Daughter   . Colon cancer Maternal Grandfather   . Hypertension Father   . Dementia Father   . Arthritis Mother   . Osteoporosis Mother   . Kidney disease Neg Hx   . Bladder Cancer Neg Hx     Social History Social History   Tobacco Use  . Smoking status: Former Research scientist (life sciences)  . Smokeless tobacco: Former Systems developer  . Tobacco comment: quit at age 4  Substance Use Topics  . Alcohol use: No    Alcohol/week: 0.0 oz    Comment: occasional  . Drug use: No    Review of Systems Constitutional: No fever/chills Eyes: No visual changes. ENT: No sore throat. Cardiovascular: Denies chest pain. Respiratory: Denies shortness of breath. Gastrointestinal: No abdominal pain.  No nausea, no vomiting.  No diarrhea.  No constipation. Genitourinary: Negative for dysuria. Musculoskeletal: Negative for neck pain.  Negative for back pain. Integumentary: Negative for rash. Neurological: Negative for headaches, focal weakness or numbness.   ____________________________________________   PHYSICAL EXAM:  VITAL SIGNS: ED Triage Vitals  Enc Vitals Group     BP 04/03/17 1312 (!) 148/90     Pulse Rate 04/03/17 1312 89     Resp 04/03/17 1312 18     Temp 04/03/17 1312 (!) 97.5 F (36.4 C)     Temp Source 04/03/17 1312 Oral     SpO2 04/03/17 1312 94 %     Weight 04/03/17 1310 61.2 kg (135 lb)     Height 04/03/17 1310 1.524 m (5')     Head Circumference --      Peak Flow --      Pain Score --      Pain Loc --      Pain Edu? --      Excl. in [place]? --     Constitutional:  Confused alert and oriented 0. Well appearing and in no acute distress. Eyes: Conjunctivae are normal. PERRL. EOMI. Head: Atraumatic. Mouth/Throat: Mucous membranes are moist.  Oropharynx non-erythematous. Neck: No stridor. Cardiovascular: Normal rate, regular rhythm. Good peripheral circulation. Grossly normal heart sounds. Respiratory: Normal respiratory effort.  No retractions. Lungs CTAB. Gastrointestinal: Soft and nontender. No distention.  Musculoskeletal: No lower extremity tenderness nor edema. No gross deformities of extremities. Neurologic: No gross focal neurologic deficits are appreciated.  Skin:  Skin is warm, dry and intact. No rash noted. Psychiatric: Mood and affect are normal. Speech  and behavior are normal.  ____________________________________________   LABS (all labs ordered are listed, but only abnormal results are displayed)  Labs Reviewed  CBC WITH DIFFERENTIAL/PLATELET - Abnormal; Notable for the following components:      Result Value   RDW 14.7 (*)    Neutro Abs 9.1 (*)    Lymphs Abs 0.6 (*)    All other components within normal limits  COMPREHENSIVE METABOLIC PANEL - Abnormal; Notable for the following components:   Glucose, Bld 150 (*)    BUN 24 (*)    Creatinine, Ser 1.45 (*)    ALT 12 (*)    GFR calc non Af Amer 31 (*)    GFR calc Af Amer 36 (*)    All other components within normal limits  URINALYSIS, COMPLETE (UACMP) WITH MICROSCOPIC - Abnormal; Notable for the following components:   Color, Urine YELLOW (*)    APPearance CLEAR (*)    All other components within normal limits  AMMONIA  CK  CBG MONITORING, ED   ____________________________________________  EKG  ED ECG REPORT I, Scobey N Joydan Gretzinger, the attending physician, personally viewed and interpreted this ECG.   Date: 04/03/2017  EKG Time: 1:11 PM  Rate: 89  Rhythm: Normal sinus rhythm  Axis: Normal  Intervals: Normal  ST&T Change:  None  ____________________________________________  RADIOLOGY I, Tch[place] N Saahil Herbster, personally viewed and evaluated these images (plain radiographs) as part of my medical decision making, as well as reviewing the written report by the radiologist.  Ct Head Wo Contrast  Result Date: 04/03/2017 CLINICAL DATA:  Mental status changes after a fall. EXAM: CT HEAD WITHOUT CONTRAST TECHNIQUE: Contiguous axial images were obtained from the base of the skull through the vertex without intravenous contrast. COMPARISON:  03/16/2017 FINDINGS: Brain: There is no evidence for acute hemorrhage, hydrocephalus, mass lesion, or abnormal extra-axial fluid collection. No definite CT evidence for acute infarction. Diffuse loss of parenchymal volume is consistent with atrophy. Patchy low attenuation in the deep hemispheric and periventricular white matter is nonspecific, but likely reflects chronic microvascular ischemic demyelination. Vascular: No hyperdense vessel or unexpected calcification. Skull: No evidence for fracture. No worrisome lytic or sclerotic lesion. Sinuses/Orbits: The visualized paranasal sinuses and mastoid air cells are clear. Visualized portions of the globes and intraorbital fat are unremarkable. Other: None. IMPRESSION: 1. No acute intracranial abnormality. 2. Atrophy with chronic small vessel white matter ischemic disease. Electronically Signed   By: Misty Stanley M.D.   On: 04/03/2017 13:53      Procedures   ____________________________________________   INITIAL IMPRESSION / ASSESSMENT AND PLAN / ED COURSE  As part of my medical decision making, I reviewed the following data within the electronic MEDICAL RECORD NUMBER 83 year old female presenting to the emergency department with acute confusion. No clear etiology for the patient's confusion identified thus far urinalysis unremarkable laboratory data negative CT scan of the head also normal. Patient's daughter now currently at bedside stating that  she saw her mother yesterday at 3:00 which point she was in her usual state of health alert and oriented and fully functioning. She states that she text with her mother at 73 PM at which time her mother responded appropriately. Patient still oriented 0. Plan to obtain a MRI of the brain. Patient discussed with Dr. Tammy Sours study for hospital admission for further evaluation and management of acute encephalopathy of unknown cause at this time ____________________________________________  FINAL CLINICAL IMPRESSION(S) / ED DIAGNOSES  Final diagnoses:  Acute encephalopathy  MEDICATIONS GIVEN DURING THIS VISIT:  Medications - No data to display   ED Discharge Orders    None       Note:  This document was prepared using Dragon voice recognition software and may include unintentional dictation errors.    Gregor Hams, MD 04/03/17 [phone]

## 2017-04-04 ENCOUNTER — Inpatient Hospital Stay: Payer: Medicare Other

## 2017-04-04 ENCOUNTER — Inpatient Hospital Stay (HOSPITAL_COMMUNITY): Payer: Medicare Other

## 2017-04-04 DIAGNOSIS — I639 Cerebral infarction, unspecified: Secondary | ICD-10-CM

## 2017-04-04 LAB — LIPID PANEL
CHOL/HDL RATIO: 6.3 ratio
Cholesterol: 234 mg/dL — ABNORMAL HIGH (ref 0–200)
HDL: 37 mg/dL — AB (ref >40)
LDL CALC: 157 mg/dL — AB (ref 0–99)
Triglycerides: 202 mg/dL — ABNORMAL HIGH (ref <150)
VLDL: 40 mg/dL (ref 0–40)

## 2017-04-04 LAB — BLOOD GAS, ARTERIAL
ACID-BASE EXCESS: 1.5 mmol/L (ref 0.0–2.0)
BICARBONATE: 25.9 mmol/L (ref 20.0–28.0)
FIO2: 0.21
O2 SAT: 94.3 %
PATIENT TEMPERATURE: 37
PO2 ART: 70 mmHg — AB (ref 83.0–108.0)
pCO2 arterial: 39 mmHg (ref 32.0–48.0)
pH, Arterial: 7.43 (ref 7.350–7.450)

## 2017-04-04 LAB — BASIC METABOLIC PANEL
ANION GAP: 8 (ref 5–15)
BUN: 20 mg/dL (ref 6–20)
CALCIUM: 8.8 mg/dL — AB (ref 8.9–10.3)
CO2: 23 mmol/L (ref 22–32)
Chloride: 106 mmol/L (ref 101–111)
Creatinine, Ser: 1.34 mg/dL — ABNORMAL HIGH (ref 0.44–1.00)
GFR calc Af Amer: 40 mL/min — ABNORMAL LOW (ref >60)
GFR calc non Af Amer: 35 mL/min — ABNORMAL LOW (ref >60)
GLUCOSE: 112 mg/dL — AB (ref 65–99)
Potassium: 3.8 mmol/L (ref 3.5–5.1)
Sodium: 137 mmol/L (ref 135–145)

## 2017-04-04 LAB — GLUCOSE, CAPILLARY: Glucose-Capillary: 128 mg/dL — ABNORMAL HIGH (ref 65–99)

## 2017-04-04 LAB — TROPONIN I: TROPONIN I: 0.03 ng/mL — AB (ref <0.03)

## 2017-04-04 MED ORDER — ROPINIROLE HCL 1 MG PO TABS
1.5000 mg | ORAL_TABLET | Freq: Every day | ORAL | Status: DC
  Administered 2017-04-04 – 2017-04-05 (×2): 1.5 mg via ORAL
  Filled 2017-04-04 (×2): qty 2

## 2017-04-04 MED ORDER — ATORVASTATIN CALCIUM 20 MG PO TABS
40.0000 mg | ORAL_TABLET | Freq: Every day | ORAL | Status: DC
  Administered 2017-04-04 – 2017-04-05 (×2): 40 mg via ORAL
  Filled 2017-04-04 (×2): qty 2

## 2017-04-04 MED ORDER — VITAMIN E 180 MG (400 UNIT) PO CAPS
400.0000 [IU] | ORAL_CAPSULE | Freq: Every day | ORAL | Status: DC
  Administered 2017-04-04 – 2017-04-06 (×3): 400 [IU] via ORAL
  Filled 2017-04-04 (×3): qty 1

## 2017-04-04 MED ORDER — LEVOTHYROXINE SODIUM 50 MCG PO TABS
50.0000 ug | ORAL_TABLET | Freq: Every day | ORAL | Status: DC
  Administered 2017-04-04 – 2017-04-06 (×3): 50 ug via ORAL
  Filled 2017-04-04 (×3): qty 1

## 2017-04-04 MED ORDER — DARIFENACIN HYDROBROMIDE ER 7.5 MG PO TB24
7.5000 mg | ORAL_TABLET | Freq: Every day | ORAL | Status: DC
  Administered 2017-04-04 – 2017-04-06 (×3): 7.5 mg via ORAL
  Filled 2017-04-04 (×3): qty 1

## 2017-04-04 MED ORDER — SODIUM CHLORIDE 0.9 % IV BOLUS (SEPSIS)
500.0000 mL | Freq: Once | INTRAVENOUS | Status: AC
  Administered 2017-04-04: 500 mL via INTRAVENOUS

## 2017-04-04 MED ORDER — DULOXETINE HCL 30 MG PO CPEP
30.0000 mg | ORAL_CAPSULE | Freq: Every day | ORAL | Status: DC
  Administered 2017-04-04 – 2017-04-06 (×3): 30 mg via ORAL
  Filled 2017-04-04 (×3): qty 1

## 2017-04-04 NOTE — Plan of Care (Signed)
Went to assess patient - and she wasn't rousable.  She would awaken and fall immediately to sleep.  Aphasia is back after speaking clearly at 0500. Called Dr. Verdell Carmine to inform of change.  He came to assess as soon as he got to hospital.  Ordered head CT and called to inform Dr. Doy Mince.

## 2017-04-04 NOTE — Progress Notes (Signed)
Pt uses a cpap at home per daughter.  Pt is having long periods of apnea, some up to a minute.  MD notified.  Waiting on a response.

## 2017-04-04 NOTE — Plan of Care (Signed)
  Not Progressing Education: Knowledge of General Education information will improve 04/04/2017 1610 - Not Progressing by Marga Hoots, RN Health Behavior/Discharge Planning: Ability to manage health-related needs will improve 04/04/2017 1610 - Not Progressing by Marga Hoots, RN Clinical Measurements: Ability to maintain clinical measurements within normal limits will improve 04/04/2017 1610 - Not Progressing by Marga Hoots, RN Education: Knowledge of disease or condition will improve 04/04/2017 1610 - Not Progressing by Marga Hoots, RN Knowledge of secondary prevention will improve 04/04/2017 1610 - Not Progressing by Marga Hoots, RN Knowledge of patient specific risk factors addressed and post discharge goals established will improve 04/04/2017 1610 - Not Progressing by Marga Hoots, RN Ischemic Stroke/TIA Tissue Perfusion: Complications of ischemic stroke/TIA will be minimized 04/04/2017 1610 - Not Progressing by Marga Hoots, RN

## 2017-04-04 NOTE — Clinical Social Work Note (Signed)
Clinical Social Work Assessment  Patient Details  Name: Kaitlyn Ramos MRN: [id number] Date of Birth: 1927/07/04  Date of referral:  04/04/17               Reason for consult:  Facility Placement                Permission sought to share information with:  Chartered certified accountant granted to share information::  Yes, Verbal Permission Granted  Name::      Retail buyer::   [place]   Relationship::     Contact Information:     Housing/Transportation Living arrangements for the past 2 months:  Charity fundraiser of Information:  [place], Adult Children Patient Interpreter Needed:  None Criminal Activity/Legal Involvement Pertinent to Current Situation/Hospitalization:  No - Comment as needed Significant Relationships:  Adult Children Lives with:  Self Do you feel safe going back to the place where you live?    Need for family participation in patient care:  Yes (Comment)  Care giving concerns:  Patient is an independent living resident at [facility].     Social Worker assessment / plan:  Holiday representative (CSW) reviewed chart and noted that patient is from [facility]. CSW contacted [facility] admissions coordinator at [facility] to confirm that patient is a [facility] resident. Per Seth Bake patient lives alone in independent living and can come to the SNF section at [facility] if needed.   Per chart patient is not alert and oriented so CSW contacted patient's daughter/ HPOA Kaitlyn Ramos. Per daughter she lives in [place] right down the road from patient and is patient's HPOA. Daughter requested for patient to go to [facility]. CSW made daughter aware that medicare will pay for SNF if patient has a 3 night qualifying inpatient stay at a hospital. Patient was admitted to inpatient on 04/03/17. Daughter verbalized her understanding. CSW made daughter aware that patient can go to [facility]  under private pay if patient doesn't have a 3 night stay. Daughter verbalized her understanding and is agreeable for patient to D/C to [facility]. Per daughter patient has her own c-pap machine at home that she can bring to SNF at [facility]. CSW will continue to follow and assist as needed.    Employment status:  Disabled (Comment on whether or not currently receiving Disability), Retired Forensic scientist:  Medicare PT Recommendations:  Not assessed at this time Information / Referral to community resources:  [place]  Patient/Family's Response to care:  Patient's daughter is agreeable for patient to go to [facility] for rehab.   Patient/Family's Understanding of and Emotional Response to Diagnosis, Current Treatment, and Prognosis:  Patient's daughter was very pleasant and thanked CSW for assistance.   Emotional Assessment Appearance:  Appears stated age Attitude/Demeanor/Rapport:  Unable to Assess Affect (typically observed):  Unable to Assess Orientation:  Oriented to Self, Fluctuating Orientation (Suspected and/or reported Sundowners) Alcohol / Substance use:  Not Applicable Psych involvement (Current and /or in the community):  No (Comment)  Discharge Needs  Concerns to be addressed:  Discharge Planning Concerns Readmission within the last 30 days:  No Current discharge risk:  Dependent with Mobility Barriers to Discharge:  Continued Medical Work up   [employer], Veronia Beets, LCSW 04/04/2017, 1:51 PM

## 2017-04-04 NOTE — NC FL2 (Signed)
[place] LEVEL OF CARE SCREENING TOOL     IDENTIFICATION  Patient Name: Uriel Horkey Birthdate: 10/07/1927 Sex: female Admission Date (Current Location): 04/03/2017  [place] and [place] Number:  Engineering geologist and Address:  [facility], [address] [place], [place] [address]      Provider Number: [id number]  Attending Physician Name and Address:  Henreitta Leber, MD  Relative Name and Phone Number:       Current Level of Care: Hospital Recommended Level of Care: [place] Prior Approval Number:    Date Approved/Denied:   PASRR Number: [id number])  Discharge Plan: SNF    Current Diagnoses: Patient Active Problem List   Diagnosis Date Noted  . CVA (cerebral vascular accident) ([place]) 04/03/2017  . Syncope 03/03/2016  . Pain in shoulder 11/25/2014  . Impingement syndrome of shoulder 11/25/2014  . Bilirubin in urine 10/04/2014  . Recurrent UTI 09/28/2014  . Urge incontinence 09/28/2014  . Microscopic hematuria 09/28/2014  . Cervical pain 09/28/2014  . Degenerative joint disease involving multiple joints 09/28/2014  . Involutional osteoporosis 09/28/2014  . Adult hypothyroidism 09/28/2014  . Acute low back pain 08/26/2014  . Arthralgia of hip 08/26/2014  . Combined fat and carbohydrate induced hyperlipemia 01/23/2014  . BP (high blood pressure) 01/23/2014  . Apnea, sleep 01/23/2014    Orientation RESPIRATION BLADDER Height & Weight     Self, Place  Normal Incontinent Weight: 135 lb (61.2 kg) Height:  5' (152.4 cm)  BEHAVIORAL SYMPTOMS/MOOD NEUROLOGICAL BOWEL NUTRITION STATUS      Incontinent Diet(Diet: Heart Healthy )  AMBULATORY STATUS COMMUNICATION OF NEEDS Skin   Extensive Assist Verbally Normal                       Personal Care Assistance Level of Assistance  Bathing, Feeding, Dressing Bathing Assistance: Limited assistance Feeding assistance: Independent Dressing Assistance:  Limited assistance     Functional Limitations Info  Sight, Hearing, Speech Sight Info: Adequate Hearing Info: Adequate Speech Info: Adequate    SPECIAL CARE FACTORS FREQUENCY  PT (By licensed PT), OT (By licensed OT)     PT Frequency: (5) OT Frequency: (5)            Contractures      Additional Factors Info  Code Status, Allergies Code Status Info: (DNR ) Allergies Info: (Codeine Sulfate, Codeine)           Current Medications (04/04/2017):  This is the current hospital active medication list Current Facility-Administered Medications  Medication Dose Route Frequency Provider Last Rate Last Dose  . acetaminophen (TYLENOL) tablet 650 mg  650 mg Oral Q4H PRN Salary, Montell D, MD       Or  . acetaminophen (TYLENOL) solution 650 mg  650 mg Per Tube Q4H PRN Salary, Montell D, MD       Or  . acetaminophen (TYLENOL) suppository 650 mg  650 mg Rectal Q4H PRN Salary, Montell D, MD      . aspirin tablet 325 mg  325 mg Oral Daily Salary, Montell D, MD   325 mg at 04/03/17 1743  . atorvastatin (LIPITOR) tablet 20 mg  20 mg Oral q1800 Salary, Montell D, MD   20 mg at 04/03/17 2242  . dextrose 5 % in lactated ringers infusion   Intravenous Continuous Salary, Montell D, MD 90 mL/hr at 04/04/17 0407    . heparin injection 5,000 Units  5,000 Units Subcutaneous Q8H Salary, Montell D,  MD   5,000 Units at 04/04/17 0543  . Influenza vac split quadrivalent PF (FLUZONE HIGH-DOSE) injection 0.5 mL  0.5 mL Intramuscular Tomorrow-1000 Salary, Montell D, MD      . senna-docusate (Senokot-S) tablet 1 tablet  1 tablet Oral QHS PRN Salary, Avel Peace, MD         Discharge Medications: Please see discharge summary for a list of discharge medications.  Relevant Imaging Results:  Relevant Lab Results:   Additional Information (SSN: [id number])  Larene Ascencio, Veronia Beets, LCSW

## 2017-04-04 NOTE — Progress Notes (Signed)
SLP Cancellation Note  Patient Details Name: Bentleigh Waren MRN: [id number] DOB: June 02, 1927   Cancelled treatment:       Reason Eval/Treat Not Completed: Medical issues which prohibited therapy - RN reports pt is currently poorly responsive, not appropriate for evaluation at this time. Will continue efforts to complete speech/language evaluation.  Celia B. Quentin Ore [facility], [place] Speech Language Pathologist [phone]  Shonna Chock 04/04/2017, 10:17 AM

## 2017-04-04 NOTE — Progress Notes (Signed)
[place] at [place] NAME: Kaitlyn Ramos    MR#:  [id number]  DATE OF BIRTH:  Jul 06, 1927  SUBJECTIVE:   Patient here due to altered mental status and noted to have an acute stroke. This morning patient was quite aphasic and this was changed from her mental status from yesterday. A stat CT head was obtained which was negative for any acute pathology. Patient's MRI yesterday confirmed a left-sided thalamic CVA.  REVIEW OF SYSTEMS:    Review of Systems  Constitutional: Negative for chills and fever.  HENT: Negative for congestion and tinnitus.   Eyes: Negative for blurred vision and double vision.  Respiratory: Negative for cough, shortness of breath and wheezing.   Cardiovascular: Negative for chest pain, orthopnea and PND.  Gastrointestinal: Negative for abdominal pain, diarrhea, nausea and vomiting.  Genitourinary: Negative for dysuria and hematuria.  Neurological: Negative for dizziness, sensory change and focal weakness.  All other systems reviewed and are negative.   Nutrition: Heart Healthy Tolerating Diet: Yes Tolerating PT: Await Eval.   DRUG ALLERGIES:   Allergies  Allergen Reactions  . Codeine Sulfate Hives and Itching    Can take tramadol  . Codeine     VITALS:  Blood pressure (!) 153/75, pulse 62, temperature 97.7 F (36.5 C), temperature source Oral, resp. rate 18, height 5' (1.524 m), weight 61.2 kg (135 lb), SpO2 97 %.  PHYSICAL EXAMINATION:   Physical Exam  GENERAL:  83 y.o.-year-old patient lying in bed Lethargic/Encephalopathic.  EYES: Pupils equal, round, reactive to light. No scleral icterus. Extraocular muscles intact.  HEENT: Head atraumatic, normocephalic. Oropharynx and nasopharynx clear.  NECK:  Supple, no jugular venous distention. No thyroid enlargement, no tenderness.  LUNGS: Normal breath sounds bilaterally, no wheezing, rales, rhonchi. No use of accessory muscles of respiration.  CARDIOVASCULAR:  S1, S2 normal. No murmurs, rubs, or gallops.  ABDOMEN: Soft, nontender, nondistended. Bowel sounds present. No organomegaly or mass.  EXTREMITIES: No cyanosis, clubbing or edema b/l.    NEUROLOGIC: Cranial nerves II through XII are intact. No focal Motor or sensory deficits b/l. Globally weak.   PSYCHIATRIC: The patient is alert and oriented x 1.  SKIN: No obvious rash, lesion, or ulcer.    LABORATORY PANEL:   CBC Recent Labs  Lab 04/03/17 1319  WBC 10.0  HGB 14.0  HCT 42.5  PLT 151   ------------------------------------------------------------------------------------------------------------------  Chemistries  Recent Labs  Lab 04/03/17 1319 04/04/17 1009  NA 137 137  K 4.5 3.8  CL 102 106  CO2 25 23  GLUCOSE 150* 112*  BUN 24* 20  CREATININE 1.45* 1.34*  CALCIUM 9.6 8.8*  AST 25  --   ALT 12*  --   ALKPHOS 51  --   BILITOT 1.0  --    ------------------------------------------------------------------------------------------------------------------  Cardiac Enzymes Recent Labs  Lab 04/04/17 0423  TROPONINI 0.03*   ------------------------------------------------------------------------------------------------------------------  RADIOLOGY:  Dg Chest 2 View  Result Date: 04/03/2017 CLINICAL DATA:  Weakness and shortness of breath.  CVA. EXAM: CHEST  2 VIEW COMPARISON:  Radiographs 08/07/2015, CT 12/19/2015 FINDINGS: Heart size upper normal. Lower lung volumes from prior exam. Atherosclerosis and tortuosity the of the thoracic aorta. Multiple chronic calcified pulmonary nodules consistent prior granulomatous disease. No pulmonary edema, confluent airspace disease, pleural effusion or pneumothorax. Progressive compression fracture of the midthoracic spine since 12/19/2015 CT. IMPRESSION: 1. Mild aortic tortuosity and atherosclerosis. No congestive failure or acute abnormality. 2. Progressive compression fracture of T8 from CT 12/19/2015. Electronically  Signed   By:  Jeb Levering M.D.   On: 04/03/2017 23:13   Ct Head Wo Contrast  Result Date: 04/04/2017 CLINICAL DATA:  83 year old female with left thalamic stroke. Fatigue. Subsequent encounter. EXAM: CT HEAD WITHOUT CONTRAST TECHNIQUE: Contiguous axial images were obtained from the base of the skull through the vertex without intravenous contrast. COMPARISON:  04/03/2017 MR and CT. FINDINGS: Brain: Nonhemorrhagic acute anterior left thalamic infarct of similar size to the recent MR. Remote anterior right lenticular nucleus infarct. Prominent chronic microvascular changes. No intracranial hemorrhage. Global atrophy. No intracranial mass lesion noted on this unenhanced exam. Vascular: Vascular calcifications. Skull: No acute abnormality. Sinuses/Orbits: No acute orbital abnormality. Opacification posterior left ethmoid sinus air cell. Other: Mastoid air cells and middle ear cavities are clear. IMPRESSION: Nonhemorrhagic acute anterior left thalamic infarct of similar size to the recent MR. No acute abnormality otherwise noted. Electronically Signed   By: Genia Del M.D.   On: 04/04/2017 09:48   Ct Head Wo Contrast  Result Date: 04/03/2017 CLINICAL DATA:  Mental status changes after a fall. EXAM: CT HEAD WITHOUT CONTRAST TECHNIQUE: Contiguous axial images were obtained from the base of the skull through the vertex without intravenous contrast. COMPARISON:  03/16/2017 FINDINGS: Brain: There is no evidence for acute hemorrhage, hydrocephalus, mass lesion, or abnormal extra-axial fluid collection. No definite CT evidence for acute infarction. Diffuse loss of parenchymal volume is consistent with atrophy. Patchy low attenuation in the deep hemispheric and periventricular white matter is nonspecific, but likely reflects chronic microvascular ischemic demyelination. Vascular: No hyperdense vessel or unexpected calcification. Skull: No evidence for fracture. No worrisome lytic or sclerotic lesion. Sinuses/Orbits: The  visualized paranasal sinuses and mastoid air cells are clear. Visualized portions of the globes and intraorbital fat are unremarkable. Other: None. IMPRESSION: 1. No acute intracranial abnormality. 2. Atrophy with chronic small vessel white matter ischemic disease. Electronically Signed   By: Misty Stanley M.D.   On: 04/03/2017 13:53   Mr Jeri Cos And Wo Contrast  Result Date: 04/03/2017 CLINICAL DATA:  Altered mental status after fall at care facility. Follow-up suspected LEFT stroke. History of hypertension, hyperlipidemia. EXAM: MRI HEAD WITHOUT AND WITH CONTRAST TECHNIQUE: Multiplanar, multiecho pulse sequences of the brain and surrounding structures were obtained without and with intravenous contrast. CONTRAST:  15mL MULTIHANCE GADOBENATE DIMEGLUMINE 529 MG/ML IV SOLN COMPARISON:  CT HEAD April 03, 2017 at 1334 hours and MRI of the head December 10, 2016 FINDINGS: INTRACRANIAL CONTENTS: 21 x 11 mm reduced diffusion LEFT thalamus with low ADC values. Similar scattered supra- and infratentorial chronic micro hemorrhages associated with chronic hypertension. Moderate to severe parenchymal brain volume loss. No hydrocephalus old RIGHT basal ganglia lacunar infarct with mild ex vacuo dilatation RIGHT frontal horn of the lateral ventricle. No midline shift, mass effect or masses. Patchy to confluent supratentorial white matter FLAIR T2 hyperintensities. No abnormal extra-axial fluid collections. VASCULAR: Normal major intracranial vascular flow voids present at skull base. SKULL AND UPPER CERVICAL SPINE: No abnormal sellar expansion. No suspicious calvarial bone marrow signal. Craniocervical junction maintained. SINUSES/ORBITS: The mastoid air-cells and included paranasal sinuses are well-aerated.The included ocular globes and orbital contents are non-suspicious. Status post bilateral ocular lens implants. OTHER: None. IMPRESSION: 1. Acute nonhemorrhagic LEFT thalamus infarct. 2. Moderate to severe parenchymal  brain volume loss. 3. Moderate to severe chronic small vessel ischemic disease. Old RIGHT basal ganglia lacunar infarct. Electronically Signed   By: Elon Alas M.D.   On: 04/03/2017 17:08   Mr Jodene Nam Head/brain  Wo Cm  Result Date: 04/04/2017 CLINICAL DATA:  Initial evaluation for acute stroke. EXAM: MRA HEAD WITHOUT CONTRAST TECHNIQUE: Angiographic images of the Circle of Willis were obtained using MRA technique without intravenous contrast. COMPARISON:  Prior MRI from 04/03/2017. FINDINGS: ANTERIOR CIRCULATION: Distal cervical segments of the internal carotid arteries are patent with antegrade flow. Petrous, cavernous, and supraclinoid segments patent without flow-limiting stenosis. A1 segments patent bilaterally. Grossly normal anterior communicating artery. Anterior cerebral artery is patent to their distal aspects without flow-limiting stenosis. Atheromatous regularity within knee left M1 segment with superimposed mild to moderate mid left M1 stenosis (series 104, image 9). Right M1 segment bifurcates early. Small vessel atheromatous irregularity within the distal MCA branches bilaterally. POSTERIOR CIRCULATION: Visualized vertebral arteries patent to the vertebrobasilar junction without stenosis. Partially visualized posterior inferior cerebral arteries patent bilaterally. Basilar artery mildly tortuous but widely patent to its distal aspect. Superior cerebral arteries patent proximally. Both of the posterior cerebral arteries primarily supplied via the basilar. Moderate atheromatous irregularity throughout the PCAs bilaterally, left slightly worse than right. Short-segment moderate proximal left P2 stenosis (series 100, image 10). Distal small vessel atheromatous irregularity. Distal left PCA somewhat attenuated as compared to the right. No aneurysm. IMPRESSION: 1. Negative intracranial MRA for large vessel occlusion. No severe high-grade or flow-limiting stenosis. 2. Moderate atherosclerotic changes  involving the intracranial circulation, most notable within the left M1 segment and bilateral P2 segments, left worse than right. Electronically Signed   By: Jeannine Boga M.D.   On: 04/04/2017 04:16     ASSESSMENT AND PLAN:   83 year old female with past medical history of hypertension, hypothyroidism, hyperlipidemia, IBS, osteoarthritis, restless leg syndrome, sleep apnea who presented to the hospital due to AMS and noted to have Acute CVA.   1. Acute CVA-this is the cause of patient's altered mental status and right-sided weakness. This morning patient was more aphasic and altered and underwent a urgent CT scan of the head which was negative for acute pathology.  ABG showing no hypercarbia, a bit hypotensive but improved with IV fluids.   - appreciate Neuro eval.  Cont. ASA, Statin.  - await PT, OT eval.    2.  Hyperlipidemia - cont. ATorvastatin  3. Depression-continue Cymbalta  4. Restless leg syndrome-continue Requip.  5. Hypothyroidism-continue Synthroid.   All the records are reviewed and case discussed with Care Management/Social Worker. Management plans discussed with the patient, family and they are in agreement.  CODE STATUS: DNR  DVT Prophylaxis: Hep. SQ  TOTAL TIME TAKING CARE OF THIS PATIENT: 30 minutes.   POSSIBLE D/C IN 1-2 DAYS, DEPENDING ON CLINICAL CONDITION.   Henreitta Leber M.D on 04/04/2017 at 3:54 PM  Between 7am to 6pm - Pager - [phone]  After 6pm go to [email or web address] - Proofreader  [facility] [place] [facility]  Office  [phone]  CC: Primary care physician; [place] Pink, MD

## 2017-04-04 NOTE — Progress Notes (Signed)
Pt is much more alert.  Had some PO intake at lunch.  Able to follow instructions, but is still very aphasic.  She is having some difficulty raising both legs and is having some sensory issues with her hands.

## 2017-04-04 NOTE — Progress Notes (Signed)
OT Cancellation Note  Patient Details Name: Kida Digiulio MRN: [id number] DOB: Jan 18, 1928   Cancelled Treatment:    Reason Eval/Treat Not Completed: Patient at procedure or test/ unavailable. Pt having EEG completed. Will re-attempt as pt is available.  Jeni Salles, MPH, MS, OTR/L ascom [phone] 04/04/17, 1:18 PM

## 2017-04-04 NOTE — Progress Notes (Signed)
OT Cancellation Note  Patient Details Name: Kaitlyn Ramos MRN: [id number] DOB: [birth year]   Cancelled Treatment:    Reason Eval/Treat Not Completed: Fatigue/lethargy limiting ability to participate. Consult received, chart reviewed.  Patient unable to awaken/maintain alertness for participation with session.  RN informed/aware, to call neurologist to report status.  Will hold at this time and continue efforts as appropriate.  Jeni Salles, MPH, MS, OTR/L ascom [phone] 04/04/17, 9:19 AM

## 2017-04-04 NOTE — Consult Note (Signed)
Referring Physician: Verdell Carmine    Chief Complaint: AMS  HPI: Toyia Jelinek is an 83 y.o. female who is unable to provide any history today due to AMS.  Family not available therefore all history obtained from the chart. Patient was last known to be well when she was texting her daughter in the evening on 12/16.  She told EMS patient states that she went to the bathroom and had an accidental fall. EMS states that the patient said that she was on the floor for a while because she was unable to get up and when the nursing staff checked on the patient is found her in that state. Patient presents to the ED confused.   Initial NIHSS of 4.   She seemed to improve some overnight but this morning was noted to be more lethargic.    Date last known well: Date: 04/02/2017 Time last known well: Time: 23:00 tPA Given: No: Outside time window  Past Medical History:  Diagnosis Date  . Cervicalgia   . Depression   . Essential hypertension   . Hemorrhoids   . History of MRSA infection   . Hyperlipidemia   . Hypertension   . Hypothyroidism   . IBS (irritable bowel syndrome)   . Osteoarthritis of multiple joints   . Restless leg   . Senile osteoporosis   . Shingles   . Sleep apnea   . Ulcerative colitis ([place])   . Urge incontinence     Past Surgical History:  Procedure Laterality Date  . ABDOMINAL HYSTERECTOMY  1980  . cataract surgery    . CHOLECYSTECTOMY  1955  . colon polyp removal  12/2012  . ESOPHAGOGASTRODUODENOSCOPY  2014  . makoplasty Left 06/2012  . parotid gland removal  1984    Family History  Problem Relation Age of Onset  . Breast cancer Daughter   . Colon cancer Maternal Grandfather   . Hypertension Father   . Dementia Father   . Arthritis Mother   . Osteoporosis Mother   . Kidney disease Neg Hx   . Bladder Cancer Neg Hx    Social History:  reports that she has quit smoking. She has quit using smokeless tobacco. She reports that she does not drink alcohol or use  drugs.  Allergies:  Allergies  Allergen Reactions  . Codeine Sulfate Hives and Itching    Can take tramadol  . Codeine     Medications:  I have reviewed the patient's current medications. Prior to Admission:  Medications Prior to Admission  Medication Sig Dispense Refill Last Dose  . amLODipine (NORVASC) 10 MG tablet Take 1 tablet (10 mg total) by mouth daily. (Patient not taking: Reported on 09/22/2016) 30 tablet 0 Not Taking  . Calcium Carbonate-Vitamin D (CALCIUM 500 + D) 500-125 MG-UNIT TABS Take 1 tablet by mouth daily.    Taking  . cyanocobalamin 1000 MCG tablet Take 1,000 mcg by mouth daily.   Taking  . DULoxetine (CYMBALTA) 30 MG capsule Take 30 mg by mouth daily.   Taking  . etodolac (LODINE) 400 MG tablet Take 400 mg by mouth 2 (two) times daily.  2 Taking  . fluticasone (FLONASE) 50 MCG/ACT nasal spray Place into the nose.   Taking  . levothyroxine (SYNTHROID, LEVOTHROID) 50 MCG tablet Take 50 mcg by mouth daily before breakfast.   Taking  . losartan (COZAAR) 50 MG tablet Take 50 mg by mouth 2 (two) times daily.   Taking  . metoCLOPramide (REGLAN) 10 MG tablet Take 1 tablet (  10 mg total) by mouth every 8 (eight) hours as needed. 20 tablet 0   . metoprolol tartrate (LOPRESSOR) 25 MG tablet Take by mouth.   Taking  . Multiple Vitamins-Minerals (MULTIVITAMIN ADULT PO) Take by mouth daily.   Taking  . omeprazole (PRILOSEC) 20 MG capsule Take 20 mg by mouth daily.   Taking  . ondansetron (ZOFRAN ODT) 4 MG disintegrating tablet Take 1 tablet (4 mg total) by mouth every 8 (eight) hours as needed for nausea or vomiting. 20 tablet 0   . rOPINIRole (REQUIP) 0.25 MG tablet Take 0.5 mg by mouth at bedtime.    Taking  . simvastatin (ZOCOR) 20 MG tablet Take 20 mg by mouth daily.   Not Taking  . VESICARE 5 MG tablet TAKE 1 TABLET BY MOUTH ONCE A DAY 30 tablet 3   . vitamin E 400 UNIT capsule Take 400 Units by mouth daily.   Taking   Scheduled: . aspirin  325 mg Oral Daily  .  atorvastatin  20 mg Oral q1800  . heparin  5,000 Units Subcutaneous Q8H  . Influenza vac split quadrivalent PF  0.5 mL Intramuscular Tomorrow-1000    ROS: Unable to obtain due to mental status  Physical Examination: Blood pressure (!) 127/55, pulse (!) 59, temperature 97.7 F (36.5 C), temperature source Oral, resp. rate 12, height 5' (1.524 m), weight 61.2 kg (135 lb), SpO2 94 %.  HEENT-  Normocephalic, no lesions, without obvious abnormality.  Normal external eye and conjunctiva.  Normal TM's bilaterally.  Normal auditory canals and external ears. Normal external nose, mucus membranes and septum.  Normal pharynx. Cardiovascular- S1, S2 normal, pulses palpable throughout   Lungs- chest clear, no wheezing, rales, normal symmetric air entry Abdomen- soft, non-tender; bowel sounds normal; no masses,  no organomegaly Extremities- no edema Lymph-no adenopathy palpable Musculoskeletal-no joint tenderness, deformity or swelling Skin-warm and dry, no hyperpigmentation, vitiligo, or suspicious lesions  Neurological Examination   Mental Status: Lethargic.  Requires multiple attempts to arouse and does not remain aroused.  Says some words but does not string together a sentence and dose not remain alert enough to do so.  Follows simple commands Cranial Nerves: II: Discs flat bilaterally; Blinks to bilateral confrontation, pupils equal, round, reactive to light and accommodation III,IV, VI: ptosis not present, extra-ocular motions intact bilaterally V,VII: smile symmetric, facial light touch sensation altered but patient can not tell me how VIII: hearing normal bilaterally IX,X: gag reflex present XI: bilateral shoulder shrug XII: midline tongue extension Motor: Drift noted in the RUE and RLE.  Maintains arm and leg outstretched on the left.   Sensory: Pinprick and light touch decreased in the RLE Deep Tendon Reflexes: 1+ in the upper extremities and absent in the lower  extremities Plantars: Right: upgoing   Left: upgoing Cerebellar: Unable to test due to lethargy Gait: not tested due to safety concerns    Laboratory Studies:  Basic Metabolic Panel: Recent Labs  Lab 04/03/17 1319  NA 137  K 4.5  CL 102  CO2 25  GLUCOSE 150*  BUN 24*  CREATININE 1.45*  CALCIUM 9.6    Liver Function Tests: Recent Labs  Lab 04/03/17 1319  AST 25  ALT 12*  ALKPHOS 51  BILITOT 1.0  PROT 7.6  ALBUMIN 4.3   No results for input(s): LIPASE, AMYLASE in the last 168 hours. Recent Labs  Lab 04/03/17 1320  AMMONIA 17    CBC: Recent Labs  Lab 04/03/17 1319  WBC 10.0  NEUTROABS  9.1*  HGB 14.0  HCT 42.5  MCV 89.4  PLT 151    Cardiac Enzymes: Recent Labs  Lab 04/03/17 1319 04/03/17 2233 04/04/17 0423  CKTOTAL 61  --   --   TROPONINI  --  <0.03 0.03*    BNP: Invalid input(s): POCBNP  CBG: Recent Labs  Lab 04/03/17 1315 04/04/17 1009  GLUCAP 130* 128*    Microbiology: Results for orders placed or performed in visit on 01/20/16  CULTURE, URINE COMPREHENSIVE     Status: Abnormal   Collection Time: 01/20/16  4:23 PM  Result Value Ref Range Status   Urine Culture, Comprehensive Final report (A)  Final   Organism ID, Bacteria Escherichia coli (A)  Final    Comment: Greater than 100,000 colony forming units per mL Cefazolin <=4 ug/mL Cefazolin with an MIC <=16 predicts susceptibility to the oral agents cefaclor, cefdinir, cefpodoxime, cefprozil, cefuroxime, cephalexin, and loracarbef when used for therapy of uncomplicated urinary tract infections due to E. coli, Klebsiella pneumoniae, and Proteus mirabilis.    ANTIMICROBIAL SUSCEPTIBILITY Comment  Final    Comment:       ** S = Susceptible; I = Intermediate; R = Resistant **                    P = Positive; N = Negative             MICS are expressed in micrograms per mL    Antibiotic                 RSLT#1    RSLT#2    RSLT#3    RSLT#4 Amoxicillin/Clavulanic Acid     S Ampicillin                     S Cefepime                       S Ceftriaxone                    S Cefuroxime                     S Cephalothin                    S Ciprofloxacin                  S Ertapenem                      S Gentamicin                     S Imipenem                       S Levofloxacin                   S Nitrofurantoin                 S Piperacillin                   S Tetracycline                   S Tobramycin                     S Trimethoprim/Sulfa             S  Microscopic Examination     Status: Abnormal   Collection Time: 01/20/16  4:23 PM  Result Value Ref Range Status   WBC, UA >30 (A) 0 - 5 /hpf Final   RBC, UA None seen 0 - 2 /hpf Final   Epithelial Cells (non renal) 0-10 0 - 10 /hpf Final   Renal Epithel, UA 0-10 (A) None seen /hpf Final   Bacteria, UA Few None seen/Few Final    Coagulation Studies: No results for input(s): LABPROT, INR in the last 72 hours.  Urinalysis:  Recent Labs  Lab 04/03/17 1320  COLORURINE YELLOW*  LABSPEC 1.014  PHURINE 6.0  GLUCOSEU NEGATIVE  HGBUR NEGATIVE  BILIRUBINUR NEGATIVE  KETONESUR NEGATIVE  PROTEINUR NEGATIVE  NITRITE NEGATIVE  LEUKOCYTESUR NEGATIVE    Lipid Panel:    Component Value Date/Time   CHOL 234 (H) 04/04/2017 0423   TRIG 202 (H) 04/04/2017 0423   HDL 37 (L) 04/04/2017 0423   CHOLHDL 6.3 04/04/2017 0423   VLDL 40 04/04/2017 0423   LDLCALC 157 (H) 04/04/2017 0423    HgbA1C: No results found for: HGBA1C  Urine Drug Screen:  No results found for: LABOPIA, COCAINSCRNUR, LABBENZ, AMPHETMU, THCU, LABBARB  Alcohol Level: No results for input(s): ETH in the last 168 hours.  Other results: EKG: sinus rhythm at 89 bpm.  Imaging: Dg Chest 2 View  Result Date: 04/03/2017 CLINICAL DATA:  Weakness and shortness of breath.  CVA. EXAM: CHEST  2 VIEW COMPARISON:  Radiographs 08/07/2015, CT 12/19/2015 FINDINGS: Heart size upper normal. Lower lung volumes from prior exam.  Atherosclerosis and tortuosity the of the thoracic aorta. Multiple chronic calcified pulmonary nodules consistent prior granulomatous disease. No pulmonary edema, confluent airspace disease, pleural effusion or pneumothorax. Progressive compression fracture of the midthoracic spine since 12/19/2015 CT. IMPRESSION: 1. Mild aortic tortuosity and atherosclerosis. No congestive failure or acute abnormality. 2. Progressive compression fracture of T8 from CT 12/19/2015. Electronically Signed   By: Jeb Levering M.D.   On: 04/03/2017 23:13   Ct Head Wo Contrast  Result Date: 04/04/2017 CLINICAL DATA:  83 year old female with left thalamic stroke. Fatigue. Subsequent encounter. EXAM: CT HEAD WITHOUT CONTRAST TECHNIQUE: Contiguous axial images were obtained from the base of the skull through the vertex without intravenous contrast. COMPARISON:  04/03/2017 MR and CT. FINDINGS: Brain: Nonhemorrhagic acute anterior left thalamic infarct of similar size to the recent MR. Remote anterior right lenticular nucleus infarct. Prominent chronic microvascular changes. No intracranial hemorrhage. Global atrophy. No intracranial mass lesion noted on this unenhanced exam. Vascular: Vascular calcifications. Skull: No acute abnormality. Sinuses/Orbits: No acute orbital abnormality. Opacification posterior left ethmoid sinus air cell. Other: Mastoid air cells and middle ear cavities are clear. IMPRESSION: Nonhemorrhagic acute anterior left thalamic infarct of similar size to the recent MR. No acute abnormality otherwise noted. Electronically Signed   By: Genia Del M.D.   On: 04/04/2017 09:48   Ct Head Wo Contrast  Result Date: 04/03/2017 CLINICAL DATA:  Mental status changes after a fall. EXAM: CT HEAD WITHOUT CONTRAST TECHNIQUE: Contiguous axial images were obtained from the base of the skull through the vertex without intravenous contrast. COMPARISON:  03/16/2017 FINDINGS: Brain: There is no evidence for acute hemorrhage,  hydrocephalus, mass lesion, or abnormal extra-axial fluid collection. No definite CT evidence for acute infarction. Diffuse loss of parenchymal volume is consistent with atrophy. Patchy low attenuation in the deep hemispheric and periventricular white matter is nonspecific, but likely reflects chronic microvascular ischemic demyelination. Vascular: No hyperdense vessel or unexpected  calcification. Skull: No evidence for fracture. No worrisome lytic or sclerotic lesion. Sinuses/Orbits: The visualized paranasal sinuses and mastoid air cells are clear. Visualized portions of the globes and intraorbital fat are unremarkable. Other: None. IMPRESSION: 1. No acute intracranial abnormality. 2. Atrophy with chronic small vessel white matter ischemic disease. Electronically Signed   By: Misty Stanley M.D.   On: 04/03/2017 13:53   Mr Jeri Cos And Wo Contrast  Result Date: 04/03/2017 CLINICAL DATA:  Altered mental status after fall at care facility. Follow-up suspected LEFT stroke. History of hypertension, hyperlipidemia. EXAM: MRI HEAD WITHOUT AND WITH CONTRAST TECHNIQUE: Multiplanar, multiecho pulse sequences of the brain and surrounding structures were obtained without and with intravenous contrast. CONTRAST:  32mL MULTIHANCE GADOBENATE DIMEGLUMINE 529 MG/ML IV SOLN COMPARISON:  CT HEAD April 03, 2017 at 1334 hours and MRI of the head December 10, 2016 FINDINGS: INTRACRANIAL CONTENTS: 21 x 11 mm reduced diffusion LEFT thalamus with low ADC values. Similar scattered supra- and infratentorial chronic micro hemorrhages associated with chronic hypertension. Moderate to severe parenchymal brain volume loss. No hydrocephalus old RIGHT basal ganglia lacunar infarct with mild ex vacuo dilatation RIGHT frontal horn of the lateral ventricle. No midline shift, mass effect or masses. Patchy to confluent supratentorial white matter FLAIR T2 hyperintensities. No abnormal extra-axial fluid collections. VASCULAR: Normal major  intracranial vascular flow voids present at skull base. SKULL AND UPPER CERVICAL SPINE: No abnormal sellar expansion. No suspicious calvarial bone marrow signal. Craniocervical junction maintained. SINUSES/ORBITS: The mastoid air-cells and included paranasal sinuses are well-aerated.The included ocular globes and orbital contents are non-suspicious. Status post bilateral ocular lens implants. OTHER: None. IMPRESSION: 1. Acute nonhemorrhagic LEFT thalamus infarct. 2. Moderate to severe parenchymal brain volume loss. 3. Moderate to severe chronic small vessel ischemic disease. Old RIGHT basal ganglia lacunar infarct. Electronically Signed   By: Elon Alas M.D.   On: 04/03/2017 17:08   Mr Jodene Nam Head/brain HU Cm  Result Date: 04/04/2017 CLINICAL DATA:  Initial evaluation for acute stroke. EXAM: MRA HEAD WITHOUT CONTRAST TECHNIQUE: Angiographic images of the Circle of Willis were obtained using MRA technique without intravenous contrast. COMPARISON:  Prior MRI from 04/03/2017. FINDINGS: ANTERIOR CIRCULATION: Distal cervical segments of the internal carotid arteries are patent with antegrade flow. Petrous, cavernous, and supraclinoid segments patent without flow-limiting stenosis. A1 segments patent bilaterally. Grossly normal anterior communicating artery. Anterior cerebral artery is patent to their distal aspects without flow-limiting stenosis. Atheromatous regularity within knee left M1 segment with superimposed mild to moderate mid left M1 stenosis (series 104, image 9). Right M1 segment bifurcates early. Small vessel atheromatous irregularity within the distal MCA branches bilaterally. POSTERIOR CIRCULATION: Visualized vertebral arteries patent to the vertebrobasilar junction without stenosis. Partially visualized posterior inferior cerebral arteries patent bilaterally. Basilar artery mildly tortuous but widely patent to its distal aspect. Superior cerebral arteries patent proximally. Both of the posterior  cerebral arteries primarily supplied via the basilar. Moderate atheromatous irregularity throughout the PCAs bilaterally, left slightly worse than right. Short-segment moderate proximal left P2 stenosis (series 100, image 10). Distal small vessel atheromatous irregularity. Distal left PCA somewhat attenuated as compared to the right. No aneurysm. IMPRESSION: 1. Negative intracranial MRA for large vessel occlusion. No severe high-grade or flow-limiting stenosis. 2. Moderate atherosclerotic changes involving the intracranial circulation, most notable within the left M1 segment and bilateral P2 segments, left worse than right. Electronically Signed   By: Jeannine Boga M.D.   On: 04/04/2017 04:16    Assessment: 83 y.o. female presenting with altered  mental status and right sided weakness.  MRI of the brain reviewed and shows a left thalamic acute infarct.  Likely related to small vessel disease and poorly controlled HTN.  MRA done overnight shows no LVO with some moderate atherosclerosis in the left M1 and bilateral P2 segments. LDL 157.  A1c pending.    Stroke Risk Factors - hyperlipidemia and hypertension  Plan: 1. HgbA1c pending 2. D/C fluids with glucose 3. STAT CBG 4. PT consult, OT consult, Speech consult 5. Echocardiogram 6. Carotid dopplers 7. Prophylactic therapy-ASA 300mg  rectally, daily 8. NPO until RN stroke swallow screen 9. Telemetry monitoring 10. Frequent neuro checks 11. ABG STAT.  Patient wears CPAP at night.  Would continue use while not as alert.   12. Liberalize BP.  Would give 500cc bolus of NS now and repeat if necessary to elevate BP 13. Trendelenberg 14. Repeat BMET 15. Repeat head CT STAT    Alexis Goodell, MD Neurology [phone] 04/04/2017, 10:37 AM

## 2017-04-04 NOTE — Clinical Social Work Placement (Signed)
   CLINICAL SOCIAL WORK PLACEMENT  NOTE  Date:  04/04/2017  Patient Details  Name: Jolyne Laye MRN: [id number] Date of Birth: 1927/05/10  Clinical Social Work is seeking post-discharge placement for this patient at the [place] level of care (*CSW will initial, date and re-position this form in  chart as items are completed):  Yes   Patient/family provided with [facility] Department's list of facilities offering this level of care within the geographic area requested by the patient (or if unable, by the patient's family).  Yes   Patient/family informed of their freedom to choose among providers that offer the needed level of care, that participate in Medicare, Medicaid or managed care program needed by the patient, have an available bed and are willing to accept the patient.  Yes   Patient/family informed of [facility]'s ownership interest in [facility] and [facility], as well as of the fact that they are under no obligation to receive care at these facilities.  PASRR submitted to EDS on       PASRR number received on       Existing PASRR number confirmed on 04/04/17     FL2 transmitted to all facilities in geographic area requested by pt/family on 04/04/17     FL2 transmitted to all facilities within larger geographic area on       Patient informed that his/her managed care company has contracts with or will negotiate with certain facilities, including the following:        Yes   Patient/family informed of bed offers received.  Patient chooses bed at [facility] )     Physician recommends and patient chooses bed at      Patient to be transferred to   on  .  Patient to be transferred to facility by       Patient family notified on   of transfer.  Name of family member notified:        PHYSICIAN       Additional Comment:    _______________________________________________ Ladonne Sharples, Veronia Beets, LCSW 04/04/2017,  2:01 PM

## 2017-04-04 NOTE — Progress Notes (Signed)
PT Cancellation Note  Patient Details Name: Kaitlyn Ramos MRN: [id number] DOB: [birth year]   Cancelled Treatment:    Reason Eval/Treat Not Completed: Fatigue/lethargy limiting ability to participate(Consult received and chart reviewed.  Patient sleeping soundly; unable to awaken/maintain alertness for participation with session.  RN informed/aware, to call neurologist to report status.  Will hold at this time and continue efforts as appropriate.)   Elham Fini H. Owens Shark, PT, DPT, NCS 04/04/17, 8:53 AM [phone]

## 2017-04-05 ENCOUNTER — Inpatient Hospital Stay
Admit: 2017-04-05 | Discharge: 2017-04-05 | Disposition: A | Discharge status: Home / Self Care | Payer: Medicare Other | Attending: Family Medicine | Admitting: Family Medicine

## 2017-04-05 DIAGNOSIS — G9340 Encephalopathy, unspecified: Secondary | ICD-10-CM

## 2017-04-05 DIAGNOSIS — I63532 Cerebral infarction due to unspecified occlusion or stenosis of left posterior cerebral artery: Secondary | ICD-10-CM

## 2017-04-05 LAB — ECHOCARDIOGRAM COMPLETE
HEIGHTINCHES: 60 in
WEIGHTICAEL: 2160 [oz_av]

## 2017-04-05 LAB — HEMOGLOBIN A1C
Hgb A1c MFr Bld: 5.8 % — ABNORMAL HIGH (ref 4.8–5.6)
Mean Plasma Glucose: 120 mg/dL

## 2017-04-05 MED ORDER — LOSARTAN POTASSIUM 50 MG PO TABS
50.0000 mg | ORAL_TABLET | Freq: Every day | ORAL | Status: DC
  Administered 2017-04-05 – 2017-04-06 (×2): 50 mg via ORAL
  Filled 2017-04-05 (×2): qty 1

## 2017-04-05 MED ORDER — METOPROLOL TARTRATE 25 MG PO TABS
12.5000 mg | ORAL_TABLET | Freq: Two times a day (BID) | ORAL | Status: DC
  Administered 2017-04-05 – 2017-04-06 (×3): 12.5 mg via ORAL
  Filled 2017-04-05 (×4): qty 1

## 2017-04-05 MED ORDER — ASPIRIN EC 81 MG PO TBEC
81.0000 mg | DELAYED_RELEASE_TABLET | Freq: Every day | ORAL | Status: DC
  Administered 2017-04-05 – 2017-04-06 (×2): 81 mg via ORAL
  Filled 2017-04-05 (×3): qty 1

## 2017-04-05 MED ORDER — CLOPIDOGREL BISULFATE 75 MG PO TABS
75.0000 mg | ORAL_TABLET | Freq: Every day | ORAL | Status: DC
  Administered 2017-04-05 – 2017-04-06 (×2): 75 mg via ORAL
  Filled 2017-04-05 (×3): qty 1

## 2017-04-05 NOTE — Progress Notes (Signed)
OT Cancellation Note  Patient Details Name: [name] MRN: [id number] DOB: [birth year]   Cancelled Treatment:    Reason Eval/Treat Not Completed: Other (comment). Pt working with SLP upon attempt this morning. Will re-attempt at later time as pt is available.  Jeni Salles, MPH, MS, OTR/L ascom [phone] 04/05/17, 9:58 AM

## 2017-04-05 NOTE — Evaluation (Signed)
Physical Therapy Evaluation Patient Details Name: Kaitlyn Ramos MRN: [id number] DOB: May 14, 1927 Today's Date: 04/05/2017   History of Present Illness  Pt is an 83 y.o. female presenting to hospital after being found on floor in bathroom; imaging showing acute nonhemorrhagic L thalamus infarct (R sided weakness, aphasic).  PMH includes compression fx T8, cervicalgia, IBS, shingles.  Clinical Impression  Prior to hospital admission, pt was modified independent ambulating with rollator.  Pt lives at [place].  Currently pt is mod assist supine to sit; mod assist to stand and transfer to [facility] or chair; and min to mod assist x1 (plus 2nd assist for safety) ambulating 12 feet with RW.  Pt able to state her last name but not first name or DOB during session.  Expressive aphasia noted during session.  Pt inconsistent with following 1 step commands but overall did well participating during session and demonstrated good activity tolerance.  Posterior lean noted during standing activities.  Pt requiring vc's, demo, hand over hand, tactile cues, and increased time for session's activities.  Difficulty assessing strength, sensation, coordination, and proprioception during session d/t impaired ability to follow commands and expressive aphasia but R LE appearing weaker compared to L LE (no knee buckling noted during session).  Pt would benefit from skilled PT to address noted impairments and functional limitations (see below for any additional details).  Upon hospital discharge, recommend pt discharge to inpatient rehab.    Follow Up Recommendations CIR    Equipment Recommendations  Rolling walker with 5" wheels    Recommendations for Other Services OT consult     Precautions / Restrictions Precautions Precautions: Fall Precaution Comments: Aspiration Restrictions Weight Bearing Restrictions: No      Mobility  Bed Mobility Overal bed mobility: Needs Assistance Bed Mobility:  Supine to Sit     Supine to sit: Mod assist;HOB elevated     General bed mobility comments: assist for trunk and scooting to edge of bed supine to sit; vc's and tactile cues for sequencing/technique required  Transfers Overall transfer level: Needs assistance Equipment used: Rolling walker (2 wheeled);None Transfers: Risk manager;Sit to/from Stand Sit to Stand: Mod assist Stand pivot transfers: Mod assist       General transfer comment: mod assist to stand with RW (posterior lean noted with very narrow BOS requiring vc's and tactile cues to correct); mod assist stand pivot bed to [facility] to recliner (posterior lean noted with transfer)  Ambulation/Gait Ambulation/Gait assistance: Min assist;Mod assist;+2 safety/equipment Ambulation Distance (Feet): 12 Feet Assistive device: Rolling walker (2 wheeled)   Gait velocity: decreased   General Gait Details: very narrow BOS, posterior lean; decreased B step length/foot clearance/heelstrike; vc's required to improve gait; also assist required for navigating RW with turns  Financial trader [employer] (Stroke Patients Only)       Balance Overall balance assessment: Needs assistance Sitting-balance support: No upper extremity supported;Feet supported Sitting balance-Leahy Scale: Good Sitting balance - Comments: steady sitting reaching within BOS   Standing balance support: No upper extremity supported Standing balance-Leahy Scale: Poor Standing balance comment: mod assist to maintain standing d/t posterior lean (when getting cleaned up and briefs donned toileting)                             Pertinent Vitals/Pain Pain Assessment: No/denies pain  Vitals (HR and O2 on room air)  stable and WFL throughout treatment session.    Home Living Family/patient expects to be discharged to:: Private residence Living Arrangements: Alone Available Help at Discharge: Personal care  attendant Type of Home: Apartment Home Access: Level entry     Home Layout: One level Home Equipment: [place] - 4 wheels Additional Comments: Pt [facility]    Prior Function Level of Independence: Needs assistance   Gait / Transfers Assistance Needed: Ambulating modified independently with 4ww.  H/o multiple falls (averages about 4 falls per month that pt's daughter knows about).  ADL's / Homemaking Assistance Needed: Homemaker 2 days/week (assist bathing, laundry, meals)-Monday for 2 hours and Thursday for 1 hour; has houskeeper every 2 weeks.        Hand Dominance        Extremity/Trunk Assessment   Upper Extremity Assessment Upper Extremity Assessment: Defer to OT evaluation    Lower Extremity Assessment Lower Extremity Assessment: RLE deficits/detail;LLE deficits/detail;Difficult to assess due to impaired cognition RLE Deficits / Details: at least 3+/5 hip flexion, 3+/5 knee flexion/extension, and DF LLE Deficits / Details: at least 4/5 L hip flexion, knee flexion/extension, and DF    Cervical / Trunk Assessment Cervical / Trunk Assessment: Normal  Communication   Communication: Expressive difficulties;Receptive difficulties  Cognition Arousal/Alertness: Awake/alert Behavior During Therapy: WFL for tasks assessed/performed Overall Cognitive Status: Impaired/Different from baseline Area of Impairment: Orientation;Following commands;Safety/judgement;Problem solving                 Orientation Level: (Able to state last name (difficult to assess d/t expressive difficulties))     Following Commands: Follows one step commands inconsistently Safety/Judgement: Decreased awareness of deficits   Problem Solving: Difficulty sequencing;Requires verbal cues;Requires tactile cues;Decreased initiation        General Comments General comments (skin integrity, edema, etc.): Pt's daughter present part of session.  Nursing cleared pt for  participation in physical therapy.  Pt and pt's daughter agreeable to PT session.    Exercises     Assessment/Plan    PT Assessment Patient needs continued PT services  PT Problem List Decreased strength;Decreased balance;Decreased mobility;Decreased cognition;Decreased knowledge of use of DME;Decreased knowledge of precautions       PT Treatment Interventions DME instruction;Gait training;Functional mobility training;Therapeutic activities;Therapeutic exercise;Balance training;Neuromuscular re-education;Patient/family education    PT Goals (Current goals can be found in the Care Plan section)  Acute Rehab PT Goals Patient Stated Goal: to improve functional mobility PT Goal Formulation: With patient/family Time For Goal Achievement: 04/19/17 Potential to Achieve Goals: Good    Frequency 7X/week   Barriers to discharge Decreased caregiver support      Co-evaluation               AM-PAC PT "6 Clicks" Daily Activity  Outcome Measure Difficulty turning over in bed (including adjusting bedclothes, sheets and blankets)?: Unable Difficulty moving from lying on back to sitting on the side of the bed? : Unable Difficulty sitting down on and standing up from a chair with arms (e.g., wheelchair, bedside commode, etc,.)?: Unable Help needed moving to and from a bed to chair (including a wheelchair)?: A Lot Help needed walking in hospital room?: A Lot Help needed climbing 3-5 steps with a railing? : Total 6 Click Score: 8    End of Session Equipment Utilized During Treatment: Gait belt Activity Tolerance: Patient tolerated treatment well Patient left: in chair;with call bell/phone within reach;with chair alarm set;with family/visitor present;Other (comment)(OT present for evaluation) Nurse Communication: Mobility status;Precautions  PT Visit Diagnosis: Other abnormalities of gait and mobility (R26.89);Repeated falls (R29.6);Muscle weakness (generalized) (M62.81);History of falling  (Z91.81);Other symptoms and signs involving the nervous system (R29.898)    Time: 1521-1600 PT Time Calculation (min) (ACUTE ONLY): 39 min   Charges:   PT Evaluation $PT Eval Low Complexity: 1 Low PT Treatments $Therapeutic Activity: 23-37 mins   PT G Codes:   PT G-Codes **NOT FOR INPATIENT CLASS** Functional Assessment Tool Used: AM-PAC 6 Clicks Basic Mobility Functional Limitation: Mobility: Walking and moving around Mobility: Walking and Moving Around Current Status (V5732): At least 80 percent but less than 100 percent impaired, limited or restricted Mobility: Walking and Moving Around Goal Status [id number]): At least 1 percent but less than 20 percent impaired, limited or restricted    Leitha Bleak, PT 04/05/17, 4:37 PM [phone]

## 2017-04-05 NOTE — Evaluation (Signed)
Occupational Therapy Evaluation Patient Details Name: Kaitlyn Ramos MRN: [id number] DOB: Sep 17, 1927 Today's Date: 04/05/2017    History of Present Illness Pt is an 83 y.o. female presenting to hospital after being found on floor in bathroom; imaging showing acute nonhemorrhagic L thalamus infarct (R sided weakness, aphasic).  PMH includes compression fx T8, cervicalgia, IBS, shingles.   Clinical Impression   Pt seen for OT evaluation this date. Pt is an independent living resident of [facility], living by herself in an apartment with hired help a couple times a week for meals, cleaning/laundry, and bathing. Pt independent with dressing and using a rollator for functional mobility. Daughter in law endorses approx 4 falls per month that she and pt have contributed primarily to dizziness. Daughter in law notes that pt has worked with several doctors and therapists in an attempt to identify cause and have been unsuccessful. Pt presents with expressive aphasia, difficulty following simple 1 step commands requiring additional time and cues to initiate movement, impaired finger to nose testing (possibly due to cognition), difficulty with visual testing (unable to visually track when requested but able to track OT around the room), globally weak but mild RUE drift noted upon testing. Pt denies sensory deficits but per chart review pt has had sensory deficits in her hand/s? Posterior lean noted with mobility efforts. Pt very eager to work with therapist, and very supportive daughter in law/other family. Difficulty fully assessing deficits due to impaired communication. Pt will benefit from skilled OT services to address noted impairments and functional deficits in order to maximize return to PLOF and minimize risk of future falls, injury, readmission, functional decline, and increased caregiver burden or higher level of care required. Recommend intensive, inpatient acute rehab upon discharge from hospital.     Follow Up Recommendations  CIR    Equipment Recommendations  Other (comment)(TBD)    Recommendations for Other Services Rehab consult     Precautions / Restrictions Precautions Precautions: Fall Precaution Comments: Aspiration Restrictions Weight Bearing Restrictions: No      Mobility Bed Mobility Overal bed mobility: Needs Assistance Bed Mobility: Supine to Sit     Supine to sit: Mod assist;HOB elevated     General bed mobility comments: deferred, pt up with PT  Transfers Overall transfer level: Needs assistance Equipment used: Rolling walker (2 wheeled);None Transfers: Sit to/from [facility] Sit to Stand: Mod assist Stand pivot transfers: Mod assist       General transfer comment: mod assist to stand with RW (posterior lean noted with very narrow BOS requiring vc's and tactile cues to correct); mod assist stand pivot bed to [facility] to recliner (posterior lean noted with transfer)    Balance Overall balance assessment: Needs assistance Sitting-balance support: No upper extremity supported;Feet supported Sitting balance-Leahy Scale: Good Sitting balance - Comments: steady sitting reaching within BOS   Standing balance support: No upper extremity supported Standing balance-Leahy Scale: Poor Standing balance comment: mod assist to maintain standing d/t posterior lean (when getting cleaned up and briefs donned toileting)                           ADL either performed or assessed with clinical judgement   ADL Overall ADL's : Needs assistance/impaired     Grooming: Sitting;Set up;Cueing for sequencing   Upper Body Bathing: Sitting;Minimal assistance   Lower Body Bathing: Sit to/from stand;Moderate assistance;Minimal assistance;Cueing for safety;Cueing for sequencing   Upper Body Dressing : Sitting;Minimal assistance  Lower Body Dressing: Sit to/from stand;Moderate assistance;Minimal assistance   Toilet Transfer:  RW;BSC;Ambulation;Cueing for safety;Cueing for sequencing;Moderate assistance   Toileting- Clothing Manipulation and Hygiene: Maximal assistance;Sit to/from stand       Functional mobility during ADLs: Minimal assistance;Moderate assistance;Rolling walker;Cueing for safety;Cueing for sequencing;+2 for safety/equipment       Vision Baseline Vision/History: No visual deficits Patient Visual Report: No change from baseline Vision Assessment?: Vision impaired- to be further tested in functional context Additional Comments: difficulty with visual assessment due to cognition? unable to visually track on command, will follow OT around the room     Perception     Praxis      Pertinent Vitals/Pain Pain Assessment: No/denies pain     Hand Dominance Right   Extremity/Trunk Assessment Upper Extremity Assessment Upper Extremity Assessment: Generalized weakness;RUE deficits/detail;Difficult to assess due to impaired cognition(LUE grossly 4-/5) RUE Deficits / Details: grossly 4-/5, denies sensory deficits, decreased initiation and coordination with finger to nose testing, difficulty sequencing thumb opposition, very slight drift noted   Lower Extremity Assessment Lower Extremity Assessment: Defer to PT evaluation RLE Deficits / Details: at least 3+/5 hip flexion, 3+/5 knee flexion/extension, and DF LLE Deficits / Details: at least 4/5 L hip flexion, knee flexion/extension, and DF   Cervical / Trunk Assessment Cervical / Trunk Assessment: Normal   Communication Communication Communication: Expressive difficulties;Receptive difficulties   Cognition Arousal/Alertness: Awake/alert Behavior During Therapy: WFL for tasks assessed/performed Overall Cognitive Status: Impaired/Different from baseline Area of Impairment: Orientation;Following commands;Safety/judgement;Problem solving                 Orientation Level: (able to state first name clearly, and part of last name, difficult to  determine orientation due to aphasia)     Following Commands: Follows one step commands inconsistently Safety/Judgement: Decreased awareness of deficits   Problem Solving: Difficulty sequencing;Requires verbal cues;Requires tactile cues;Decreased initiation     General Comments  pt's dtr present for session    Exercises Other Exercises Other Exercises: pt/dtr educated briefly on inpatient rehab versus STR, both eager to learn more from [place] expects to be discharged to:: Inpatient rehab(pt from [place]) Living Arrangements: Alone Available Help at Discharge: Personal care attendant Type of Home: Apartment Home Access: Level entry     Home Layout: One level               Home Equipment: Walker - 4 wheels   Additional Comments: Pt lives at [facility].      Prior Functioning/Environment Level of Independence: Needs assistance  Gait / Transfers Assistance Needed: Ambulating modified independently with 4ww.  H/o multiple falls (averages about 4 falls per month that pt's daughter knows about). ADL's / Homemaking Assistance Needed: Homemaker 2 days/week (assist bathing, laundry, meals)-Monday for 2 hours and Thursday for 1 hour; has housekeeper every 2 weeks.   Comments: approx 4 falls/mo, largely due to dizziness (daughter in law reports pt has been to multiple doctors/therapists for dizziness and unable to determine cause)        OT Problem List: Impaired vision/perception;Decreased strength;Decreased coordination;Decreased cognition;Decreased activity tolerance;Impaired UE functional use;Decreased safety awareness;Impaired balance (sitting and/or standing)      OT Treatment/Interventions: Self-care/ADL training;Balance training;Therapeutic exercise;Neuromuscular education;Therapeutic activities;Visual/perceptual remediation/compensation;Cognitive remediation/compensation;DME and/or AE  instruction;Patient/family education    OT Goals(Current goals can be found in the care plan section) Acute Rehab OT Goals Patient Stated Goal: to improve functional mobility OT  Goal Formulation: With patient/family Time For Goal Achievement: 04/19/17 Potential to Achieve Goals: Good  OT Frequency: Min 3X/week   Barriers to D/C:            Co-evaluation              AM-PAC PT "6 Clicks" Daily Activity     Outcome Measure Help from another person eating meals?: A Little Help from another person taking care of personal grooming?: A Little Help from another person toileting, which includes using toliet, bedpan, or urinal?: A Little Help from another person bathing (including washing, rinsing, drying)?: A Lot Help from another person to put on and taking off regular upper body clothing?: A Little Help from another person to put on and taking off regular lower body clothing?: A Lot 6 Click Score: 16   End of Session Equipment Utilized During Treatment: Gait belt;Rolling walker  Activity Tolerance: Patient tolerated treatment well Patient left: in chair;with call bell/phone within reach;with chair alarm set;with family/visitor present  OT Visit Diagnosis: Other abnormalities of gait and mobility (R26.89);Repeated falls (R29.6);Muscle weakness (generalized) (M62.81);Other symptoms and signs involving cognitive function                Time: 7972-8206 OT Time Calculation (min): 38 min Charges:  OT General Charges $OT Visit: 1 Visit OT Evaluation $OT Eval Low Complexity: 1 Low OT Treatments $Self Care/Home Management : 8-22 mins G-Codes: OT G-codes **NOT FOR INPATIENT CLASS** Functional Assessment Tool Used: AM-PAC 6 Clicks Daily Activity;Clinical judgement Functional Limitation: Self care Self Care Current Status (O1561): At least 40 percent but less than 60 percent impaired, limited or restricted Self Care Goal Status (B3794): At least 20 percent but less than 40 percent  impaired, limited or restricted   Jeni Salles, MPH, MS, OTR/L ascom [phone] 04/05/17, 5:13 PM

## 2017-04-05 NOTE — Evaluation (Signed)
Speech Language Pathology Evaluation Patient Details Name: Kaitlyn Ramos MRN: [id number] DOB: Aug 16, 1927 Today's Date: 04/05/2017 Time: 0930-1030 SLP Time Calculation (min) (ACUTE ONLY): 60 min  Problem List:  Patient Active Problem List   Diagnosis Date Noted  . Acute encephalopathy   . CVA (cerebral vascular accident) ([place]) 04/03/2017  . Syncope 03/03/2016  . Pain in shoulder 11/25/2014  . Impingement syndrome of shoulder 11/25/2014  . Bilirubin in urine 10/04/2014  . Recurrent UTI 09/28/2014  . Urge incontinence 09/28/2014  . Microscopic hematuria 09/28/2014  . Cervical pain 09/28/2014  . Degenerative joint disease involving multiple joints 09/28/2014  . Involutional osteoporosis 09/28/2014  . Adult hypothyroidism 09/28/2014  . Acute low back pain 08/26/2014  . Arthralgia of hip 08/26/2014  . Combined fat and carbohydrate induced hyperlipemia 01/23/2014  . BP (high blood pressure) 01/23/2014  . Apnea, sleep 01/23/2014   Past Medical History:  Past Medical History:  Diagnosis Date  . Cervicalgia   . Depression   . Essential hypertension   . Hemorrhoids   . History of MRSA infection   . Hyperlipidemia   . Hypertension   . Hypothyroidism   . IBS (irritable bowel syndrome)   . Osteoarthritis of multiple joints   . Restless leg   . Senile osteoporosis   . Shingles   . Sleep apnea   . Ulcerative colitis ([place])   . Urge incontinence    Past Surgical History:  Past Surgical History:  Procedure Laterality Date  . ABDOMINAL HYSTERECTOMY  1980  . cataract surgery    . CHOLECYSTECTOMY  1955  . colon polyp removal  12/2012  . ESOPHAGOGASTRODUODENOSCOPY  2014  . makoplasty Left 06/2012  . parotid gland removal  1984   HPI:  Kaitlyn Ramos  is a 83 y.o. female with a known history per below, last neurologically normal around 49 PM-was texting her daughter at the time, found this afternoon at the independent living facility by EMS after fall, noted altered mental  status, ER workup noted for creatinine 1.4 with baseline normal, CT head and cervical spine were negative, urinalysis negative, patient evaluated the bedside, daughter present, patient noted to be a phasic with right pronator drift, patient is now been admitted with probable acute left ischemic cerebrovascular accident.   Assessment / Plan / Recommendation Clinical Impression  This 83 year old woman, with acute left thalamic CVA 04/03/2017, is presenting with severe aphasia (most consistent with transcortical mixed) characterized by dysfluent output with poor information content, reduced comprehension, and impaired confrontation naming.  The patient was given the Western Aphasia Battery- Screening with a screening aphasia quotient of 32.5/100.  The patient demonstrates relative strengths in repetition and reading fluency/comprehension.  The patient will benefit from intensive speech therapy in her next setting.    SLP Assessment  SLP Recommendation/Assessment: All further Speech Lanaguage Pathology  needs can be addressed in the next venue of care SLP Visit Diagnosis: Aphasia (R47.01)    Follow Up Recommendations  Other (comment)(Speech therapy in next setting)    Frequency and Duration           SLP Evaluation Cognition  Overall Cognitive Status: Difficult to assess Arousal/Alertness: Awake/alert       Comprehension  Auditory Comprehension Overall Auditory Comprehension: Impaired Yes/No Questions: Impaired Basic Biographical Questions: 76-100% accurate Basic Immediate Environment Questions: 75-100% accurate Complex Questions: 0-24% accurate Commands: Impaired One Step Basic Commands: 75-100% accurate Complex Commands: 0-24% accurate Reading Comprehension Reading Status: Impaired Word level: Within functional limits Sentence Level: Within  functional limits Paragraph Level: Impaired Interfering Components: Right neglect/inattention    Expression Expression Primary Mode of  Expression: Verbal Verbal Expression Overall Verbal Expression: Impaired Initiation: No impairment Automatic Speech: Counting Level of Generative/Spontaneous Verbalization: Word Repetition: No impairment(Relative strength) Naming: Impairment Responsive: 0-25% accurate Confrontation: Impaired Convergent: 0-24% accurate Written Expression Written Expression: Exceptions to [facility]   Oral / Motor  Oral Motor/Sensory Function Overall Oral Motor/Sensory Function: Within functional limits Motor Speech Overall Motor Speech: Appears within functional limits for tasks assessed   GO                   Kaitlyn Sea, MS/CCC- SLP  Valetta Fuller, Susie 04/05/2017, 10:57 AM

## 2017-04-05 NOTE — Progress Notes (Signed)
*  PRELIMINARY RESULTS* Echocardiogram 2D Echocardiogram has been performed.  Sherrie Sport 04/05/2017, 3:22 PM

## 2017-04-05 NOTE — Progress Notes (Signed)
OT Cancellation Note  Patient Details Name: Kaitlyn Ramos MRN: [id number] DOB: 02/24/1928   Cancelled Treatment:    Reason Eval/Treat Not Completed: Fatigue/lethargy limiting ability to participate. On 2nd attempt to see, pt lethargic, did wake to OT's voice but unable to keep eyes open and maintain alertness despite cuing. Will re-attempt later this afternoon as pt is able to participate.   Jeni Salles, MPH, MS, OTR/L ascom [phone] 04/05/17, 1:50 PM

## 2017-04-05 NOTE — Consult Note (Signed)
Pt's strength has improved this AM. Still confused about the month/date. Information obtained from daughter.   Past Medical History:  Diagnosis Date  . Cervicalgia   . Depression   . Essential hypertension   . Hemorrhoids   . History of MRSA infection   . Hyperlipidemia   . Hypertension   . Hypothyroidism   . IBS (irritable bowel syndrome)   . Osteoarthritis of multiple joints   . Restless leg   . Senile osteoporosis   . Shingles   . Sleep apnea   . Ulcerative colitis ([place])   . Urge incontinence     Past Surgical History:  Procedure Laterality Date  . ABDOMINAL HYSTERECTOMY  1980  . cataract surgery    . CHOLECYSTECTOMY  1955  . colon polyp removal  12/2012  . ESOPHAGOGASTRODUODENOSCOPY  2014  . makoplasty Left 06/2012  . parotid gland removal  1984    Family History  Problem Relation Age of Onset  . Breast cancer Daughter   . Colon cancer Maternal Grandfather   . Hypertension Father   . Dementia Father   . Arthritis Mother   . Osteoporosis Mother   . Kidney disease Neg Hx   . Bladder Cancer Neg Hx    Social History:  reports that she has quit smoking. She has quit using smokeless tobacco. She reports that she does not drink alcohol or use drugs.  Allergies:  Allergies  Allergen Reactions  . Codeine Sulfate Hives and Itching    Can take tramadol  . Codeine     Medications:  I have reviewed the patient's current medications. Prior to Admission:  Medications Prior to Admission  Medication Sig Dispense Refill Last Dose  . Calcium Carbonate-Vitamin D (CALCIUM 500 + D) 500-125 MG-UNIT TABS Take 1 tablet by mouth daily.    04/03/2017 at Unknown time  . cyanocobalamin 1000 MCG tablet Take 1,000 mcg by mouth daily.   04/03/2017 at Unknown time  . DULoxetine (CYMBALTA) 30 MG capsule Take 30 mg by mouth daily.   04/03/2017 at Unknown time  . etodolac (LODINE) 400 MG tablet Take 400 mg by mouth 2 (two) times daily.  2 04/03/2017 at Unknown time  .  levothyroxine (SYNTHROID, LEVOTHROID) 50 MCG tablet Take 50 mcg by mouth daily before breakfast.   04/03/2017 at Unknown time  . losartan (COZAAR) 50 MG tablet Take 50 mg by mouth daily.    04/03/2017 at Unknown time  . metoprolol tartrate (LOPRESSOR) 25 MG tablet Take 25 mg by mouth 2 (two) times daily.    04/03/2017 at Unknown time  . Multiple Vitamins-Minerals (MULTIVITAMIN ADULT PO) Take by mouth daily.   04/03/2017 at Unknown time  . rOPINIRole (REQUIP) 0.5 MG tablet Take 1.5 mg by mouth at bedtime.    04/03/2017 at Unknown time  . spironolactone (ALDACTONE) 25 MG tablet Take 25 mg by mouth daily.   04/03/2017 at Unknown time  . VESICARE 5 MG tablet TAKE 1 TABLET BY MOUTH ONCE A DAY 30 tablet 3 04/03/2017 at Unknown time  . vitamin E 400 UNIT capsule Take 400 Units by mouth daily.   04/03/2017 at Unknown time  . amLODipine (NORVASC) 10 MG tablet Take 1 tablet (10 mg total) by mouth daily. (Patient not taking: Reported on 09/22/2016) 30 tablet 0 Not Taking at Unknown time  . fluticasone (FLONASE) 50 MCG/ACT nasal spray Place into the nose.   prn at prn  . metoCLOPramide (REGLAN) 10 MG tablet Take 1 tablet (10 mg total)  by mouth every 8 (eight) hours as needed. 20 tablet 0 prn at prn  . ondansetron (ZOFRAN ODT) 4 MG disintegrating tablet Take 1 tablet (4 mg total) by mouth every 8 (eight) hours as needed for nausea or vomiting. 20 tablet 0 prn at prn   Scheduled: . aspirin  325 mg Oral Daily  . atorvastatin  40 mg Oral q1800  . darifenacin  7.5 mg Oral Daily  . DULoxetine  30 mg Oral Daily  . heparin  5,000 Units Subcutaneous Q8H  . Influenza vac split quadrivalent PF  0.5 mL Intramuscular Tomorrow-1000  . levothyroxine  50 mcg Oral QAC breakfast  . rOPINIRole  1.5 mg Oral QHS  . vitamin E  400 Units Oral Daily    ROS: Unable to obtain due to mental status  Physical Examination: Blood pressure (!) 158/82, pulse 65, temperature 97.9 F (36.6 C), temperature source Oral, resp. rate 16,  height 5' (1.524 m), weight 61.2 kg (135 lb), SpO2 94 %.  HEENT-  Normocephalic, no lesions, without obvious abnormality.  Normal external eye and conjunctiva.  Normal TM's bilaterally.  Normal auditory canals and external ears. Normal external nose, mucus membranes and septum.  Normal pharynx. Cardiovascular- S1, S2 normal, pulses palpable throughout   Lungs- chest clear, no wheezing, rales, normal symmetric air entry Abdomen- soft, non-tender; bowel sounds normal; no masses,  no organomegaly Extremities- no edema Lymph-no adenopathy palpable Musculoskeletal-no joint tenderness, deformity or swelling Skin-warm and dry, no hyperpigmentation, vitiligo, or suspicious lesions  Neurological Examination   Mental Status: Lethargic.  Requires multiple attempts to arouse and does not remain aroused.  Says some words but does not string together a sentence and dose not remain alert enough to do so.  Follows simple commands Cranial Nerves: II: Discs flat bilaterally; Blinks to bilateral confrontation, pupils equal, round, reactive to light and accommodation III,IV, VI: ptosis not present, extra-ocular motions intact bilaterally V,VII: smile symmetric, facial light touch sensation altered but patient can not tell me how VIII: hearing normal bilaterally IX,X: gag reflex present XI: bilateral shoulder shrug XII: midline tongue extension Motor: Generalized weakness but mild drift on RUE Sensory: Pinprick and light touch decreased in the RLE Deep Tendon Reflexes: 1+ in the upper extremities and absent in the lower extremities Plantars: Right: upgoing   Left: upgoing Cerebellar: Not tested  Gait: not tested due to safety concerns    Laboratory Studies:  Basic Metabolic Panel: Recent Labs  Lab 04/03/17 1319 04/04/17 1009  NA 137 137  K 4.5 3.8  CL 102 106  CO2 25 23  GLUCOSE 150* 112*  BUN 24* 20  CREATININE 1.45* 1.34*  CALCIUM 9.6 8.8*    Liver Function Tests: Recent Labs  Lab  04/03/17 1319  AST 25  ALT 12*  ALKPHOS 51  BILITOT 1.0  PROT 7.6  ALBUMIN 4.3   No results for input(s): LIPASE, AMYLASE in the last 168 hours. Recent Labs  Lab 04/03/17 1320  AMMONIA 17    CBC: Recent Labs  Lab 04/03/17 1319  WBC 10.0  NEUTROABS 9.1*  HGB 14.0  HCT 42.5  MCV 89.4  PLT 151    Cardiac Enzymes: Recent Labs  Lab 04/03/17 1319 04/03/17 2233 04/04/17 0423  CKTOTAL 61  --   --   TROPONINI  --  <0.03 0.03*    BNP: Invalid input(s): POCBNP  CBG: Recent Labs  Lab 04/03/17 1315 04/04/17 1009  GLUCAP 130* 128*    Microbiology: Results for orders placed or performed  in visit on 01/20/16  CULTURE, URINE COMPREHENSIVE     Status: Abnormal   Collection Time: 01/20/16  4:23 PM  Result Value Ref Range Status   Urine Culture, Comprehensive Final report (A)  Final   Organism ID, Bacteria Escherichia coli (A)  Final    Comment: Greater than 100,000 colony forming units per mL Cefazolin <=4 ug/mL Cefazolin with an MIC <=16 predicts susceptibility to the oral agents cefaclor, cefdinir, cefpodoxime, cefprozil, cefuroxime, cephalexin, and loracarbef when used for therapy of uncomplicated urinary tract infections due to E. coli, Klebsiella pneumoniae, and Proteus mirabilis.    ANTIMICROBIAL SUSCEPTIBILITY Comment  Final    Comment:       ** S = Susceptible; I = Intermediate; R = Resistant **                    P = Positive; N = Negative             MICS are expressed in micrograms per mL    Antibiotic                 RSLT#1    RSLT#2    RSLT#3    RSLT#4 Amoxicillin/Clavulanic Acid    S Ampicillin                     S Cefepime                       S Ceftriaxone                    S Cefuroxime                     S Cephalothin                    S Ciprofloxacin                  S Ertapenem                      S Gentamicin                     S Imipenem                       S Levofloxacin                   S Nitrofurantoin                  S Piperacillin                   S Tetracycline                   S Tobramycin                     S Trimethoprim/Sulfa             S   Microscopic Examination     Status: Abnormal   Collection Time: 01/20/16  4:23 PM  Result Value Ref Range Status   WBC, UA >30 (A) 0 - 5 /hpf Final   RBC, UA None seen 0 - 2 /hpf Final   Epithelial Cells (non renal) 0-10 0 - 10 /hpf Final   Renal Epithel, UA 0-10 (A) None seen /hpf Final   Bacteria, UA Few None  seen/Few Final    Coagulation Studies: No results for input(s): LABPROT, INR in the last 72 hours.  Urinalysis:  Recent Labs  Lab 04/03/17 1320  COLORURINE YELLOW*  LABSPEC 1.014  PHURINE 6.0  GLUCOSEU NEGATIVE  HGBUR NEGATIVE  BILIRUBINUR NEGATIVE  KETONESUR NEGATIVE  PROTEINUR NEGATIVE  NITRITE NEGATIVE  LEUKOCYTESUR NEGATIVE    Lipid Panel:    Component Value Date/Time   CHOL 234 (H) 04/04/2017 0423   TRIG 202 (H) 04/04/2017 0423   HDL 37 (L) 04/04/2017 0423   CHOLHDL 6.3 04/04/2017 0423   VLDL 40 04/04/2017 0423   LDLCALC 157 (H) 04/04/2017 0423    HgbA1C:  Lab Results  Component Value Date   HGBA1C 5.8 (H) 04/04/2017    Urine Drug Screen:  No results found for: LABOPIA, COCAINSCRNUR, LABBENZ, AMPHETMU, THCU, LABBARB  Alcohol Level: No results for input(s): ETH in the last 168 hours.  Other results: EKG: sinus rhythm at 89 bpm.  Imaging: Dg Chest 2 View  Result Date: 04/03/2017 CLINICAL DATA:  Weakness and shortness of breath.  CVA. EXAM: CHEST  2 VIEW COMPARISON:  Radiographs 08/07/2015, CT 12/19/2015 FINDINGS: Heart size upper normal. Lower lung volumes from prior exam. Atherosclerosis and tortuosity the of the thoracic aorta. Multiple chronic calcified pulmonary nodules consistent prior granulomatous disease. No pulmonary edema, confluent airspace disease, pleural effusion or pneumothorax. Progressive compression fracture of the midthoracic spine since 12/19/2015 CT. IMPRESSION: 1. Mild aortic tortuosity and  atherosclerosis. No congestive failure or acute abnormality. 2. Progressive compression fracture of T8 from CT 12/19/2015. Electronically Signed   By: Jeb Levering M.D.   On: 04/03/2017 23:13   Ct Head Wo Contrast  Result Date: 04/04/2017 CLINICAL DATA:  83 year old female with left thalamic stroke. Fatigue. Subsequent encounter. EXAM: CT HEAD WITHOUT CONTRAST TECHNIQUE: Contiguous axial images were obtained from the base of the skull through the vertex without intravenous contrast. COMPARISON:  04/03/2017 MR and CT. FINDINGS: Brain: Nonhemorrhagic acute anterior left thalamic infarct of similar size to the recent MR. Remote anterior right lenticular nucleus infarct. Prominent chronic microvascular changes. No intracranial hemorrhage. Global atrophy. No intracranial mass lesion noted on this unenhanced exam. Vascular: Vascular calcifications. Skull: No acute abnormality. Sinuses/Orbits: No acute orbital abnormality. Opacification posterior left ethmoid sinus air cell. Other: Mastoid air cells and middle ear cavities are clear. IMPRESSION: Nonhemorrhagic acute anterior left thalamic infarct of similar size to the recent MR. No acute abnormality otherwise noted. Electronically Signed   By: Genia Del M.D.   On: 04/04/2017 09:48   Ct Head Wo Contrast  Result Date: 04/03/2017 CLINICAL DATA:  Mental status changes after a fall. EXAM: CT HEAD WITHOUT CONTRAST TECHNIQUE: Contiguous axial images were obtained from the base of the skull through the vertex without intravenous contrast. COMPARISON:  03/16/2017 FINDINGS: Brain: There is no evidence for acute hemorrhage, hydrocephalus, mass lesion, or abnormal extra-axial fluid collection. No definite CT evidence for acute infarction. Diffuse loss of parenchymal volume is consistent with atrophy. Patchy low attenuation in the deep hemispheric and periventricular white matter is nonspecific, but likely reflects chronic microvascular ischemic demyelination.  Vascular: No hyperdense vessel or unexpected calcification. Skull: No evidence for fracture. No worrisome lytic or sclerotic lesion. Sinuses/Orbits: The visualized paranasal sinuses and mastoid air cells are clear. Visualized portions of the globes and intraorbital fat are unremarkable. Other: None. IMPRESSION: 1. No acute intracranial abnormality. 2. Atrophy with chronic small vessel white matter ischemic disease. Electronically Signed   By: Misty Stanley M.D.   On:  04/03/2017 13:53   Mr Jeri Cos And Wo Contrast  Result Date: 04/03/2017 CLINICAL DATA:  Altered mental status after fall at care facility. Follow-up suspected LEFT stroke. History of hypertension, hyperlipidemia. EXAM: MRI HEAD WITHOUT AND WITH CONTRAST TECHNIQUE: Multiplanar, multiecho pulse sequences of the brain and surrounding structures were obtained without and with intravenous contrast. CONTRAST:  95mL MULTIHANCE GADOBENATE DIMEGLUMINE 529 MG/ML IV SOLN COMPARISON:  CT HEAD April 03, 2017 at 1334 hours and MRI of the head December 10, 2016 FINDINGS: INTRACRANIAL CONTENTS: 21 x 11 mm reduced diffusion LEFT thalamus with low ADC values. Similar scattered supra- and infratentorial chronic micro hemorrhages associated with chronic hypertension. Moderate to severe parenchymal brain volume loss. No hydrocephalus old RIGHT basal ganglia lacunar infarct with mild ex vacuo dilatation RIGHT frontal horn of the lateral ventricle. No midline shift, mass effect or masses. Patchy to confluent supratentorial white matter FLAIR T2 hyperintensities. No abnormal extra-axial fluid collections. VASCULAR: Normal major intracranial vascular flow voids present at skull base. SKULL AND UPPER CERVICAL SPINE: No abnormal sellar expansion. No suspicious calvarial bone marrow signal. Craniocervical junction maintained. SINUSES/ORBITS: The mastoid air-cells and included paranasal sinuses are well-aerated.The included ocular globes and orbital contents are non-suspicious.  Status post bilateral ocular lens implants. OTHER: None. IMPRESSION: 1. Acute nonhemorrhagic LEFT thalamus infarct. 2. Moderate to severe parenchymal brain volume loss. 3. Moderate to severe chronic small vessel ischemic disease. Old RIGHT basal ganglia lacunar infarct. Electronically Signed   By: Elon Alas M.D.   On: 04/03/2017 17:08   Mr Jodene Nam Head/brain DG Cm  Result Date: 04/04/2017 CLINICAL DATA:  Initial evaluation for acute stroke. EXAM: MRA HEAD WITHOUT CONTRAST TECHNIQUE: Angiographic images of the Circle of Willis were obtained using MRA technique without intravenous contrast. COMPARISON:  Prior MRI from 04/03/2017. FINDINGS: ANTERIOR CIRCULATION: Distal cervical segments of the internal carotid arteries are patent with antegrade flow. Petrous, cavernous, and supraclinoid segments patent without flow-limiting stenosis. A1 segments patent bilaterally. Grossly normal anterior communicating artery. Anterior cerebral artery is patent to their distal aspects without flow-limiting stenosis. Atheromatous regularity within knee left M1 segment with superimposed mild to moderate mid left M1 stenosis (series 104, image 9). Right M1 segment bifurcates early. Small vessel atheromatous irregularity within the distal MCA branches bilaterally. POSTERIOR CIRCULATION: Visualized vertebral arteries patent to the vertebrobasilar junction without stenosis. Partially visualized posterior inferior cerebral arteries patent bilaterally. Basilar artery mildly tortuous but widely patent to its distal aspect. Superior cerebral arteries patent proximally. Both of the posterior cerebral arteries primarily supplied via the basilar. Moderate atheromatous irregularity throughout the PCAs bilaterally, left slightly worse than right. Short-segment moderate proximal left P2 stenosis (series 100, image 10). Distal small vessel atheromatous irregularity. Distal left PCA somewhat attenuated as compared to the right. No aneurysm.  IMPRESSION: 1. Negative intracranial MRA for large vessel occlusion. No severe high-grade or flow-limiting stenosis. 2. Moderate atherosclerotic changes involving the intracranial circulation, most notable within the left M1 segment and bilateral P2 segments, left worse than right. Electronically Signed   By: Jeannine Boga M.D.   On: 04/04/2017 04:16    Assessment: 83 y.o. female presenting with altered mental status and right sided weakness.  MRI of the brain reviewed and shows a left thalamic acute infarct.  Likely related to small vessel disease and poorly controlled HTN.  MRA done overnight shows no LVO with some moderate atherosclerosis in the left M1 and bilateral P2 segments. LDL 157Stroke Risk   - CTH repeated yesterday with L thalamic infarct.  -  Pt started ASA 81 3 days prior to stroke - current confusion is likely delirium related more so then stroke which should improve - minimal deficits - con't pt/ot - Will decrease ASA back to 81 and start plavix 75 daily due to significant atherosclerosis - statin   Leotis Pain

## 2017-04-05 NOTE — Progress Notes (Signed)
[name] [place] NAME: Kaitlyn Ramos    MR#:  [id number]  DATE OF BIRTH:  [birth year]  SUBJECTIVE:   Patient here due to acute CVA, remains aphasic. Awaiting physical therapy evaluation. More awake and tolerating by mouth well. No other acute complaints presently on a ventilator overnight. Patient's daughter is at bedside.  REVIEW OF SYSTEMS:    Review of Systems  Constitutional: Negative for chills and fever.  HENT: Negative for congestion and tinnitus.   Eyes: Negative for blurred vision and double vision.  Respiratory: Negative for cough, shortness of breath and wheezing.   Cardiovascular: Negative for chest pain, orthopnea and PND.  Gastrointestinal: Negative for abdominal pain, diarrhea, nausea and vomiting.  Genitourinary: Negative for dysuria and hematuria.  Neurological: Negative for dizziness, sensory change and focal weakness.  All other systems reviewed and are negative.   Nutrition: Heart Healthy Tolerating Diet: Yes Tolerating PT: Await Eval.   DRUG ALLERGIES:   Allergies  Allergen Reactions  . Codeine Sulfate Hives and Itching    Can take tramadol  . Codeine     VITALS:  Blood pressure (!) 155/88, pulse 88, temperature 98 F (36.7 C), temperature source Oral, resp. rate 18, height 5' (1.524 m), weight 61.2 kg (135 lb), SpO2 95 %.  PHYSICAL EXAMINATION:   Physical Exam  GENERAL:  83 y.o.-year-old patient lying in bed awake and follows commands in NAD.  EYES: Pupils equal, round, reactive to light. No scleral icterus. Extraocular muscles intact.  HEENT: Head atraumatic, normocephalic. Oropharynx and nasopharynx clear.  NECK:  Supple, no jugular venous distention. No thyroid enlargement, no tenderness.  LUNGS: Normal breath sounds bilaterally, no wheezing, rales, rhonchi. No use of accessory muscles of respiration.  CARDIOVASCULAR: S1, S2 normal. No murmurs, rubs, or gallops.  ABDOMEN: Soft, nontender,  nondistended. Bowel sounds present. No organomegaly or mass.  EXTREMITIES: No cyanosis, clubbing or edema b/l.    NEUROLOGIC: Cranial nerves II through XII are intact. No focal Motor or sensory deficits b/l. Globally weak, aphasic PSYCHIATRIC: The patient is alert and oriented x 1.  SKIN: No obvious rash, lesion, or ulcer.    LABORATORY PANEL:   CBC Recent Labs  Lab 04/03/17 1319  WBC 10.0  HGB 14.0  HCT 42.5  PLT 151   ------------------------------------------------------------------------------------------------------------------  Chemistries  Recent Labs  Lab 04/03/17 1319 04/04/17 1009  NA 137 137  K 4.5 3.8  CL 102 106  CO2 25 23  GLUCOSE 150* 112*  BUN 24* 20  CREATININE 1.45* 1.34*  CALCIUM 9.6 8.8*  AST 25  --   ALT 12*  --   ALKPHOS 51  --   BILITOT 1.0  --    ------------------------------------------------------------------------------------------------------------------  Cardiac Enzymes Recent Labs  Lab 04/04/17 0423  TROPONINI 0.03*   ------------------------------------------------------------------------------------------------------------------  RADIOLOGY:  Dg Chest 2 View  Result Date: 04/03/2017 CLINICAL DATA:  Weakness and shortness of breath.  CVA. EXAM: CHEST  2 VIEW COMPARISON:  Radiographs 08/07/2015, CT 12/19/2015 FINDINGS: Heart size upper normal. Lower lung volumes from prior exam. Atherosclerosis and tortuosity the of the thoracic aorta. Multiple chronic calcified pulmonary nodules consistent prior granulomatous disease. No pulmonary edema, confluent airspace disease, pleural effusion or pneumothorax. Progressive compression fracture of the midthoracic spine since 12/19/2015 CT. IMPRESSION: 1. Mild aortic tortuosity and atherosclerosis. No congestive failure or acute abnormality. 2. Progressive compression fracture of T8 from CT 12/19/2015. Electronically Signed   By: Jeb Levering M.D.   On: 04/03/2017 23:13  Ct Head Wo  Contrast  Result Date: 04/04/2017 CLINICAL DATA:  83 year old female with left thalamic stroke. Fatigue. Subsequent encounter. EXAM: CT HEAD WITHOUT CONTRAST TECHNIQUE: Contiguous axial images were obtained from the base of the skull through the vertex without intravenous contrast. COMPARISON:  04/03/2017 MR and CT. FINDINGS: Brain: Nonhemorrhagic acute anterior left thalamic infarct of similar size to the recent MR. Remote anterior right lenticular nucleus infarct. Prominent chronic microvascular changes. No intracranial hemorrhage. Global atrophy. No intracranial mass lesion noted on this unenhanced exam. Vascular: Vascular calcifications. Skull: No acute abnormality. Sinuses/Orbits: No acute orbital abnormality. Opacification posterior left ethmoid sinus air cell. Other: Mastoid air cells and middle ear cavities are clear. IMPRESSION: Nonhemorrhagic acute anterior left thalamic infarct of similar size to the recent MR. No acute abnormality otherwise noted. Electronically Signed   By: Genia Del M.D.   On: 04/04/2017 09:48   Mr Jeri Cos And Wo Contrast  Result Date: 04/03/2017 CLINICAL DATA:  Altered mental status after fall at care facility. Follow-up suspected LEFT stroke. History of hypertension, hyperlipidemia. EXAM: MRI HEAD WITHOUT AND WITH CONTRAST TECHNIQUE: Multiplanar, multiecho pulse sequences of the brain and surrounding structures were obtained without and with intravenous contrast. CONTRAST:  32mL MULTIHANCE GADOBENATE DIMEGLUMINE 529 MG/ML IV SOLN COMPARISON:  CT HEAD April 03, 2017 at 1334 hours and MRI of the head December 10, 2016 FINDINGS: INTRACRANIAL CONTENTS: 21 x 11 mm reduced diffusion LEFT thalamus with low ADC values. Similar scattered supra- and infratentorial chronic micro hemorrhages associated with chronic hypertension. Moderate to severe parenchymal brain volume loss. No hydrocephalus old RIGHT basal ganglia lacunar infarct with mild ex vacuo dilatation RIGHT frontal horn  of the lateral ventricle. No midline shift, mass effect or masses. Patchy to confluent supratentorial white matter FLAIR T2 hyperintensities. No abnormal extra-axial fluid collections. VASCULAR: Normal major intracranial vascular flow voids present at skull base. SKULL AND UPPER CERVICAL SPINE: No abnormal sellar expansion. No suspicious calvarial bone marrow signal. Craniocervical junction maintained. SINUSES/ORBITS: The mastoid air-cells and included paranasal sinuses are well-aerated.The included ocular globes and orbital contents are non-suspicious. Status post bilateral ocular lens implants. OTHER: None. IMPRESSION: 1. Acute nonhemorrhagic LEFT thalamus infarct. 2. Moderate to severe parenchymal brain volume loss. 3. Moderate to severe chronic small vessel ischemic disease. Old RIGHT basal ganglia lacunar infarct. Electronically Signed   By: Elon Alas M.D.   On: 04/03/2017 17:08   Mr Jodene Nam Head/brain IR Cm  Result Date: 04/04/2017 CLINICAL DATA:  Initial evaluation for acute stroke. EXAM: MRA HEAD WITHOUT CONTRAST TECHNIQUE: Angiographic images of the Circle of Willis were obtained using MRA technique without intravenous contrast. COMPARISON:  Prior MRI from 04/03/2017. FINDINGS: ANTERIOR CIRCULATION: Distal cervical segments of the internal carotid arteries are patent with antegrade flow. Petrous, cavernous, and supraclinoid segments patent without flow-limiting stenosis. A1 segments patent bilaterally. Grossly normal anterior communicating artery. Anterior cerebral artery is patent to their distal aspects without flow-limiting stenosis. Atheromatous regularity within knee left M1 segment with superimposed mild to moderate mid left M1 stenosis (series 104, image 9). Right M1 segment bifurcates early. Small vessel atheromatous irregularity within the distal MCA branches bilaterally. POSTERIOR CIRCULATION: Visualized vertebral arteries patent to the vertebrobasilar junction without stenosis. Partially  visualized posterior inferior cerebral arteries patent bilaterally. Basilar artery mildly tortuous but widely patent to its distal aspect. Superior cerebral arteries patent proximally. Both of the posterior cerebral arteries primarily supplied via the basilar. Moderate atheromatous irregularity throughout the PCAs bilaterally, left slightly worse than right. Short-segment moderate proximal  left P2 stenosis (series 100, image 10). Distal small vessel atheromatous irregularity. Distal left PCA somewhat attenuated as compared to the right. No aneurysm. IMPRESSION: 1. Negative intracranial MRA for large vessel occlusion. No severe high-grade or flow-limiting stenosis. 2. Moderate atherosclerotic changes involving the intracranial circulation, most notable within the left M1 segment and bilateral P2 segments, left worse than right. Electronically Signed   By: Jeannine Boga M.D.   On: 04/04/2017 04:16     ASSESSMENT AND PLAN:   83 year old female with past medical history of hypertension, hypothyroidism, hyperlipidemia, IBS, osteoarthritis, restless leg syndrome, sleep apnea who presented to the hospital due to AMS and noted to have Acute CVA.   1. Acute CVA-this is the cause of patient's altered mental status and right-sided weakness.  -Patient remains aphasic but more awake today. Remains weak on the right side. MRI did confirm yesterday a left-sided thalamic CVA. -Appreciate neurology input. Continue aspirin, Plavix, atorvastatin. Await physical therapy and occupational therapy evaluation. Patient likely to be discharged to short-term rehabilitation in the next 1-2 days.  2.  Hyperlipidemia - cont. ATorvastatin  3. Depression-continue Cymbalta  4. Restless leg syndrome-continue Requip.  5. Hypothyroidism-continue Synthroid.   All the records are reviewed and case discussed with Care Management/Social Worker. Management plans discussed with the patient, family and they are in  agreement.  CODE STATUS: DNR  DVT Prophylaxis: Hep. SQ  TOTAL TIME TAKING CARE OF THIS PATIENT: 30 minutes.   POSSIBLE D/C IN 1-2 DAYS, DEPENDING ON CLINICAL CONDITION.   Henreitta Leber M.D on 04/05/2017 at 3:27 PM  Between 7am to 6pm - Pager - [phone]  After 6pm go to [email or web address] - Proofreader  [facility]  Office  [phone]  CC: Primary care physician; [place] Pink, MD

## 2017-04-06 MED ORDER — CLOPIDOGREL BISULFATE 75 MG PO TABS: 75.0000 mg | ORAL_TABLET | Freq: Every day | ORAL | Status: DC

## 2017-04-06 MED ORDER — ASPIRIN 81 MG PO TBEC: 81.0000 mg | DELAYED_RELEASE_TABLET | Freq: Every day | ORAL | Status: DC

## 2017-04-06 MED ORDER — METOPROLOL TARTRATE 25 MG PO TABS: 12.5000 mg | ORAL_TABLET | Freq: Two times a day (BID) | ORAL | Status: DC

## 2017-04-06 MED ORDER — ATORVASTATIN CALCIUM 40 MG PO TABS: 40.0000 mg | ORAL_TABLET | Freq: Every day | ORAL | Status: AC

## 2017-04-06 NOTE — Care Management (Signed)
PT recommending CIR. Requested Pamala Hurry with [facility] CIR lok at patient for appropriateness.

## 2017-04-06 NOTE — Progress Notes (Signed)
Pt discharged via wheelchair by nursing to the visitor's entrance 

## 2017-04-06 NOTE — Clinical Social Work Placement (Signed)
   CLINICAL SOCIAL WORK PLACEMENT  NOTE  Date:  04/06/2017  Patient Details  Name: Kaitlyn Ramos MRN: [id number] Date of Birth: [birth year]  Clinical Social Work is seeking post-discharge placement for this patient at the [place] level of care (*CSW will initial, date and re-position this form in  chart as items are completed):  Yes   Patient/family provided with [facility] Work Department's list of facilities offering this level of care within the geographic area requested by the patient (or if unable, by the patient's family).  Yes   Patient/family informed of their freedom to choose among providers that offer the needed level of care, that participate in Medicare, Medicaid or managed care program needed by the patient, have an available bed and are willing to accept the patient.  Yes   Patient/family informed of [facility]'s ownership interest in [facility] and [facility], as well as of the fact that they are under no obligation to receive care at these facilities.  PASRR submitted to EDS on       PASRR number received on       Existing PASRR number confirmed on 04/04/17     FL2 transmitted to all facilities in geographic area requested by pt/family on 04/04/17     FL2 transmitted to all facilities within larger geographic area on       Patient informed that his/her managed care company has contracts with or will negotiate with certain facilities, including the following:        Yes   Patient/family informed of bed offers received.  Patient chooses bed at [facility] )     Physician recommends and patient chooses bed at      Patient to be transferred to [facility] ) on 04/06/17.  Patient to be transferred to facility by (Patient's daughter Joelene Millin will transport. )     Patient family notified on 04/06/17 of transfer.  Name of family member notified:  (Patient's daughter Joelene Millin is at bedside and aware of D/C today.  )     PHYSICIAN       Additional Comment:    _______________________________________________ Shalina Norfolk, Veronia Beets, LCSW 04/06/2017, 2:17 PM

## 2017-04-06 NOTE — Progress Notes (Signed)
MD order received in [facility] to discharge pt to SNF/[facility] today; see Care Management's notes regarding discharge; report called to [phone] for Room 215; spoke to [place], no questions voiced at this time.

## 2017-04-06 NOTE — Progress Notes (Signed)
Rehab Admissions Coordinator Note:  Patient was screened by Cleatrice Burke for appropriateness for an Inpatient Acute Rehab Consult per PT and OT recommendations. Noted pt with frequent falls and lives in [place] living at [facility]. SLP noted severe aphasia.  At this time, we are recommending [facility]. Following SNF rehab feel pt will need assisted living level of are due to her aphasia and fall risk. I updated RN CM, Lattie Haw, on my recommendations at this time.   Cleatrice Burke 04/06/2017, 8:48 AM  I can be reached at [phone].

## 2017-04-06 NOTE — Progress Notes (Signed)
AVS reviewed with pt and pt's daughter, no questions voiced at this time; discharge packet given to pt's daughter, who will provide transportation to [facility]

## 2017-04-06 NOTE — Discharge Summary (Signed)
[facility] at [place] NAME: Kaitlyn Ramos    MR#:  [id number]  DATE OF BIRTH:  1928-03-29  DATE OF ADMISSION:  04/03/2017 ADMITTING PHYSICIAN: Gorden Harms, MD  DATE OF DISCHARGE: 04/06/2017  PRIMARY CARE PHYSICIAN: [place] Pink, MD    ADMISSION DIAGNOSIS:  Acute encephalopathy [G93.40]  DISCHARGE DIAGNOSIS:  Active Problems:   CVA (cerebral vascular accident) ([place])   Acute encephalopathy   SECONDARY DIAGNOSIS:   Past Medical History:  Diagnosis Date  . Cervicalgia   . Depression   . Essential hypertension   . Hemorrhoids   . History of MRSA infection   . Hyperlipidemia   . Hypertension   . Hypothyroidism   . IBS (irritable bowel syndrome)   . Osteoarthritis of multiple joints   . Restless leg   . Senile osteoporosis   . Shingles   . Sleep apnea   . Ulcerative colitis ([place])   . Urge incontinence     HOSPITAL COURSE:   83 year old female with past medical history of hypertension, hypothyroidism, hyperlipidemia, IBS, osteoarthritis, restless leg syndrome, sleep apnea who presented to the hospital due to AMS and noted to have Acute CVA.   1. Acute CVA-this was the cause of patient's altered mental status and right-sided weakness.  Should patient continues to be aphasic but her right-sided weakness has improved. Seen by neurology and placed on dual antiplatelet therapy with aspirin, Plavix. Patient's MRI did confirm a left-sided thalamic CVA which is the cause of patient's neurologic symptoms as mentioned. -A physical therapy and occupational therapy evaluation was obtained and recommended acute inpatient rehabilitation the patient and likely cannot tolerate 3 hours of intensive therapy presently. She is being discharged to her skilled nursing facility with ongoing physical therapy. She will also need ongoing speech therapy given her ongoing aphasia. -She was also seen by speech therapy and no restrictions to her diet  were made.   2.  Hyperlipidemia - She will cont. Atorvastatin  3. Depression- She will continue Cymbalta  4. Restless leg syndrome- she will continue Requip.  5. Hypothyroidism- she will continue Synthroid  6. Essential hypertension-patient will continue low-dose metoprolol, losartan, and Aldactone.   DISCHARGE CONDITIONS:   Stable.   CONSULTS OBTAINED:  Treatment Team:  Alexis Goodell, MD  DRUG ALLERGIES:   Allergies  Allergen Reactions  . Codeine Sulfate Hives and Itching    Can take tramadol  . Codeine     DISCHARGE MEDICATIONS:   Allergies as of 04/06/2017      Reactions   Codeine Sulfate Hives, Itching   Can take tramadol   Codeine       Medication List    STOP taking these medications   amLODipine 10 MG tablet Commonly known as:  NORVASC     TAKE these medications   aspirin 81 MG EC tablet Take 1 tablet (81 mg total) by mouth daily. Start taking on:  04/07/2017   atorvastatin 40 MG tablet Commonly known as:  LIPITOR Take 1 tablet (40 mg total) by mouth daily at 6 PM.   CALCIUM 500 + D 500-125 MG-UNIT Tabs Generic drug:  Calcium Carbonate-Vitamin D Take 1 tablet by mouth daily.   clopidogrel 75 MG tablet Commonly known as:  PLAVIX Take 1 tablet (75 mg total) by mouth daily. Start taking on:  04/07/2017   cyanocobalamin 1000 MCG tablet Take 1,000 mcg by mouth daily.   DULoxetine 30 MG capsule Commonly known as:  CYMBALTA Take 30 mg by  mouth daily.   etodolac 400 MG tablet Commonly known as:  LODINE Take 400 mg by mouth 2 (two) times daily.   fluticasone 50 MCG/ACT nasal spray Commonly known as:  FLONASE Place into the nose.   levothyroxine 50 MCG tablet Commonly known as:  SYNTHROID, LEVOTHROID Take 50 mcg by mouth daily before breakfast.   losartan 50 MG tablet Commonly known as:  COZAAR Take 50 mg by mouth daily.   metoCLOPramide 10 MG tablet Commonly known as:  REGLAN Take 1 tablet (10 mg total) by mouth every 8  (eight) hours as needed.   metoprolol tartrate 25 MG tablet Commonly known as:  LOPRESSOR Take 0.5 tablets (12.5 mg total) by mouth 2 (two) times daily. What changed:  how much to take   MULTIVITAMIN ADULT PO Take by mouth daily.   ondansetron 4 MG disintegrating tablet Commonly known as:  ZOFRAN ODT Take 1 tablet (4 mg total) by mouth every 8 (eight) hours as needed for nausea or vomiting.   rOPINIRole 0.5 MG tablet Commonly known as:  REQUIP Take 1.5 mg by mouth at bedtime.   spironolactone 25 MG tablet Commonly known as:  ALDACTONE Take 25 mg by mouth daily.   VESICARE 5 MG tablet Generic drug:  solifenacin TAKE 1 TABLET BY MOUTH ONCE A DAY   vitamin E 400 UNIT capsule Take 400 Units by mouth daily.         DISCHARGE INSTRUCTIONS:   DIET:  Cardiac diet  DISCHARGE CONDITION:  Stable  ACTIVITY:  Activity as tolerated  OXYGEN:  Home Oxygen: No.   Oxygen Delivery: room air  DISCHARGE LOCATION:  nursing home   If you experience worsening of your admission symptoms, develop shortness of breath, life threatening emergency, suicidal or homicidal thoughts you must seek medical attention immediately by calling 911 or calling your MD immediately  if symptoms less severe.  You Must read complete instructions/literature along with all the possible adverse reactions/side effects for all the Medicines you take and that have been prescribed to you. Take any new Medicines after you have completely understood and accpet all the possible adverse reactions/side effects.   Please note  You were cared for by a hospitalist during your hospital stay. If you have any questions about your discharge medications or the care you received while you were in the hospital after you are discharged, you can call the unit and asked to speak with the hospitalist on call if the hospitalist that took care of you is not available. Once you are discharged, your primary care physician will handle  any further medical issues. Please note that NO REFILLS for any discharge medications will be authorized once you are discharged, as it is imperative that you return to your primary care physician (or establish a relationship with a primary care physician if you do not have one) for your aftercare needs so that they can reassess your need for medications and monitor your lab values.     Today    VITAL SIGNS:  Blood pressure 139/69, pulse 84, temperature (!) 97.4 F (36.3 C), temperature source Oral, resp. rate 16, height 5' (1.524 m), weight 61.2 kg (135 lb), SpO2 97 %.  I/O:    Intake/Output Summary (Last 24 hours) at 04/06/2017 1229 Last data filed at 04/06/2017 1015 Gross per 24 hour  Intake 1080 ml  Output 500 ml  Net 580 ml    PHYSICAL EXAMINATION:  GENERAL:  83 y.o.-year-old patient lying in the bed with no  acute distress.  EYES: Pupils equal, round, reactive to light and accommodation. No scleral icterus. Extraocular muscles intact.  HEENT: Head atraumatic, normocephalic. Oropharynx and nasopharynx clear.  NECK:  Supple, no jugular venous distention. No thyroid enlargement, no tenderness.  LUNGS: Normal breath sounds bilaterally, no wheezing, rales,rhonchi. No use of accessory muscles of respiration.  CARDIOVASCULAR: S1, S2 normal. No murmurs, rubs, or gallops.  ABDOMEN: Soft, non-tender, non-distended. Bowel sounds present. No organomegaly or mass.  EXTREMITIES: No pedal edema, cyanosis, or clubbing.  NEUROLOGIC: Cranial nerves II through XII are intact. No focal motor or sensory defecits b/l.  PSYCHIATRIC: The patient is alert and oriented x 3. Good affect.  SKIN: No obvious rash, lesion, or ulcer.   DATA REVIEW:   CBC Recent Labs  Lab 04/03/17 1319  WBC 10.0  HGB 14.0  HCT 42.5  PLT 151    Chemistries  Recent Labs  Lab 04/03/17 1319 04/04/17 1009  NA 137 137  K 4.5 3.8  CL 102 106  CO2 25 23  GLUCOSE 150* 112*  BUN 24* 20  CREATININE 1.45* 1.34*   CALCIUM 9.6 8.8*  AST 25  --   ALT 12*  --   ALKPHOS 51  --   BILITOT 1.0  --     Cardiac Enzymes Recent Labs  Lab 04/04/17 0423  TROPONINI 0.03*     RADIOLOGY:  No results found.    Management plans discussed with the patient, family and they are in agreement.  CODE STATUS:     Code Status Orders  (From admission, onward)        Start     Ordered   04/04/17 0043  Do not attempt resuscitation (DNR)  Continuous    Question Answer Comment  In the event of cardiac or respiratory ARREST Do not call a "code blue"   In the event of cardiac or respiratory ARREST Do not perform Intubation, CPR, defibrillation or ACLS   In the event of cardiac or respiratory ARREST Use medication by any route, position, wound care, and other measures to relive pain and suffering. May use oxygen, suction and manual treatment of airway obstruction as needed for comfort.      04/04/17 0043  Advance Directive Documentation     Most Recent Value  Type of Advance Directive  Out of facility DNR (pink MOST or yellow form)  Pre-existing out of facility DNR order (yellow form or pink MOST form)  Yellow form placed in chart (order not valid for inpatient use)  "MOST" Form in Place?  No data      TOTAL TIME TAKING CARE OF THIS PATIENT: 40 minutes.    Henreitta Leber M.D on 04/06/2017 at 12:29 PM  Between 7am to 6pm - Pager - [phone]  After 6pm go to [email or web address] - Proofreader  [facility]  Office  [phone]  CC: Primary care physician; [place] Pink, MD

## 2017-04-06 NOTE — Progress Notes (Signed)
Patient is medically stable for D/C to [facility] today. Per Seth Bake admissions coordinator at [facility] patient can come today to room 215. RN will call report at [phone]. Patient's daughter Joelene Millin is at bedside and aware of above. Per daughter she will transport patient to [facility]. Patient is aware of above. Clinical Education officer, museum (CSW) sent D/C orders to [facility] via [facility]. Please reconsult if future social work needs rise. CSW signing off.   [employer], LCSW [phone]

## 2017-04-06 NOTE — Plan of Care (Signed)
  Progressing Education: Knowledge of General Education information will improve 04/06/2017 0600 - Progressing by Denice Bors, RN Health Behavior/Discharge Planning: Ability to manage health-related needs will improve 04/06/2017 0600 - Progressing by Denice Bors, RN Clinical Measurements: Ability to maintain clinical measurements within normal limits will improve 04/06/2017 0600 - Progressing by Denice Bors, RN Education: Knowledge of disease or condition will improve 04/06/2017 0600 - Progressing by Denice Bors, RN Knowledge of secondary prevention will improve 04/06/2017 0600 - Progressing by Denice Bors, RN Knowledge of patient specific risk factors addressed and post discharge goals established will improve 04/06/2017 0600 - Progressing by Denice Bors, RN Ischemic Stroke/TIA Tissue Perfusion: Complications of ischemic stroke/TIA will be minimized 04/06/2017 0600 - Progressing by Denice Bors, RN

## 2017-04-06 NOTE — Care Management Note (Signed)
Case Management Note  Patient Details  Name: Kaitlyn Ramos MRN: [id number] Date of Birth: [birth year]  Subjective/Objective:  Spoke with Pamala Hurry at [facility]. Patient has been declined and referred to go to [facility]. CSW updated. Spoke with daughter, Maudie Mercury, and updated her on assessment. She is in agreement with transfer back to [facility].                    Action/Plan:   Expected Discharge Date:  04/05/17               Expected Discharge Plan:  Skilled Nursing Facility  In-House Referral:  Clinical Social Work  Discharge planning Services  CM Consult  Post Acute Care Choice:    Choice offered to:     DME Arranged:    DME Agency:     HH Arranged:    [place] Agency:     Status of Service:  In process, will continue to follow  If discussed at Long Length of Stay Meetings, dates discussed:    Additional Comments:  Jolly Mango, RN 04/06/2017, 8:49 AM

## 2017-04-10 DIAGNOSIS — I10 Essential (primary) hypertension: Secondary | ICD-10-CM | POA: Diagnosis not present

## 2017-04-10 DIAGNOSIS — M159 Polyosteoarthritis, unspecified: Secondary | ICD-10-CM | POA: Diagnosis not present

## 2017-04-10 DIAGNOSIS — I6930 Unspecified sequelae of cerebral infarction: Secondary | ICD-10-CM | POA: Diagnosis not present

## 2017-04-10 DIAGNOSIS — F0150 Vascular dementia without behavioral disturbance: Secondary | ICD-10-CM | POA: Diagnosis not present

## 2017-04-10 DIAGNOSIS — F39 Unspecified mood [affective] disorder: Secondary | ICD-10-CM | POA: Diagnosis not present

## 2017-04-15 ENCOUNTER — Encounter: Payer: Self-pay | Admitting: Emergency Medicine

## 2017-04-15 ENCOUNTER — Emergency Department
Admission: EM | Admit: 2017-04-15 | Discharge: 2017-04-16 | Disposition: A | Discharge status: Home / Self Care | Payer: Medicare Other | Attending: Emergency Medicine | Admitting: Emergency Medicine

## 2017-04-15 ENCOUNTER — Other Ambulatory Visit: Payer: Self-pay

## 2017-04-15 ENCOUNTER — Emergency Department: Payer: Medicare Other

## 2017-04-15 DIAGNOSIS — S01111A Laceration without foreign body of right eyelid and periocular area, initial encounter: Secondary | ICD-10-CM | POA: Insufficient documentation

## 2017-04-15 DIAGNOSIS — Z7982 Long term (current) use of aspirin: Secondary | ICD-10-CM | POA: Insufficient documentation

## 2017-04-15 DIAGNOSIS — Y92122 Bedroom in nursing home as the place of occurrence of the external cause: Secondary | ICD-10-CM | POA: Diagnosis not present

## 2017-04-15 DIAGNOSIS — F0390 Unspecified dementia without behavioral disturbance: Secondary | ICD-10-CM | POA: Diagnosis not present

## 2017-04-15 DIAGNOSIS — W19XXXA Unspecified fall, initial encounter: Secondary | ICD-10-CM | POA: Insufficient documentation

## 2017-04-15 DIAGNOSIS — Z87891 Personal history of nicotine dependence: Secondary | ICD-10-CM | POA: Diagnosis not present

## 2017-04-15 DIAGNOSIS — Z7902 Long term (current) use of antithrombotics/antiplatelets: Secondary | ICD-10-CM | POA: Insufficient documentation

## 2017-04-15 DIAGNOSIS — Z79899 Other long term (current) drug therapy: Secondary | ICD-10-CM | POA: Diagnosis not present

## 2017-04-15 DIAGNOSIS — Y939 Activity, unspecified: Secondary | ICD-10-CM | POA: Diagnosis not present

## 2017-04-15 DIAGNOSIS — S199XXA Unspecified injury of neck, initial encounter: Secondary | ICD-10-CM | POA: Diagnosis not present

## 2017-04-15 DIAGNOSIS — S0181XA Laceration without foreign body of other part of head, initial encounter: Secondary | ICD-10-CM

## 2017-04-15 DIAGNOSIS — Y999 Unspecified external cause status: Secondary | ICD-10-CM | POA: Diagnosis not present

## 2017-04-15 DIAGNOSIS — E039 Hypothyroidism, unspecified: Secondary | ICD-10-CM | POA: Insufficient documentation

## 2017-04-15 DIAGNOSIS — S0990XA Unspecified injury of head, initial encounter: Secondary | ICD-10-CM | POA: Diagnosis not present

## 2017-04-15 DIAGNOSIS — S0511XA Contusion of eyeball and orbital tissues, right eye, initial encounter: Secondary | ICD-10-CM | POA: Diagnosis not present

## 2017-04-15 DIAGNOSIS — I10 Essential (primary) hypertension: Secondary | ICD-10-CM | POA: Insufficient documentation

## 2017-04-15 DIAGNOSIS — S0993XA Unspecified injury of face, initial encounter: Secondary | ICD-10-CM | POA: Diagnosis not present

## 2017-04-15 LAB — URINALYSIS, COMPLETE (UACMP) WITH MICROSCOPIC
BILIRUBIN URINE: NEGATIVE
GLUCOSE, UA: NEGATIVE mg/dL
Hgb urine dipstick: NEGATIVE
KETONES UR: NEGATIVE mg/dL
Nitrite: NEGATIVE
PH: 5 (ref 5.0–8.0)
Protein, ur: NEGATIVE mg/dL
SQUAMOUS EPITHELIAL / LPF: NONE SEEN
Specific Gravity, Urine: 1.024 (ref 1.005–1.030)

## 2017-04-15 LAB — BASIC METABOLIC PANEL
ANION GAP: 9 (ref 5–15)
BUN: 28 mg/dL — AB (ref 6–20)
CALCIUM: 9 mg/dL (ref 8.9–10.3)
CO2: 21 mmol/L — AB (ref 22–32)
CREATININE: 1.34 mg/dL — AB (ref 0.44–1.00)
Chloride: 106 mmol/L (ref 101–111)
GFR calc Af Amer: 40 mL/min — ABNORMAL LOW (ref >60)
GFR, EST NON AFRICAN AMERICAN: 35 mL/min — AB (ref >60)
GLUCOSE: 130 mg/dL — AB (ref 65–99)
Potassium: 4 mmol/L (ref 3.5–5.1)
Sodium: 136 mmol/L (ref 135–145)

## 2017-04-15 LAB — TROPONIN I: Troponin I: 0.03 ng/mL (ref <0.03)

## 2017-04-15 LAB — CBC
HEMATOCRIT: 38.9 % (ref 35.0–47.0)
Hemoglobin: 13.2 g/dL (ref 12.0–16.0)
MCH: 29.9 pg (ref 26.0–34.0)
MCHC: 34 g/dL (ref 32.0–36.0)
MCV: 87.8 fL (ref 80.0–100.0)
PLATELETS: 197 10*3/uL (ref 150–440)
RBC: 4.43 MIL/uL (ref 3.80–5.20)
RDW: 14 % (ref 11.5–14.5)
WBC: 7.6 10*3/uL (ref 3.6–11.0)

## 2017-04-15 NOTE — ED Triage Notes (Signed)
Pt to rm 3 via EMS from [facility], reports unwitnessed fall, found on floor outside bathroom, hematoma to right brow w/ 1" laceration, bleeding controlled at this time.  Pt w/ baseline dementia.  Pt reports soreness to head and arms, pt unable to recall circumstances of fall

## 2017-04-16 DIAGNOSIS — S01111A Laceration without foreign body of right eyelid and periocular area, initial encounter: Secondary | ICD-10-CM | POA: Diagnosis not present

## 2017-04-16 MED ORDER — ONDANSETRON HCL 4 MG/2ML IJ SOLN
4.0000 mg | Freq: Once | INTRAMUSCULAR | Status: AC
  Administered 2017-04-16: 4 mg via INTRAVENOUS
  Filled 2017-04-16: qty 2

## 2017-04-16 MED ORDER — LIDOCAINE HCL (PF) 1 % IJ SOLN
INTRAMUSCULAR | Status: AC
  Administered 2017-04-16: 5 mL
  Filled 2017-04-16: qty 5

## 2017-04-16 NOTE — ED Provider Notes (Addendum)
[facility] Emergency Department Provider Note    First MD Initiated Contact with Patient 04/15/17 2214     (approximate)  I have reviewed the triage vital signs and the nursing notes.  Level 5 caveat: History limited secondary to dementia HISTORY  Chief Complaint Fall and Laceration    HPI Kaitlyn Ramos is a 83 y.o. female below list of chronic medical conditions presents to the emergency department following an unwitnessed fall at [facility] facility.  Patient was found on the floor outside of her bathroom with a laceration and hematoma noted to the right eyebrow.  Patient does not recall circumstances of fall.   Past Medical History:  Diagnosis Date  . Cervicalgia   . Depression   . Essential hypertension   . Hemorrhoids   . History of MRSA infection   . Hyperlipidemia   . Hypertension   . Hypothyroidism   . IBS (irritable bowel syndrome)   . Osteoarthritis of multiple joints   . Restless leg   . Senile osteoporosis   . Shingles   . Sleep apnea   . Ulcerative colitis ([place])   . Urge incontinence     Patient Active Problem List   Diagnosis Date Noted  . Acute encephalopathy   . CVA (cerebral vascular accident) ([place]) 04/03/2017  . Syncope 03/03/2016  . Pain in shoulder 11/25/2014  . Impingement syndrome of shoulder 11/25/2014  . Bilirubin in urine 10/04/2014  . Recurrent UTI 09/28/2014  . Urge incontinence 09/28/2014  . Microscopic hematuria 09/28/2014  . Cervical pain 09/28/2014  . Degenerative joint disease involving multiple joints 09/28/2014  . Involutional osteoporosis 09/28/2014  . Adult hypothyroidism 09/28/2014  . Acute low back pain 08/26/2014  . Arthralgia of hip 08/26/2014  . Combined fat and carbohydrate induced hyperlipemia 01/23/2014  . BP (high blood pressure) 01/23/2014  . Apnea, sleep 01/23/2014    Past Surgical History:  Procedure Laterality Date  . ABDOMINAL HYSTERECTOMY  1980  . cataract surgery    .  CHOLECYSTECTOMY  1955  . colon polyp removal  12/2012  . ESOPHAGOGASTRODUODENOSCOPY  2014  . makoplasty Left 06/2012  . parotid gland removal  1984    Prior to Admission medications   Medication Sig Start Date End Date Taking? Authorizing Provider  aspirin EC 81 MG EC tablet Take 1 tablet (81 mg total) by mouth daily. 04/07/17  Yes Henreitta Leber, MD  atorvastatin (LIPITOR) 40 MG tablet Take 1 tablet (40 mg total) by mouth daily at 6 PM. 04/06/17  Yes Sainani, Belia Heman, MD  Calcium Carbonate-Vitamin D (CALCIUM 500 + D) 500-125 MG-UNIT TABS Take 1 tablet by mouth daily.    Yes [provider]  clopidogrel (PLAVIX) 75 MG tablet Take 1 tablet (75 mg total) by mouth daily. 04/07/17  Yes Henreitta Leber, MD  cyanocobalamin 1000 MCG tablet Take 1,000 mcg by mouth daily.   Yes [provider]  DULoxetine (CYMBALTA) 30 MG capsule Take 30 mg by mouth daily.   Yes [provider]  etodolac (LODINE) 400 MG tablet Take 400 mg by mouth 2 (two) times daily. 09/07/14  Yes [provider]  levothyroxine (SYNTHROID, LEVOTHROID) 50 MCG tablet Take 50 mcg by mouth daily before breakfast.   Yes [provider]  losartan (COZAAR) 50 MG tablet Take 50 mg by mouth daily.    Yes [provider]  metoCLOPramide (REGLAN) 10 MG tablet Take 1 tablet (10 mg total) by mouth every 8 (eight) hours as  needed. 12/10/16  Yes Loney Hering, MD  metoprolol tartrate (LOPRESSOR) 25 MG tablet Take 0.5 tablets (12.5 mg total) by mouth 2 (two) times daily. 04/06/17  Yes Henreitta Leber, MD  Multiple Vitamins-Minerals (MULTIVITAMIN ADULT PO) Take by mouth daily.   Yes [provider]  ondansetron (ZOFRAN ODT) 4 MG disintegrating tablet Take 1 tablet (4 mg total) by mouth every 8 (eight) hours as needed for nausea or vomiting. 11/11/16  Yes Harvest Dark, MD  rOPINIRole (REQUIP) 0.5 MG tablet Take 1.5 mg by mouth at bedtime.    Yes [provider]    spironolactone (ALDACTONE) 25 MG tablet Take 25 mg by mouth daily.   Yes [provider]  VESICARE 5 MG tablet TAKE 1 TABLET BY MOUTH ONCE A DAY 02/15/17  Yes McGowan, Shannon A, PA-C  vitamin E 400 UNIT capsule Take 400 Units by mouth daily.   Yes [provider]    Allergies Codeine sulfate and Codeine  Family History  Problem Relation Age of Onset  . Breast cancer Daughter   . Colon cancer Maternal Grandfather   . Hypertension Father   . Dementia Father   . Arthritis Mother   . Osteoporosis Mother   . Kidney disease Neg Hx   . Bladder Cancer Neg Hx     Social History Social History   Tobacco Use  . Smoking status: Former Research scientist (life sciences)  . Smokeless tobacco: Former Systems developer  . Tobacco comment: quit at age 16  Substance Use Topics  . Alcohol use: No    Alcohol/week: 0.0 oz    Comment: occasional  . Drug use: No    Review of Systems Constitutional: No fever/chills Eyes: No visual changes. ENT: No sore throat. Cardiovascular: Denies chest pain. Respiratory: Denies shortness of breath. Gastrointestinal: No abdominal pain.  No nausea, no vomiting.  No diarrhea.  No constipation. Genitourinary: Negative for dysuria. Musculoskeletal: Negative for neck pain.  Negative for back pain. Integumentary: Negative for rash.  Positive for right eyebrow laceration Neurological: Negative for headaches, focal weakness or numbness.   ____________________________________________   PHYSICAL EXAM:  VITAL SIGNS: ED Triage Vitals  Enc Vitals Group     BP 04/15/17 2219 (!) 143/92     Pulse Rate 04/15/17 2219 91     Resp 04/15/17 2219 (!) 22     Temp 04/15/17 2219 98.3 F (36.8 C)     Temp Source 04/15/17 2219 Oral     SpO2 04/15/17 2219 96 %     Weight 04/15/17 2215 61.2 kg (135 lb)     Height 04/15/17 2215 1.6 m (5\' 3" )     Head Circumference --      Peak Flow --      Pain Score --      Pain Loc --      Pain Edu? --      Excl. in [place]? --     Constitutional:  Alert  Well appearing and in no acute distress. Eyes: Conjunctivae are normal. PERRL. EOMI. 1 inch linear right eyebrow laceration Head: Atraumatic. Mouth/Throat: Mucous membranes are moist.  Oropharynx non-erythematous. Neck: No stridor.  No cervical spine tenderness to palpation. Cardiovascular: Normal rate, regular rhythm. Good peripheral circulation. Grossly normal heart sounds. Respiratory: Normal respiratory effort.  No retractions. Lungs CTAB. Gastrointestinal: Soft and nontender. No distention.  Musculoskeletal: No lower extremity tenderness nor edema. No gross deformities of extremities. Neurologic:  Normal speech and language. No gross focal neurologic deficits are appreciated.  Skin: 1 inch  linear right eyebrow Psychiatric: Mood and affect are normal. Speech and behavior are normal.  ____________________________________________   LABS (all labs ordered are listed, but only abnormal results are displayed)  Labs Reviewed  BASIC METABOLIC PANEL - Abnormal; Notable for the following components:      Result Value   CO2 21 (*)    Glucose, Bld 130 (*)    BUN 28 (*)    Creatinine, Ser 1.34 (*)    GFR calc non Af Amer 35 (*)    GFR calc Af Amer 40 (*)    All other components within normal limits  TROPONIN I - Abnormal; Notable for the following components:   Troponin I 0.03 (*)    All other components within normal limits  URINALYSIS, COMPLETE (UACMP) WITH MICROSCOPIC - Abnormal; Notable for the following components:   Color, Urine YELLOW (*)    APPearance HAZY (*)    Leukocytes, UA TRACE (*)    Bacteria, UA RARE (*)    All other components within normal limits  CBC   ____________________________________________  EKG  ED ECG REPORT I, Milan N Arthea Nobel, the attending physician, personally viewed and interpreted this ECG.   Date: 04/16/2017  EKG Time: 10:17 PM   Rate: 89  Rhythm: Normal sinus rhythm  Axis: Normal  Intervals: Normal  ST&T Change:  None  ____________________________________________  RADIOLOGY I,  N Jakeya Gherardi, personally viewed and evaluated these images (plain radiographs) as part of my medical decision making, as well as reviewing the written report by the radiologist.  Ct Head Wo Contrast  Result Date: 04/15/2017 CLINICAL DATA:  83 year old female with head trauma. EXAM: CT HEAD WITHOUT CONTRAST CT MAXILLOFACIAL WITHOUT CONTRAST CT CERVICAL SPINE WITHOUT CONTRAST TECHNIQUE: Multidetector CT imaging of the head, cervical spine, and maxillofacial structures were performed using the standard protocol without intravenous contrast. Multiplanar CT image reconstructions of the cervical spine and maxillofacial structures were also generated. COMPARISON:  Head CT dated 04/04/2017 FINDINGS: CT HEAD FINDINGS Brain: Mild to moderate age-related atrophy and chronic microvascular ischemic changes. Left anterior thymic hypodensity correspond to the infarct seen on the prior CT. There is no acute intracranial hemorrhage. No mass effect or midline shift. No extra-axial fluid collection. Old infarct and encephalomalacia lateral to the right caudate. Vascular: No hyperdense vessel or unexpected calcification. Skull: Normal. Negative for fracture or focal lesion. Other: Right periorbital hematoma. CT MAXILLOFACIAL FINDINGS Osseous: No fracture or mandibular dislocation. No destructive process. Orbits: Bilateral cataract surgeries. The globes and retro-orbital fat are preserved. Sinuses: Mild mucoperiosteal thickening of paranasal sinuses. No air-fluid levels. The mastoid air cells are clear. Soft tissues: Right facial and periorbital hematoma. CT CERVICAL SPINE FINDINGS Alignment: No acute subluxed Skull base and vertebrae: No acute fracture.  Osteopenia. Soft tissues and spinal canal: No prevertebral fluid or swelling. No visible canal hematoma. Disc levels: Multilevel degenerative changes. Grade 1 C7-T1 anterolisthesis. Upper chest: Small  calcified granuloma. Other: None IMPRESSION: 1. No acute intracranial hemorrhage. 2. Age-related atrophy chronic microvascular ischemic changes. 3. No acute/traumatic cervical spine pathology. 4. No facial bone fractures. Electronically Signed   By: Anner Crete M.D.   On: 04/15/2017 23:10   Ct Cervical Spine Wo Contrast  Result Date: 04/15/2017 CLINICAL DATA:  83 year old female with head trauma. EXAM: CT HEAD WITHOUT CONTRAST CT MAXILLOFACIAL WITHOUT CONTRAST CT CERVICAL SPINE WITHOUT CONTRAST TECHNIQUE: Multidetector CT imaging of the head, cervical spine, and maxillofacial structures were performed using the standard protocol without intravenous contrast. Multiplanar CT image reconstructions of the cervical  spine and maxillofacial structures were also generated. COMPARISON:  Head CT dated 04/04/2017 FINDINGS: CT HEAD FINDINGS Brain: Mild to moderate age-related atrophy and chronic microvascular ischemic changes. Left anterior thymic hypodensity correspond to the infarct seen on the prior CT. There is no acute intracranial hemorrhage. No mass effect or midline shift. No extra-axial fluid collection. Old infarct and encephalomalacia lateral to the right caudate. Vascular: No hyperdense vessel or unexpected calcification. Skull: Normal. Negative for fracture or focal lesion. Other: Right periorbital hematoma. CT MAXILLOFACIAL FINDINGS Osseous: No fracture or mandibular dislocation. No destructive process. Orbits: Bilateral cataract surgeries. The globes and retro-orbital fat are preserved. Sinuses: Mild mucoperiosteal thickening of paranasal sinuses. No air-fluid levels. The mastoid air cells are clear. Soft tissues: Right facial and periorbital hematoma. CT CERVICAL SPINE FINDINGS Alignment: No acute subluxed Skull base and vertebrae: No acute fracture.  Osteopenia. Soft tissues and spinal canal: No prevertebral fluid or swelling. No visible canal hematoma. Disc levels: Multilevel degenerative changes.  Grade 1 C7-T1 anterolisthesis. Upper chest: Small calcified granuloma. Other: None IMPRESSION: 1. No acute intracranial hemorrhage. 2. Age-related atrophy chronic microvascular ischemic changes. 3. No acute/traumatic cervical spine pathology. 4. No facial bone fractures. Electronically Signed   By: Anner Crete M.D.   On: 04/15/2017 23:10   Ct Maxillofacial Wo Contrast  Result Date: 04/15/2017 CLINICAL DATA:  83 year old female with head trauma. EXAM: CT HEAD WITHOUT CONTRAST CT MAXILLOFACIAL WITHOUT CONTRAST CT CERVICAL SPINE WITHOUT CONTRAST TECHNIQUE: Multidetector CT imaging of the head, cervical spine, and maxillofacial structures were performed using the standard protocol without intravenous contrast. Multiplanar CT image reconstructions of the cervical spine and maxillofacial structures were also generated. COMPARISON:  Head CT dated 04/04/2017 FINDINGS: CT HEAD FINDINGS Brain: Mild to moderate age-related atrophy and chronic microvascular ischemic changes. Left anterior thymic hypodensity correspond to the infarct seen on the prior CT. There is no acute intracranial hemorrhage. No mass effect or midline shift. No extra-axial fluid collection. Old infarct and encephalomalacia lateral to the right caudate. Vascular: No hyperdense vessel or unexpected calcification. Skull: Normal. Negative for fracture or focal lesion. Other: Right periorbital hematoma. CT MAXILLOFACIAL FINDINGS Osseous: No fracture or mandibular dislocation. No destructive process. Orbits: Bilateral cataract surgeries. The globes and retro-orbital fat are preserved. Sinuses: Mild mucoperiosteal thickening of paranasal sinuses. No air-fluid levels. The mastoid air cells are clear. Soft tissues: Right facial and periorbital hematoma. CT CERVICAL SPINE FINDINGS Alignment: No acute subluxed Skull base and vertebrae: No acute fracture.  Osteopenia. Soft tissues and spinal canal: No prevertebral fluid or swelling. No visible canal hematoma.  Disc levels: Multilevel degenerative changes. Grade 1 C7-T1 anterolisthesis. Upper chest: Small calcified granuloma. Other: None IMPRESSION: 1. No acute intracranial hemorrhage. 2. Age-related atrophy chronic microvascular ischemic changes. 3. No acute/traumatic cervical spine pathology. 4. No facial bone fractures. Electronically Signed   By: Anner Crete M.D.   On: 04/15/2017 23:10      ..Laceration Repair Date/Time: 04/16/2017 2:22 AM Performed by: Gregor Hams, MD Authorized by: Gregor Hams, MD   Consent:    Consent obtained:  Verbal   Consent given by:  Patient   Risks discussed:  Pain and poor cosmetic result Anesthesia (see MAR for exact dosages):    Anesthesia method:  Local infiltration   Local anesthetic:  Lidocaine 1% w/o epi Laceration details:    Location:  Face   Face location:  R eyebrow   Length (cm):  3   Depth (mm):  5 Repair type:    Repair  type:  Simple Pre-procedure details:    Preparation:  Patient was prepped and draped in usual sterile fashion Treatment:    Area cleansed with:  Betadine and saline   Amount of cleaning:  Standard   Visualized foreign bodies/material removed: no   Skin repair:    Repair method:  Sutures   Suture size:  6-0   Suture material:  Nylon   Number of sutures:  5 Approximation:    Approximation:  Close Post-procedure details:    Dressing:  Open (no dressing)   Patient tolerance of procedure:  Tolerated well, no immediate complications     ____________________________________________   INITIAL IMPRESSION / ASSESSMENT AND PLAN / ED COURSE  As part of my medical decision making, I reviewed the following data within the electronic MEDICAL RECORD [id number] year old female presenting the emergency department following unwitnessed fall.  Patient's eyebrow laceration repaired without difficulty.  CT head cervical spine and maxillofacial negative.  Laboratory data unremarkable.  Troponin of 0.03 consistent with previous  troponin on 04/04/2017 ____________________________________________  FINAL CLINICAL IMPRESSION(S) / ED DIAGNOSES  Final diagnoses:  Facial laceration, initial encounter  Injury of head, initial encounter     MEDICATIONS GIVEN DURING THIS VISIT:  Medications  lidocaine (PF) (XYLOCAINE) 1 % injection (5 mLs  Given 04/16/17 0122)  ondansetron (ZOFRAN) injection 4 mg (4 mg Intravenous Given 04/16/17 0208)     ED Discharge Orders    None       Note:  This document was prepared using Dragon voice recognition software and may include unintentional dictation errors.    Gregor Hams, MD 04/16/17 Natasha Mead    Gregor Hams, MD 04/16/17 Rogene Houston

## 2017-04-16 NOTE — ED Notes (Addendum)
Pt's grandson reports pt gets motion sickness during transport, Dr. Owens Shark informed, VORB for zofran, to be given prior to EMS transport.

## 2017-04-16 NOTE — ED Notes (Signed)
EMS at bedside to transport back to [facility]

## 2017-04-17 DIAGNOSIS — Y92120 Kitchen in nursing home as the place of occurrence of the external cause: Secondary | ICD-10-CM | POA: Diagnosis not present

## 2017-04-17 DIAGNOSIS — S0181XA Laceration without foreign body of other part of head, initial encounter: Secondary | ICD-10-CM | POA: Diagnosis not present

## 2017-05-08 DIAGNOSIS — F39 Unspecified mood [affective] disorder: Secondary | ICD-10-CM | POA: Diagnosis not present

## 2017-05-08 DIAGNOSIS — F0150 Vascular dementia without behavioral disturbance: Secondary | ICD-10-CM | POA: Diagnosis not present

## 2017-05-08 DIAGNOSIS — G2581 Restless legs syndrome: Secondary | ICD-10-CM

## 2017-05-08 DIAGNOSIS — I6930 Unspecified sequelae of cerebral infarction: Secondary | ICD-10-CM | POA: Diagnosis not present

## 2017-05-08 DIAGNOSIS — I10 Essential (primary) hypertension: Secondary | ICD-10-CM | POA: Diagnosis not present

## 2017-06-09 DIAGNOSIS — G2581 Restless legs syndrome: Secondary | ICD-10-CM | POA: Diagnosis not present

## 2017-06-09 DIAGNOSIS — M1990 Unspecified osteoarthritis, unspecified site: Secondary | ICD-10-CM | POA: Diagnosis not present

## 2017-06-09 DIAGNOSIS — I69398 Other sequelae of cerebral infarction: Secondary | ICD-10-CM | POA: Diagnosis not present

## 2017-06-09 DIAGNOSIS — E039 Hypothyroidism, unspecified: Secondary | ICD-10-CM

## 2017-06-09 DIAGNOSIS — F0150 Vascular dementia without behavioral disturbance: Secondary | ICD-10-CM | POA: Diagnosis not present

## 2017-06-09 DIAGNOSIS — I10 Essential (primary) hypertension: Secondary | ICD-10-CM

## 2017-06-09 DIAGNOSIS — F39 Unspecified mood [affective] disorder: Secondary | ICD-10-CM | POA: Diagnosis not present

## 2017-07-06 DIAGNOSIS — I10 Essential (primary) hypertension: Secondary | ICD-10-CM | POA: Diagnosis not present

## 2017-07-06 DIAGNOSIS — F0150 Vascular dementia without behavioral disturbance: Secondary | ICD-10-CM | POA: Diagnosis not present

## 2017-07-06 DIAGNOSIS — I6930 Unspecified sequelae of cerebral infarction: Secondary | ICD-10-CM | POA: Diagnosis not present

## 2017-07-06 DIAGNOSIS — S300XXA Contusion of lower back and pelvis, initial encounter: Secondary | ICD-10-CM | POA: Diagnosis not present

## 2017-07-06 DIAGNOSIS — F39 Unspecified mood [affective] disorder: Secondary | ICD-10-CM | POA: Diagnosis not present

## 2017-08-17 DIAGNOSIS — R4701 Aphasia: Secondary | ICD-10-CM

## 2017-09-06 DIAGNOSIS — G2581 Restless legs syndrome: Secondary | ICD-10-CM | POA: Diagnosis not present

## 2017-09-06 DIAGNOSIS — M1990 Unspecified osteoarthritis, unspecified site: Secondary | ICD-10-CM | POA: Diagnosis not present

## 2017-09-06 DIAGNOSIS — I69398 Other sequelae of cerebral infarction: Secondary | ICD-10-CM

## 2017-09-06 DIAGNOSIS — F39 Unspecified mood [affective] disorder: Secondary | ICD-10-CM | POA: Diagnosis not present

## 2017-09-06 DIAGNOSIS — E039 Hypothyroidism, unspecified: Secondary | ICD-10-CM

## 2017-09-06 DIAGNOSIS — I10 Essential (primary) hypertension: Secondary | ICD-10-CM | POA: Diagnosis not present

## 2017-09-06 DIAGNOSIS — F0150 Vascular dementia without behavioral disturbance: Secondary | ICD-10-CM | POA: Diagnosis not present

## 2017-09-13 ENCOUNTER — Other Ambulatory Visit: Payer: Self-pay

## 2017-09-13 ENCOUNTER — Observation Stay: Payer: Medicare Other

## 2017-09-13 ENCOUNTER — Encounter: Payer: Self-pay | Admitting: Emergency Medicine

## 2017-09-13 ENCOUNTER — Encounter
Admission: EM | Disposition: A | Discharge status: Skilled Nursing Facility | Payer: Self-pay | Source: Home / Self Care | Attending: Internal Medicine

## 2017-09-13 ENCOUNTER — Inpatient Hospital Stay
Admission: EM | Admit: 2017-09-13 | Discharge: 2017-09-16 | DRG: 982 | Disposition: A | Discharge status: Skilled Nursing Facility | Payer: Medicare Other | Attending: Internal Medicine | Admitting: Internal Medicine

## 2017-09-13 DIAGNOSIS — Z96652 Presence of left artificial knee joint: Secondary | ICD-10-CM | POA: Diagnosis present

## 2017-09-13 DIAGNOSIS — E785 Hyperlipidemia, unspecified: Secondary | ICD-10-CM | POA: Diagnosis present

## 2017-09-13 DIAGNOSIS — Z7902 Long term (current) use of antithrombotics/antiplatelets: Secondary | ICD-10-CM

## 2017-09-13 DIAGNOSIS — G2581 Restless legs syndrome: Secondary | ICD-10-CM | POA: Diagnosis present

## 2017-09-13 DIAGNOSIS — K922 Gastrointestinal hemorrhage, unspecified: Secondary | ICD-10-CM | POA: Diagnosis present

## 2017-09-13 DIAGNOSIS — F0280 Dementia in other diseases classified elsewhere without behavioral disturbance: Secondary | ICD-10-CM | POA: Diagnosis present

## 2017-09-13 DIAGNOSIS — G4730 Sleep apnea, unspecified: Secondary | ICD-10-CM | POA: Diagnosis present

## 2017-09-13 DIAGNOSIS — N3941 Urge incontinence: Secondary | ICD-10-CM | POA: Diagnosis present

## 2017-09-13 DIAGNOSIS — I700 Atherosclerosis of aorta: Secondary | ICD-10-CM | POA: Diagnosis present

## 2017-09-13 DIAGNOSIS — K5731 Diverticulosis of large intestine without perforation or abscess with bleeding: Secondary | ICD-10-CM | POA: Diagnosis not present

## 2017-09-13 DIAGNOSIS — Z8614 Personal history of Methicillin resistant Staphylococcus aureus infection: Secondary | ICD-10-CM

## 2017-09-13 DIAGNOSIS — F329 Major depressive disorder, single episode, unspecified: Secondary | ICD-10-CM | POA: Diagnosis present

## 2017-09-13 DIAGNOSIS — G309 Alzheimer's disease, unspecified: Secondary | ICD-10-CM | POA: Diagnosis present

## 2017-09-13 DIAGNOSIS — I480 Paroxysmal atrial fibrillation: Secondary | ICD-10-CM | POA: Diagnosis present

## 2017-09-13 DIAGNOSIS — Z79899 Other long term (current) drug therapy: Secondary | ICD-10-CM

## 2017-09-13 DIAGNOSIS — M542 Cervicalgia: Secondary | ICD-10-CM | POA: Diagnosis present

## 2017-09-13 DIAGNOSIS — K219 Gastro-esophageal reflux disease without esophagitis: Secondary | ICD-10-CM | POA: Diagnosis present

## 2017-09-13 DIAGNOSIS — I4891 Unspecified atrial fibrillation: Secondary | ICD-10-CM | POA: Diagnosis not present

## 2017-09-13 DIAGNOSIS — Z7982 Long term (current) use of aspirin: Secondary | ICD-10-CM

## 2017-09-13 DIAGNOSIS — M159 Polyosteoarthritis, unspecified: Secondary | ICD-10-CM | POA: Diagnosis present

## 2017-09-13 DIAGNOSIS — I482 Chronic atrial fibrillation: Secondary | ICD-10-CM | POA: Diagnosis present

## 2017-09-13 DIAGNOSIS — E039 Hypothyroidism, unspecified: Secondary | ICD-10-CM | POA: Diagnosis present

## 2017-09-13 DIAGNOSIS — Z885 Allergy status to narcotic agent status: Secondary | ICD-10-CM

## 2017-09-13 DIAGNOSIS — Z8619 Personal history of other infectious and parasitic diseases: Secondary | ICD-10-CM

## 2017-09-13 DIAGNOSIS — D500 Iron deficiency anemia secondary to blood loss (chronic): Secondary | ICD-10-CM

## 2017-09-13 DIAGNOSIS — Z8719 Personal history of other diseases of the digestive system: Secondary | ICD-10-CM

## 2017-09-13 DIAGNOSIS — I10 Essential (primary) hypertension: Secondary | ICD-10-CM | POA: Diagnosis present

## 2017-09-13 DIAGNOSIS — M810 Age-related osteoporosis without current pathological fracture: Secondary | ICD-10-CM | POA: Diagnosis present

## 2017-09-13 DIAGNOSIS — R402413 Glasgow coma scale score 13-15, at hospital admission: Secondary | ICD-10-CM | POA: Diagnosis present

## 2017-09-13 DIAGNOSIS — D62 Acute posthemorrhagic anemia: Secondary | ICD-10-CM | POA: Diagnosis present

## 2017-09-13 DIAGNOSIS — Z9049 Acquired absence of other specified parts of digestive tract: Secondary | ICD-10-CM

## 2017-09-13 DIAGNOSIS — Z87891 Personal history of nicotine dependence: Secondary | ICD-10-CM

## 2017-09-13 DIAGNOSIS — Z86010 Personal history of colonic polyps: Secondary | ICD-10-CM

## 2017-09-13 DIAGNOSIS — Z66 Do not resuscitate: Secondary | ICD-10-CM | POA: Diagnosis present

## 2017-09-13 HISTORY — PX: VISCERAL ANGIOGRAPHY: CATH118276

## 2017-09-13 LAB — CBC
HCT: 33.8 % — ABNORMAL LOW (ref 35.0–47.0)
HCT: 36.7 % (ref 35.0–47.0)
Hemoglobin: 11.5 g/dL — ABNORMAL LOW (ref 12.0–16.0)
Hemoglobin: 12.3 g/dL (ref 12.0–16.0)
MCH: 28.9 pg (ref 26.0–34.0)
MCH: 28.9 pg (ref 26.0–34.0)
MCHC: 34 g/dL (ref 32.0–36.0)
MCHC: 33.6 g/dL (ref 32.0–36.0)
MCV: 85.1 fL (ref 80.0–100.0)
MCV: 86 fL (ref 80.0–100.0)
PLATELETS: 143 10*3/uL — AB (ref 150–440)
PLATELETS: 134 10*3/uL — AB (ref 150–440)
RBC: 3.98 MIL/uL (ref 3.80–5.20)
RBC: 4.27 MIL/uL (ref 3.80–5.20)
RDW: 16 % — AB (ref 11.5–14.5)
RDW: 16 % — AB (ref 11.5–14.5)
WBC: 4.9 10*3/uL (ref 3.6–11.0)
WBC: 5.6 10*3/uL (ref 3.6–11.0)

## 2017-09-13 LAB — COMPREHENSIVE METABOLIC PANEL
ALK PHOS: 47 U/L (ref 38–126)
ALT: 18 U/L (ref 14–54)
AST: 24 U/L (ref 15–41)
Albumin: 4 g/dL (ref 3.5–5.0)
Anion gap: 6 (ref 5–15)
BILIRUBIN TOTAL: 0.7 mg/dL (ref 0.3–1.2)
BUN: 15 mg/dL (ref 6–20)
CALCIUM: 9 mg/dL (ref 8.9–10.3)
CO2: 26 mmol/L (ref 22–32)
Chloride: 106 mmol/L (ref 101–111)
Creatinine, Ser: 1.04 mg/dL — ABNORMAL HIGH (ref 0.44–1.00)
GFR calc Af Amer: 54 mL/min — ABNORMAL LOW (ref >60)
GFR calc non Af Amer: 47 mL/min — ABNORMAL LOW (ref >60)
Glucose, Bld: 97 mg/dL (ref 65–99)
POTASSIUM: 3.8 mmol/L (ref 3.5–5.1)
Sodium: 138 mmol/L (ref 135–145)
TOTAL PROTEIN: 7 g/dL (ref 6.5–8.1)

## 2017-09-13 LAB — PROTIME-INR
INR: 0.98
PROTHROMBIN TIME: 12.9 s (ref 11.4–15.2)

## 2017-09-13 LAB — CBC WITH DIFFERENTIAL/PLATELET
Basophils Absolute: 0 10*3/uL (ref 0–0.1)
Basophils Relative: 1 %
Eosinophils Absolute: 0.1 10*3/uL (ref 0–0.7)
Eosinophils Relative: 2 %
HEMATOCRIT: 37.7 % (ref 35.0–47.0)
Hemoglobin: 12.4 g/dL (ref 12.0–16.0)
Lymphocytes Relative: 33 %
Lymphs Abs: 1.6 10*3/uL (ref 1.0–3.6)
MCH: 28.2 pg (ref 26.0–34.0)
MCHC: 32.9 g/dL (ref 32.0–36.0)
MCV: 85.7 fL (ref 80.0–100.0)
MONO ABS: 0.4 10*3/uL (ref 0.2–0.9)
MONOS PCT: 9 %
NEUTROS ABS: 2.7 10*3/uL (ref 1.4–6.5)
Neutrophils Relative %: 55 %
Platelets: 153 10*3/uL (ref 150–440)
RBC: 4.39 MIL/uL (ref 3.80–5.20)
RDW: 16 % — AB (ref 11.5–14.5)
WBC: 4.9 10*3/uL (ref 3.6–11.0)

## 2017-09-13 LAB — MRSA PCR SCREENING: MRSA BY PCR: NEGATIVE

## 2017-09-13 LAB — APTT: aPTT: 37 seconds — ABNORMAL HIGH (ref 24–36)

## 2017-09-13 LAB — ABO/RH: ABO/RH(D): O POS

## 2017-09-13 SURGERY — VISCERAL ANGIOGRAPHY
Anesthesia: Moderate Sedation

## 2017-09-13 MED ORDER — TECHNETIUM TC 99M-LABELED RED BLOOD CELLS IV KIT
20.0000 | PACK | Freq: Once | INTRAVENOUS | Status: AC | PRN
  Administered 2017-09-13: 20.572 via INTRAVENOUS

## 2017-09-13 MED ORDER — MIDAZOLAM HCL 2 MG/2ML IJ SOLN
INTRAMUSCULAR | Status: DC | PRN
  Administered 2017-09-13: 1 mg via INTRAVENOUS
  Administered 2017-09-13: 0.5 mg via INTRAVENOUS

## 2017-09-13 MED ORDER — FENTANYL CITRATE (PF) 100 MCG/2ML IJ SOLN
INTRAMUSCULAR | Status: AC
  Filled 2017-09-13: qty 4

## 2017-09-13 MED ORDER — ONDANSETRON HCL 4 MG/2ML IJ SOLN
4.0000 mg | Freq: Four times a day (QID) | INTRAMUSCULAR | Status: DC | PRN
  Administered 2017-09-14: 4 mg via INTRAVENOUS
  Filled 2017-09-13: qty 2

## 2017-09-13 MED ORDER — PANTOPRAZOLE SODIUM 40 MG IV SOLR: 40.0000 mg | Freq: Two times a day (BID) | INTRAVENOUS | Status: DC

## 2017-09-13 MED ORDER — LIDOCAINE HCL (PF) 1 % IJ SOLN
INTRAMUSCULAR | Status: AC
  Filled 2017-09-13: qty 30

## 2017-09-13 MED ORDER — FENTANYL CITRATE (PF) 100 MCG/2ML IJ SOLN
INTRAMUSCULAR | Status: DC | PRN
  Administered 2017-09-13: 25 ug via INTRAVENOUS
  Administered 2017-09-13: 50 ug via INTRAVENOUS
  Administered 2017-09-13: 25 ug via INTRAVENOUS

## 2017-09-13 MED ORDER — SODIUM CHLORIDE 0.9 % IV SOLN
INTRAVENOUS | Status: DC
  Administered 2017-09-13 – 2017-09-14 (×3): via INTRAVENOUS

## 2017-09-13 MED ORDER — METOPROLOL TARTRATE 25 MG PO TABS
12.5000 mg | ORAL_TABLET | Freq: Two times a day (BID) | ORAL | Status: DC
  Administered 2017-09-13 – 2017-09-15 (×5): 12.5 mg via ORAL
  Filled 2017-09-13 (×6): qty 1

## 2017-09-13 MED ORDER — ACETAMINOPHEN 325 MG PO TABS: 650.0000 mg | ORAL_TABLET | Freq: Four times a day (QID) | ORAL | Status: DC | PRN

## 2017-09-13 MED ORDER — TRAZODONE HCL 50 MG PO TABS: 25.0000 mg | ORAL_TABLET | Freq: Every evening | ORAL | Status: DC | PRN

## 2017-09-13 MED ORDER — MIDAZOLAM HCL 5 MG/5ML IJ SOLN
INTRAMUSCULAR | Status: AC
  Filled 2017-09-13: qty 5

## 2017-09-13 MED ORDER — SODIUM CHLORIDE 0.9 % IV SOLN
10.0000 mL/h | Freq: Once | INTRAVENOUS | Status: AC
Start: 2017-09-13 — End: 2017-09-13
  Administered 2017-09-13: 10 mL/h via INTRAVENOUS

## 2017-09-13 MED ORDER — SODIUM CHLORIDE 0.9 % IV SOLN
80.0000 mg | Freq: Once | INTRAVENOUS | Status: AC
  Administered 2017-09-13: 80 mg via INTRAVENOUS
  Filled 2017-09-13: qty 80

## 2017-09-13 MED ORDER — HEPARIN (PORCINE) IN NACL 1000-0.9 UT/500ML-% IV SOLN
INTRAVENOUS | Status: AC
  Filled 2017-09-13: qty 1000

## 2017-09-13 MED ORDER — ONDANSETRON HCL 4 MG PO TABS: 4.0000 mg | ORAL_TABLET | Freq: Four times a day (QID) | ORAL | Status: DC | PRN

## 2017-09-13 MED ORDER — IOPAMIDOL (ISOVUE-300) INJECTION 61%
INTRAVENOUS | Status: DC | PRN
  Administered 2017-09-13: 30 mL via INTRA_ARTERIAL

## 2017-09-13 MED ORDER — CEFAZOLIN SODIUM-DEXTROSE 2-4 GM/100ML-% IV SOLN
2.0000 g | Freq: Once | INTRAVENOUS | Status: AC
  Administered 2017-09-13: 2 g via INTRAVENOUS

## 2017-09-13 MED ORDER — ACETAMINOPHEN 650 MG RE SUPP: 650.0000 mg | Freq: Four times a day (QID) | RECTAL | Status: DC | PRN

## 2017-09-13 SURGICAL SUPPLY — 16 items
BLOCK BEAD 300-500 MIC (Vascular Products) ×2 IMPLANT
CATH MICROCATH PRGRT 2.8F 110 (CATHETERS) IMPLANT
CATH PIG 70CM (CATHETERS) ×2 IMPLANT
CATH VS15FR (CATHETERS) ×2 IMPLANT
DEVICE STARCLOSE SE CLOSURE (Vascular Products) ×2 IMPLANT
DEVICE TORQUE .025-.038 (MISCELLANEOUS) ×2 IMPLANT
GLIDEWIRE STIFF .35X180X3 HYDR (WIRE) ×2 IMPLANT
MICROCATH PROGREAT 2.8F 110 CM (CATHETERS) ×3
NDL ENTRY 21GA 7CM ECHOTIP (NEEDLE) IMPLANT
NEEDLE ENTRY 21GA 7CM ECHOTIP (NEEDLE) ×3 IMPLANT
PACK ANGIOGRAPHY (CUSTOM PROCEDURE TRAY) ×3 IMPLANT
SET INTRO CAPELLA COAXIAL (SET/KITS/TRAYS/PACK) ×2 IMPLANT
SHEATH BRITE TIP 5FRX11 (SHEATH) ×3 IMPLANT
SYR MEDRAD MARK V 150ML (SYRINGE) ×2 IMPLANT
TUBING CONTRAST HIGH PRESS 72 (TUBING) ×3 IMPLANT
WIRE J 3MM .035X145CM (WIRE) ×3 IMPLANT

## 2017-09-13 NOTE — Op Note (Signed)
[facility] Percutaneous Study/Intervention Procedural Note     Surgeon(s): Nurse, children's: none  Pre-operative Diagnosis: 1. Lower GI bleed 2.  Atrial fibrillation on Plavix and aspirin  Post-operative diagnosis: Same  Procedure(s) Performed: 1. Ultrasound guidance for vascular access right femoral artery 2. Catheter placement into IMA 3. Aortogram and selective angiogram of the IMA 4. Microbead embolization of the IMA with 300-500  polyvinyl alcohol beads. 5. StarClose closure device right femoral artery  Anesthesia: Moderate conscious sedation for approximately 25 minutes using 1 mg of Versed and 50 Mcg of Fentanyl  EBL: Less than 10 cc  Fluoro Time: 3.5 minutes  Contrast: 30 cc  Indications: Patient is a 84 y.o.female with brisk lower GI bleeding with resultant anemia. The patient has a nuclear medicine study showing bleeding in the sigmoid colon. The patient is brought in for angiography for further evaluation and potential treatment. Risks and benefits are discussed and informed consent is obtained  Procedure: The patient was identified and appropriate procedural time out was performed. The patient was then placed supine on the table and prepped and draped in the usual sterile fashion.Moderate conscious sedation was administered during a face to face encounter with the patient throughout the procedure with my supervision of the RN administering medicines and monitoring the patient's vital signs, pulse oximetry, telemetry and mental status throughout from the start of the procedure until the patient was taken to the recovery room.  Ultrasound was used to evaluate the right common femoral artery. It was patent . A digital ultrasound image was acquired. A micropuncture needle was used to access the right common femoral artery under direct  ultrasound guidance and a permanent image was performed. Microwire was then advanced without difficulty followed by a micro sheath.  A 0.035 J wire was advanced without resistance and a 5Fr sheath was placed. Pigtail catheter was placed into the aorta and an AP aortogram was performed. This demonstrated the IMA is patent and the approximate location of the origin.  I then transitioned to the right lateral projection and repeated the study.  This allowed me to cannulate the IMA. A V S1 catheter was used to selectively cannulate the IMA.  This demonstrated filling of the sigmoid arcade, a small blush did appear in the mid one third of the sigmoid colon. Based on her continued bleeding and the nuclear medicine study I elected to treat this area with embolization. I initially advanced the Pro-Great microcatheter out the third order branches of the IMA and instilled approximately 1 cc of 300 to 500 m PVC beads in this location. Angiogram following this showed the main vessels to be open with less brisk filling. I then advanced the Pro-great microcatheter without difficulty to the area where I thought a subtle blush existed. Selective imaging was performed.  An additional half cc of polyvinyl alcohol beads were deployed in the distal IMA. Again, completion angiogram showed the main vessels to be open with less brisk filling. I elected to terminate the procedure. The diagnostic catheter was removed. StarClose closure device was deployed in usual fashion with excellent hemostatic result. The patient was taken to the recovery room in stable condition having tolerated the procedure well.     Findings:Diffuse atherosclerosis of the abdominal aorta but there are no hemodynamically significant lesions.  Aortic bifurcation is widely patent as is the common and external iliac arteries.  IMA is identified with mild narrowing at its origin it is best seen in the RAO projection.  Selective imaging of the IMA demonstrates  normal-appearing arborization throughout the sigmoid with an area in the mid one third that appears to have a subtle blush.  A total of 1.5 cc of beads was deployed into the sigmoid distribution.  Follow-up imaging demonstrated a significant reduction in the brisk flow as well as elimination of the blush  Disposition: Patient was taken to the recovery room in stable condition having tolerated the procedure well.  Complications: None  Hortencia Pilar 09/13/2017 5:53 PM   This note was created with Dragon Medical transcription system. Any errors in dictation are purely unintentional.

## 2017-09-13 NOTE — ED Notes (Signed)
Pt returned from Nuclear Medicine Scan at this time.

## 2017-09-13 NOTE — Progress Notes (Signed)
MD Vianne Bulls was notified that pt nm blood loss study was positive. GI was consulted and vascular surgery consult was added. Family updated on plan of care. No further orders at this time.

## 2017-09-13 NOTE — H&P (Signed)
[facility] NAME: Kaitlyn Ramos    MR#:  [id number]  DATE OF BIRTH:  04-19-1927  DATE OF ADMISSION:  09/13/2017  PRIMARY CARE PHYSICIAN: Kaitlyn Carbon, MD   REQUESTING/REFERRING PHYSICIAN: Dr. Merlyn Ramos  CHIEF COMPLAINT: Rectal bleed   Chief Complaint  Patient presents with  . GI Bleeding    HISTORY OF PRESENT ILLNESS:  Kaitlyn Ramos  is a 84 y.o. female with a known history of Alzheimer's dementia, brought from to [facility] home because of several episodes of large blood clots after having a BM.  Patient had 2 large episodes of rectal bleed in the emergency room.  She was taking aspirin, Plavix for atrial fibrillation.  Patient complains of dizziness but she has dizziness chronically and denies acute dizziness.  No abdominal pain, nausea.  Hemodynamically stable initial hemoglobin also stable and 12.4.  ER doctor spoke with Dr. Venia Ramos from gastroenterology who recommended bleeding scan. PAST MEDICAL HISTORY:   Past Medical History:  Diagnosis Date  . Cervicalgia   . Depression   . Essential hypertension   . Hemorrhoids   . History of MRSA infection   . Hyperlipidemia   . Hypertension   . Hypothyroidism   . IBS (irritable bowel syndrome)   . Osteoarthritis of multiple joints   . Restless leg   . Senile osteoporosis   . Shingles   . Sleep apnea   . Ulcerative colitis ([place])   . Urge incontinence     PAST SURGICAL HISTOIRY:   Past Surgical History:  Procedure Laterality Date  . ABDOMINAL HYSTERECTOMY  1980  . cataract surgery    . CHOLECYSTECTOMY  1955  . colon polyp removal  12/2012  . ESOPHAGOGASTRODUODENOSCOPY  2014  . makoplasty Left 06/2012  . parotid gland removal  1984    SOCIAL HISTORY:   Social History   Tobacco Use  . Smoking status: Former Research scientist (life sciences)  . Smokeless tobacco: Former Systems developer  . Tobacco comment: quit at age 89  Substance Use Topics  . Alcohol use: No     Alcohol/week: 0.0 oz    Comment: occasional    FAMILY HISTORY:   Family History  Problem Relation Age of Onset  . Breast cancer Daughter   . Colon cancer Maternal Grandfather   . Hypertension Father   . Dementia Father   . Arthritis Mother   . Osteoporosis Mother   . Kidney disease Neg Hx   . Bladder Cancer Neg Hx     DRUG ALLERGIES:   Allergies  Allergen Reactions  . Codeine Sulfate Hives and Itching    Can take tramadol  . Codeine Other (See Comments)    REVIEW OF SYSTEMS:  CONSTITUTIONAL: No fever, fatigue or weakness.  EYES: No blurred or double vision.  EARS, NOSE, AND THROAT: No tinnitus or ear pain.  RESPIRATORY: No cough, shortness of breath, wheezing or hemoptysis.  CARDIOVASCULAR: No chest pain, orthopnea, edema.  GASTROINTESTINAL: 3 episodes of rectal bleed.   GENITOURINARY: No dysuria, hematuria.  ENDOCRINE: No polyuria, nocturia,  HEMATOLOGY: No anemia, easy bruising or bleeding SKIN: No rash or lesion. MUSCULOSKELETAL: No joint pain or arthritis.   NEUROLOGIC: No tingling, numbness, weakness.  PSYCHIATRY: No anxiety or depression.   MEDICATIONS AT HOME:   Prior to Admission medications   Medication Sig Start Date End Date Taking? Authorizing Provider  acetaminophen (TYLENOL) 325 MG tablet Take 650 mg by mouth every 8 (eight) hours as needed for mild  pain or moderate pain.   Yes [provider]  aspirin EC 81 MG EC tablet Take 1 tablet (81 mg total) by mouth daily. 04/07/17  Yes Henreitta Leber, MD  atorvastatin (LIPITOR) 40 MG tablet Take 1 tablet (40 mg total) by mouth daily at 6 PM. 04/06/17  Yes Sainani, Belia Heman, MD  Calcium Carbonate-Vitamin D (CALCIUM 500 + D) 500-125 MG-UNIT TABS Take 1 tablet by mouth daily.    Yes [provider]  Cholecalciferol (D3-50) 50000 units capsule Take 50,000 Units by mouth every 30 (thirty) days.   Yes [provider]  clopidogrel (PLAVIX) 75 MG tablet Take 1 tablet (75 mg total) by mouth  daily. 04/07/17  Yes Henreitta Leber, MD  cyanocobalamin 1000 MCG tablet Take 1,000 mcg by mouth daily.   Yes [provider]  DULoxetine (CYMBALTA) 30 MG capsule Take 30 mg by mouth daily.   Yes [provider]  fluticasone (FLONASE) 50 MCG/ACT nasal spray Place 2 sprays into both nostrils daily.   Yes [provider]  levothyroxine (SYNTHROID, LEVOTHROID) 50 MCG tablet Take 50 mcg by mouth daily before breakfast.   Yes [provider]  losartan (COZAAR) 50 MG tablet Take 50 mg by mouth daily.    Yes [provider]  Multiple Vitamins-Minerals (MULTIVITAMIN ADULT PO) Take 1 tablet by mouth daily.    Yes [provider]  metoCLOPramide (REGLAN) 10 MG tablet Take 1 tablet (10 mg total) by mouth every 8 (eight) hours as needed. Patient not taking: Reported on 09/13/2017 12/10/16   Loney Hering, MD  metoprolol tartrate (LOPRESSOR) 25 MG tablet Take 0.5 tablets (12.5 mg total) by mouth 2 (two) times daily. Patient not taking: Reported on 09/13/2017 04/06/17   Henreitta Leber, MD  ondansetron (ZOFRAN ODT) 4 MG disintegrating tablet Take 1 tablet (4 mg total) by mouth every 8 (eight) hours as needed for nausea or vomiting. 11/11/16   Harvest Dark, MD  VESICARE 5 MG tablet TAKE 1 TABLET BY MOUTH ONCE A DAY Patient not taking: Reported on 09/13/2017 02/15/17   Zara Council A, PA-C      VITAL SIGNS:  Blood pressure (!) 150/95, pulse 69, temperature 97.9 F (36.6 C), temperature source Oral, resp. rate 19, height 5\' 3"  (1.6 m), weight 63.5 kg (140 lb), SpO2 93 %.  PHYSICAL EXAMINATION:  GENERAL:  84 y.o.-year-old patient lying in the bed with no acute distress.  EYES: Pupils equal, round, reactive to light and accommodation. No scleral icterus. Extraocular muscles intact.  HEENT: Head atraumatic, normocephalic. Oropharynx and nasopharynx clear.  NECK:  Supple, no jugular venous distention. No thyroid enlargement, no tenderness.   LUNGS: Normal breath sounds bilaterally, no wheezing, rales,rhonchi or crepitation. No use of accessory muscles of respiration.  CARDIOVASCULAR: S1, S2 normal. No murmurs, rubs, or gallops.  ABDOMEN: Soft, nontender, nondistended. Bowel sounds present. No organomegaly or mass.  EXTREMITIES: No pedal edema, cyanosis, or clubbing.  NEUROLOGIC: Cranial nerves II through XII are intact. Muscle strength 5/5 in all extremities. Sensation intact. Gait not checked.  PSYCHIATRIC: The patient is alert and oriented x 3.  SKIN: No obvious rash, lesion, or ulcer.   LABORATORY PANEL:   CBC Recent Labs  Lab 09/13/17 0855  WBC 4.9  HGB 12.4  HCT 37.7  PLT 153   ------------------------------------------------------------------------------------------------------------------  Chemistries  Recent Labs  Lab 09/13/17 0855  NA 138  K 3.8  CL 106  CO2 26  GLUCOSE 97  BUN 15  CREATININE  1.04*  CALCIUM 9.0  AST 24  ALT 18  ALKPHOS 47  BILITOT 0.7   ------------------------------------------------------------------------------------------------------------------  Cardiac Enzymes No results for input(s): TROPONINI in the last 168 hours. ------------------------------------------------------------------------------------------------------------------  RADIOLOGY:  No results found.  EKG:   Orders placed or performed during the hospital encounter of 09/13/17  . ED EKG  . ED EKG    IMPRESSION AND PLAN:   84 year old female patient with history of Alzheimer's dementia brought from [place] home because of rectal bleeding, patient surprisingly hemodynamically stable with stable hemoglobin 12.7.  But she had 2 large episodes of rectal bleed in the emergency room so concerning that we are giving 1 unit of blood transfusion. 1.  Rectal bleed, patient hemodynamically stable, patient is getting nuclear medicine bleeding scan, check CBC every 6 hours, continue IV fluids, n.p.o., GI consult  for rectal bleeding.  Differential diagnoses include diverticular bleed, ischemic colitis.  Patient is hemodynamically stable so started as observation status can change to inpatient if needed.  Hold aspirin, Plavix due to GI bleed. 2.  History of paroxysmal atrial fibrillation, hold aspirin, Plavix, because of n.p.o. hold all p.o. medicines.  Use IV metoprolol 2.5 mg every 6 hours as needed if the bed heart rate is more than 130.  But call MD. 3.  Dementia, patient is from [place] home side.  CODE STATUS DNR. 4.  History of restless leg syndrome, hold Requip because she is n.p.o.    All the records are reviewed and case discussed with ED provider. Management plans discussed with the patient, family and they are in agreement.  CODE STATUS: DNR TOTAL TIME TAKING CARE OF THIS PATIENT: 60minutes.    Epifanio Lesches M.D on 09/13/2017 at 12:02 PM  Between 7am to 6pm - Pager - [phone]  After 6pm go to [email or web address] - password [email or web address] [facility]  Office  [phone]  CC: Primary care physician; Kaitlyn Carbon, MD  Note: This dictation was prepared with Dragon dictation along with smaller phrase technology. Any transcriptional errors that result from this process are unintentional.

## 2017-09-13 NOTE — Consult Note (Signed)
[facility] Vascular Consult Note  MRN : [id number]  Kaitlyn Ramos is a 84 y.o. (Dec 13, 1927) female who presents with chief complaint of  Chief Complaint  Patient presents with  . GI Bleeding  .  History of Present Illness:   I am asked to evaluate the patient for GI bleed by Geanie Kenning, PA-C.  The patient is a 84 y.o. female with a known history of Alzheimer's dementia, brought from [place] home because of several episodes of bloody BMs.  Patient had 2 large episodes of rectal bleed in the emergency room.  Bleeding scan was obtained which demonstrated findings consistent with the sigmoid colon as the source.  Given the patient's dementia her history is difficult to obtain but there does not appear to have been any pain associated with the onset of her bloody bowel movements.  No fever chills.    Current Meds  Medication Sig  . acetaminophen (TYLENOL) 325 MG tablet Take 650 mg by mouth every 8 (eight) hours as needed for mild pain or moderate pain.  Marland Kitchen aspirin EC 81 MG EC tablet Take 1 tablet (81 mg total) by mouth daily.  Marland Kitchen atorvastatin (LIPITOR) 40 MG tablet Take 1 tablet (40 mg total) by mouth daily at 6 PM.  . Calcium Carbonate-Vitamin D (CALCIUM 500 + D) 500-125 MG-UNIT TABS Take 1 tablet by mouth daily.   . Cholecalciferol (D3-50) 50000 units capsule Take 50,000 Units by mouth every 30 (thirty) days.  . clopidogrel (PLAVIX) 75 MG tablet Take 1 tablet (75 mg total) by mouth daily.  . cyanocobalamin 1000 MCG tablet Take 1,000 mcg by mouth daily.  . DULoxetine (CYMBALTA) 30 MG capsule Take 30 mg by mouth daily.  . fluticasone (FLONASE) 50 MCG/ACT nasal spray Place 2 sprays into both nostrils daily.  Marland Kitchen levothyroxine (SYNTHROID, LEVOTHROID) 50 MCG tablet Take 50 mcg by mouth daily before breakfast.  . losartan (COZAAR) 50 MG tablet Take 50 mg by mouth daily.   . Multiple Vitamins-Minerals (MULTIVITAMIN ADULT PO) Take 1 tablet by mouth  daily.     Past Medical History:  Diagnosis Date  . Cervicalgia   . Depression   . Essential hypertension   . Hemorrhoids   . History of MRSA infection   . Hyperlipidemia   . Hypertension   . Hypothyroidism   . IBS (irritable bowel syndrome)   . Osteoarthritis of multiple joints   . Restless leg   . Senile osteoporosis   . Shingles   . Sleep apnea   . Ulcerative colitis ([place])   . Urge incontinence     Past Surgical History:  Procedure Laterality Date  . ABDOMINAL HYSTERECTOMY  1980  . cataract surgery    . CHOLECYSTECTOMY  1955  . colon polyp removal  12/2012  . ESOPHAGOGASTRODUODENOSCOPY  2014  . makoplasty Left 06/2012  . parotid gland removal  1984    Social History Social History   Tobacco Use  . Smoking status: Former Research scientist (life sciences)  . Smokeless tobacco: Former Systems developer  . Tobacco comment: quit at age 68  Substance Use Topics  . Alcohol use: No    Alcohol/week: 0.0 oz    Comment: occasional  . Drug use: No    Family History Family History  Problem Relation Age of Onset  . Breast cancer Daughter   . Colon cancer Maternal Grandfather   . Hypertension Father   . Dementia Father   . Arthritis Mother   . Osteoporosis Mother   .  Kidney disease Neg Hx   . Bladder Cancer Neg Hx   No family history of bleeding/clotting disorders, porphyria or autoimmune disease   Allergies  Allergen Reactions  . Codeine Sulfate Hives and Itching    Can take tramadol  . Codeine Other (See Comments)     REVIEW OF SYSTEMS (Negative unless checked)  Constitutional: [] Weight loss  [] Fever  [] Chills Cardiac: [] Chest pain   [] Chest pressure   [] Palpitations   [] Shortness of breath when laying flat   [] Shortness of breath at rest   [] Shortness of breath with exertion. Vascular:  [] Pain in legs with walking   [] Pain in legs at rest   [] Pain in legs when laying flat   [] Claudication   [] Pain in feet when walking  [] Pain in feet at rest  [] Pain in feet when laying flat   [] History of  DVT   [] Phlebitis   [] Swelling in legs   [] Varicose veins   [] Non-healing ulcers Pulmonary:   [] Uses home oxygen   [] Productive cough   [] Hemoptysis   [] Wheeze  [] COPD   [] Asthma Neurologic:  [] Dizziness  [] Blackouts   [] Seizures   [x] History of stroke   [] History of TIA  [] Aphasia   [] Temporary blindness   [] Dysphagia   [] Weakness or numbness in arms   [] Weakness or numbness in legs Musculoskeletal:  [] Arthritis   [] Joint swelling   [] Joint pain   [] Low back pain Hematologic:  [] Easy bruising  [] Easy bleeding   [] Hypercoagulable state   [] Anemic  [] Hepatitis Gastrointestinal:  [x] Blood in stool   [] Vomiting blood  [] Gastroesophageal reflux/heartburn   [] Difficulty swallowing. Genitourinary:  [] Chronic kidney disease   [] Difficult urination  [] Frequent urination  [] Burning with urination   [] Blood in urine Skin:  [] Rashes   [] Ulcers   [] Wounds Psychological:  [] History of anxiety   []  History of major depression.  Physical Examination  Vitals:   09/13/17 1710 09/13/17 1715 09/13/17 1720 09/13/17 1725  BP:      Pulse:      Resp:      Temp:      TempSrc:      SpO2: 96% 96% 96% 97%  Weight:      Height:       Body mass index is 24.32 kg/m. Gen:  WD/WN, NAD Head: [place]/AT, No temporalis wasting. Prominent temp pulse not noted. Ear/Nose/Throat: Hearing grossly intact, nares w/o erythema or drainage, oropharynx w/o Erythema/Exudate Eyes: PERRLA, EOMI.  Neck: Supple, no nuchal rigidity.  No bruit or JVD.  Pulmonary:  Good air movement, clear to auscultation bilaterally.  Cardiac: RRR, normal S1, S2, no Murmurs, rubs or gallops. Vascular:  Vessel Right Left  Radial Palpable Palpable  Gastrointestinal: soft, non-tender/non-distended. No guarding/reflex. No masses, surgical incisions, or scars. Musculoskeletal: M/S 5/5 throughout.  Extremities without ischemic changes.  No deformity or atrophy. No edema. Neurologic: CN 2-12 intact. Pain and light touch intact in extremities.  Symmetrical.   Speech is fluent. Motor exam as listed above. Psychiatric: Judgment poor, Mood & affect appropriate for pt's clinical situation. Dermatologic: No rashes or ulcers noted.  No cellulitis or open wounds. Lymph : No Cervical, Axillary, or Inguinal lymphadenopathy.   CBC Lab Results  Component Value Date   WBC 4.9 09/13/2017   HGB 11.5 (L) 09/13/2017   HCT 33.8 (L) 09/13/2017   MCV 85.1 09/13/2017   PLT 143 (L) 09/13/2017    BMET    Component Value Date/Time   NA 138 09/13/2017 0855   NA 137 03/05/2013 1507  K 3.8 09/13/2017 0855   K 3.9 03/05/2013 1507   CL 106 09/13/2017 0855   CL 106 03/05/2013 1507   CO2 26 09/13/2017 0855   CO2 26 03/05/2013 1507   GLUCOSE 97 09/13/2017 0855   GLUCOSE 79 03/05/2013 1507   BUN 15 09/13/2017 0855   BUN 16 03/05/2013 1507   CREATININE 1.04 (H) 09/13/2017 0855   CREATININE 0.85 03/05/2013 1507   CALCIUM 9.0 09/13/2017 0855   CALCIUM 8.5 03/05/2013 1507   GFRNONAA 47 (L) 09/13/2017 0855   GFRNONAA >60 03/05/2013 1507   GFRAA 54 (L) 09/13/2017 0855   GFRAA >60 03/05/2013 1507   Estimated Creatinine Clearance: 31.5 mL/min (A) (by C-G formula based on SCr of 1.04 mg/dL (H)).  COAG Lab Results  Component Value Date   INR 0.98 09/13/2017    Radiology Nm Gi Blood Loss  Result Date: 09/13/2017 CLINICAL DATA:  Gastrointestinal bleeding EXAM: NUCLEAR MEDICINE GASTROINTESTINAL BLEEDING SCAN TECHNIQUE: Sequential frontal abdominal and pelvic images were obtained following intravenous administration of Tc-65m labeled red blood cells. RADIOPHARMACEUTICALS:  20.572 mCi Tc-50m pertechnetate in-vitro labeled red cells. COMPARISON:  None. FINDINGS: There is an area of ectopic radiotracer uptake just to the left of midline in the upper pelvic region. This area became more prominent over time. After defecating, this area of radiotracer no longer became apparent. Additional imaging was performed with return of radiotracer uptake in the upper pelvis  slightly to the left in the midline. This appearance is felt to represent ectopic bleeding site in the sigmoid colon region. IMPRESSION: Abnormal radiotracer uptake in the upper pelvis as described, felt to represent an intermittently active bleeding site in the sigmoid colon region. Electronically Signed   By: [name] III M.D.   On: 09/13/2017 14:02     Assessment/Plan 1.  GI bleed localized to the sigmoid colon: Patient should undergo embolization with the hope of preventing her limiting further blood loss.  The risks and benefits have been discussed in detail with Ms. Rowan Blase the patient's daughter.  This was performed by phone.  I have reviewed the clinical situation with her the indications for embolization as well as the risks and benefits benefits as noted above.  She requests that we move forward with embolization.  2.  Anemia of blood loss: Patient is currently undergoing blood transfusion.  H&H will be followed serially by the hospitalists.  Further transfusions will be based on the patient's clinical situation as well as her hemoglobin.  3.  History of paroxysmal atrial fibrillation:  Hold aspirin, Plavix, because of n.p.o. hold all p.o. medicines.  Use IV metoprolol 2.5 mg every 6 hours as needed if the bed heart rate is more than 130.    4.  Dementia:  Patient is from [place] home with memory care.   Hortencia Pilar, MD  09/13/2017 5:39 PM

## 2017-09-13 NOTE — ED Notes (Signed)
Junious Dresser, RN aware of patient's BP at time of transfer to floor. Pt taken to 2A room 250 at this time by Dorian, EDT.

## 2017-09-13 NOTE — Care Management Obs Status (Signed)
[facility] NOTIFICATION   Patient Details  Name: Kaitlyn Ramos MRN: [id number] Date of Birth: [birth year]   Medicare Observation Status Notification Given:  Yes  Daughter Rowan Blase notified.  Marshell Garfinkel, RN 09/13/2017, 11:52 AM

## 2017-09-13 NOTE — Consult Note (Signed)
GI Inpatient Consult Note  Reason for Consult: GI Bleed   Attending Requesting Consult: Dr. Epifanio Lesches, MD  History of Present Illness: Kaitlyn Ramos is a 84 y/o Caucasian female with a PMH of Alzheimer's dementia, ulcerative colitis in remission, hypertension presented to the ED this morning via EMS from [facility] after multiple episodes of passing large blood clots while having a BM. Pt had 2 episodes of bright red blood per rectum in the ED and one episode of melena once admitted on the floor. Patient has been on aspirin and Plavix for atrial fibrillation. Tagged RBC scan was completed and showed evidence of intermittently active bleeding site in the sigmoid colon region. Hemoglobin 12.4.   Patient seen and examined in hospital bed in no acute distress. Pt was having episode of maroon blood intermixed in stool when I entered the room. Pt denies any abdominal pain. She has remained NPO since coming to the ED. Due to her advanced dementia, she is unable to provide much past medical history. She denies any nausea, vomiting, dysphagia, or abdominal pain. Denies frequent NSAID use.    Last Colonoscopy: 2007 - ulcerative colitis in remission and with diverticulosis Last Endoscopy: 2007 - gastritis    Past Medical History:  Past Medical History:  Diagnosis Date  . Cervicalgia   . Depression   . Essential hypertension   . Hemorrhoids   . History of MRSA infection   . Hyperlipidemia   . Hypertension   . Hypothyroidism   . IBS (irritable bowel syndrome)   . Osteoarthritis of multiple joints   . Restless leg   . Senile osteoporosis   . Shingles   . Sleep apnea   . Ulcerative colitis ([place])   . Urge incontinence     Problem List: Patient Active Problem List   Diagnosis Date Noted  . Acute lower GI bleeding 09/13/2017  . Acute encephalopathy   . CVA (cerebral vascular accident) ([place]) 04/03/2017  . Syncope 03/03/2016  . Pain in shoulder 11/25/2014  . Impingement syndrome of  shoulder 11/25/2014  . Bilirubin in urine 10/04/2014  . Recurrent UTI 09/28/2014  . Urge incontinence 09/28/2014  . Microscopic hematuria 09/28/2014  . Cervical pain 09/28/2014  . Degenerative joint disease involving multiple joints 09/28/2014  . Involutional osteoporosis 09/28/2014  . Adult hypothyroidism 09/28/2014  . Acute low back pain 08/26/2014  . Arthralgia of hip 08/26/2014  . Combined fat and carbohydrate induced hyperlipemia 01/23/2014  . BP (high blood pressure) 01/23/2014  . Apnea, sleep 01/23/2014    Past Surgical History: Past Surgical History:  Procedure Laterality Date  . ABDOMINAL HYSTERECTOMY  1980  . cataract surgery    . CHOLECYSTECTOMY  1955  . colon polyp removal  12/2012  . ESOPHAGOGASTRODUODENOSCOPY  2014  . makoplasty Left 06/2012  . parotid gland removal  1984    Allergies: Allergies  Allergen Reactions  . Codeine Sulfate Hives and Itching    Can take tramadol  . Codeine Other (See Comments)    Home Medications: Medications Prior to Admission  Medication Sig Dispense Refill Last Dose  . acetaminophen (TYLENOL) 325 MG tablet Take 650 mg by mouth every 8 (eight) hours as needed for mild pain or moderate pain.   PRN at PRN  . aspirin EC 81 MG EC tablet Take 1 tablet (81 mg total) by mouth daily.   09/12/2017 at Unknown time  . atorvastatin (LIPITOR) 40 MG tablet Take 1 tablet (40 mg total) by mouth daily at 6 PM.  09/12/2017 at Unknown time  . Calcium Carbonate-Vitamin D (CALCIUM 500 + D) 500-125 MG-UNIT TABS Take 1 tablet by mouth daily.    09/12/2017 at Unknown time  . Cholecalciferol (D3-50) 50000 units capsule Take 50,000 Units by mouth every 30 (thirty) days.   Past Month at Unknown time  . clopidogrel (PLAVIX) 75 MG tablet Take 1 tablet (75 mg total) by mouth daily.   09/12/2017 at Unknown time  . cyanocobalamin 1000 MCG tablet Take 1,000 mcg by mouth daily.   09/12/2017 at Unknown time  . DULoxetine (CYMBALTA) 30 MG capsule Take 30 mg by mouth  daily.   09/12/2017 at Unknown time  . fluticasone (FLONASE) 50 MCG/ACT nasal spray Place 2 sprays into both nostrils daily.   09/12/2017 at Unknown time  . levothyroxine (SYNTHROID, LEVOTHROID) 50 MCG tablet Take 50 mcg by mouth daily before breakfast.   09/12/2017 at Unknown time  . losartan (COZAAR) 50 MG tablet Take 50 mg by mouth daily.    09/12/2017 at Unknown time  . Multiple Vitamins-Minerals (MULTIVITAMIN ADULT PO) Take 1 tablet by mouth daily.    09/12/2017 at Unknown time  . metoCLOPramide (REGLAN) 10 MG tablet Take 1 tablet (10 mg total) by mouth every 8 (eight) hours as needed. (Patient not taking: Reported on 09/13/2017) 20 tablet 0 Not Taking at Unknown time  . metoprolol tartrate (LOPRESSOR) 25 MG tablet Take 0.5 tablets (12.5 mg total) by mouth 2 (two) times daily. (Patient not taking: Reported on 09/13/2017)   Not Taking at Unknown time  . ondansetron (ZOFRAN ODT) 4 MG disintegrating tablet Take 1 tablet (4 mg total) by mouth every 8 (eight) hours as needed for nausea or vomiting. 20 tablet 0 PRN at PRN  . VESICARE 5 MG tablet TAKE 1 TABLET BY MOUTH ONCE A DAY (Patient not taking: Reported on 09/13/2017) 30 tablet 3 Not Taking at Unknown time   Home medication reconciliation was completed with the patient.   Scheduled Inpatient Medications:   . [START ON 09/16/2017] pantoprazole  40 mg Intravenous Q12H    Continuous Inpatient Infusions:   . sodium chloride    . sodium chloride      PRN Inpatient Medications:  acetaminophen **OR** acetaminophen, ondansetron **OR** ondansetron (ZOFRAN) IV, traZODone  Family History: family history includes Arthritis in her mother; Breast cancer in her daughter; Colon cancer in her maternal grandfather; Dementia in her father; Hypertension in her father; Osteoporosis in her mother.  The patient's family history is negative for inflammatory bowel disorders, GI malignancy, or solid organ transplantation.  Social History:   reports that she has quit  smoking. She has quit using smokeless tobacco. She reports that she does not drink alcohol or use drugs. The patient denies ETOH, tobacco, or drug use.   Review of Systems: Constitutional: Weight is stable.  Eyes: No changes in vision. ENT: No oral lesions, sore throat.  GI: see HPI.  Heme/Lymph: No easy bruising.  CV: No chest pain.  GU: No hematuria.  Integumentary: No rashes.  Neuro: No headaches.  Psych: No depression/anxiety.  Endocrine: No heat/cold intolerance.  Allergic/Immunologic: No urticaria.  Resp: No cough, SOB.  Musculoskeletal: No joint swelling.    Physical Examination: BP 115/84 (BP Location: Right Arm)   Pulse 81   Temp 98.1 F (36.7 C) (Oral)   Resp 18   Ht 5\' 3"  (1.6 m)   Wt 62.3 kg (137 lb 4.8 oz)   SpO2 96%   BMI 24.32 kg/m  Pleasant, elderly female resting  comfortably in hospital bed. Pt having episode of maroon blood intermixed in the stool while I was in room. Difficulty answering questions.  Gen: NAD, alert and oriented x 4 HEENT: PEERLA, EOMI, Neck: supple, no JVD or thyromegaly Chest: CTA bilaterally, no wheezes, crackles, or other adventitious sounds CV: RRR, no m/g/c/r Abd: soft, NT, ND, +BS in all four quadrants; no HSM, guarding, ridigity, or rebound tenderness Ext: no edema, well perfused with 2+ pulses, Skin: no rash or lesions noted Lymph: no LAD  Data: Lab Results  Component Value Date   WBC 4.9 09/13/2017   HGB 12.4 09/13/2017   HCT 37.7 09/13/2017   MCV 85.7 09/13/2017   PLT 153 09/13/2017   Recent Labs  Lab 09/13/17 0855  HGB 12.4   Lab Results  Component Value Date   NA 138 09/13/2017   K 3.8 09/13/2017   CL 106 09/13/2017   CO2 26 09/13/2017   BUN 15 09/13/2017   CREATININE 1.04 (H) 09/13/2017   Lab Results  Component Value Date   ALT 18 09/13/2017   AST 24 09/13/2017   ALKPHOS 47 09/13/2017   BILITOT 0.7 09/13/2017   Recent Labs  Lab 09/13/17 0855  APTT 37*  INR 0.98   Assessment/Plan: Ms.  Bonenfant is a 84 y.o. female with a PMH of ulcerative colitis in remission, atrial fibrillation on Plavix and aspirin, and diverticulosis presents to hospital secondary to hematochezia found to have active bleed in sigmoid colon.   1. Acute GI Bleed: - Tagged RBC scan showed evidence of intermittently active bleeding in the sigmoid colon - Patient remains hemodynamically stable, hemoglobin 12.4. Continue to monitor Hg q12h. - Recommend STAT vascular consult. Phone call has been placed with vascular surgeon on call and I spoke with him directly - Patient is not a luminal candidate for colonoscopy at this time. Recommend vascular surgery consult for possible intervention.   Recommendations: - STAT vascular consult for possible intervention   Thank you for the consult. Please call with questions or concerns.  Geanie Kenning, PA-C [place]

## 2017-09-13 NOTE — ED Triage Notes (Signed)
Pt presents to ED from [facility] via [place]. Per EMS pt began passing large blood clots, EMS reports approx the size of palm, this morning while on the toilet. EMS reports dark blood noted. Pt currently on aspirin and plavix.

## 2017-09-13 NOTE — ED Provider Notes (Signed)
[facility] Emergency Department Provider Note    First MD Initiated Contact with Patient 09/13/17 [phone]     (approximate)  I have reviewed the triage vital signs and the nursing notes.   HISTORY  Chief Complaint GI Bleeding  Level V Caveat: advanced dementia  HPI Kaitlyn Ramos is a 84 y.o. female [facility] after reportedly having several episodes of large blood clots in the toilet after having a bowel movement.  Patient is on aspirin and Plavix.  Patient does suffer from advanced dementia she is unable to provide much additional history.  Denies any pain at this time.  No nausea or vomiting.  EGD 2007 found gastritis Colonoscopy 2007  With ulcerative colitis in remission and with diverticulosis  Past Medical History:  Diagnosis Date  . Cervicalgia   . Depression   . Essential hypertension   . Hemorrhoids   . History of MRSA infection   . Hyperlipidemia   . Hypertension   . Hypothyroidism   . IBS (irritable bowel syndrome)   . Osteoarthritis of multiple joints   . Restless leg   . Senile osteoporosis   . Shingles   . Sleep apnea   . Ulcerative colitis ([place])   . Urge incontinence    Family History  Problem Relation Age of Onset  . Breast cancer Daughter   . Colon cancer Maternal Grandfather   . Hypertension Father   . Dementia Father   . Arthritis Mother   . Osteoporosis Mother   . Kidney disease Neg Hx   . Bladder Cancer Neg Hx    Past Surgical History:  Procedure Laterality Date  . ABDOMINAL HYSTERECTOMY  1980  . cataract surgery    . CHOLECYSTECTOMY  1955  . colon polyp removal  12/2012  . ESOPHAGOGASTRODUODENOSCOPY  2014  . makoplasty Left 06/2012  . parotid gland removal  1984   Patient Active Problem List   Diagnosis Date Noted  . Acute encephalopathy   . CVA (cerebral vascular accident) ([place]) 04/03/2017  . Syncope 03/03/2016  . Pain in shoulder 11/25/2014  . Impingement syndrome of  shoulder 11/25/2014  . Bilirubin in urine 10/04/2014  . Recurrent UTI 09/28/2014  . Urge incontinence 09/28/2014  . Microscopic hematuria 09/28/2014  . Cervical pain 09/28/2014  . Degenerative joint disease involving multiple joints 09/28/2014  . Involutional osteoporosis 09/28/2014  . Adult hypothyroidism 09/28/2014  . Acute low back pain 08/26/2014  . Arthralgia of hip 08/26/2014  . Combined fat and carbohydrate induced hyperlipemia 01/23/2014  . BP (high blood pressure) 01/23/2014  . Apnea, sleep 01/23/2014      Prior to Admission medications   Medication Sig Start Date End Date Taking? Authorizing Provider  aspirin EC 81 MG EC tablet Take 1 tablet (81 mg total) by mouth daily. 04/07/17   Henreitta Leber, MD  atorvastatin (LIPITOR) 40 MG tablet Take 1 tablet (40 mg total) by mouth daily at 6 PM. 04/06/17   Sainani, Belia Heman, MD  Calcium Carbonate-Vitamin D (CALCIUM 500 + D) 500-125 MG-UNIT TABS Take 1 tablet by mouth daily.     [provider]  clopidogrel (PLAVIX) 75 MG tablet Take 1 tablet (75 mg total) by mouth daily. 04/07/17   Henreitta Leber, MD  cyanocobalamin 1000 MCG tablet Take 1,000 mcg by mouth daily.    [provider]  DULoxetine (CYMBALTA) 30 MG capsule Take 30 mg by mouth daily.    [provider]  etodolac (LODINE) 400 MG tablet Take 400 mg by mouth 2 (two) times daily. 09/07/14   [provider]  levothyroxine (SYNTHROID, LEVOTHROID) 50 MCG tablet Take 50 mcg by mouth daily before breakfast.    [provider]  losartan (COZAAR) 50 MG tablet Take 50 mg by mouth daily.     [provider]  metoCLOPramide (REGLAN) 10 MG tablet Take 1 tablet (10 mg total) by mouth every 8 (eight) hours as needed. 12/10/16   Loney Hering, MD  metoprolol tartrate (LOPRESSOR) 25 MG tablet Take 0.5 tablets (12.5 mg total) by mouth 2 (two) times daily. 04/06/17   Henreitta Leber, MD  Multiple Vitamins-Minerals (MULTIVITAMIN ADULT  PO) Take by mouth daily.    [provider]  ondansetron (ZOFRAN ODT) 4 MG disintegrating tablet Take 1 tablet (4 mg total) by mouth every 8 (eight) hours as needed for nausea or vomiting. 11/11/16   Harvest Dark, MD  rOPINIRole (REQUIP) 0.5 MG tablet Take 1.5 mg by mouth at bedtime.     [provider]  spironolactone (ALDACTONE) 25 MG tablet Take 25 mg by mouth daily.    [provider]  VESICARE 5 MG tablet TAKE 1 TABLET BY MOUTH ONCE A DAY 02/15/17   McGowan, Larene Beach A, PA-C  vitamin E 400 UNIT capsule Take 400 Units by mouth daily.    [provider]    Allergies Codeine sulfate and Codeine    Social History Social History   Tobacco Use  . Smoking status: Former Research scientist (life sciences)  . Smokeless tobacco: Former Systems developer  . Tobacco comment: quit at age 46  Substance Use Topics  . Alcohol use: No    Alcohol/week: 0.0 oz    Comment: occasional  . Drug use: No    Review of Systems Patient denies headaches, rhinorrhea, blurry vision, numbness, shortness of breath, chest pain, edema, cough, abdominal pain, nausea, vomiting, diarrhea, dysuria, fevers, rashes or hallucinations unless otherwise stated above in HPI. ____________________________________________   PHYSICAL EXAM:  VITAL SIGNS: Vitals:   09/13/17 0848 09/13/17 0855  BP:  (!) 190/122  Pulse:  86  Resp:  17  Temp:  97.9 F (36.6 C)  SpO2: 98% 96%    Constitutional: Alert, pale appearing but in no acute distress. Eyes: Conjunctivae are normal.  Head: Atraumatic. Nose: No congestion/rhinnorhea. Mouth/Throat: Mucous membranes are moist.   Neck: No stridor. Painless ROM.  Cardiovascular: Normal rate, regular rhythm. Grossly normal heart sounds.  Good peripheral circulation. Respiratory: Normal respiratory effort.  No retractions. Lungs CTAB. Gastrointestinal: Soft and nontender. No distention. No abdominal bruits. No CVA tenderness. Genitourinary: frank maroon colored blood and stool, +  hemorrhoids without evidence of source of bleed Musculoskeletal: No lower extremity tenderness nor edema.  No joint effusions. Neurologic:  Normal speech and language. No gross focal neurologic deficits are appreciated. No facial droop Skin:  Skin is warm, dry and intact. No rash noted. Psychiatric: Speech and behavior are normal.  ____________________________________________   LABS (all labs ordered are listed, but only abnormal results are displayed)  Results for orders placed or performed during the hospital encounter of 09/13/17 (from the past 24 hour(s))  CBC with Differential/Platelet     Status: Abnormal   Collection Time: 09/13/17  8:55 AM  Result Value Ref Range   WBC 4.9 3.6 - 11.0 K/uL   RBC 4.39 3.80 - 5.20 MIL/uL   Hemoglobin 12.4 12.0 - 16.0 g/dL   HCT 37.7 35.0 - 47.0 %   MCV 85.7 80.0 -  100.0 fL   MCH 28.2 26.0 - 34.0 pg   MCHC 32.9 32.0 - 36.0 g/dL   RDW 16.0 (H) 11.5 - 14.5 %   Platelets 153 150 - 440 K/uL   Neutrophils Relative % 55 %   Neutro Abs 2.7 1.4 - 6.5 K/uL   Lymphocytes Relative 33 %   Lymphs Abs 1.6 1.0 - 3.6 K/uL   Monocytes Relative 9 %   Monocytes Absolute 0.4 0.2 - 0.9 K/uL   Eosinophils Relative 2 %   Eosinophils Absolute 0.1 0 - 0.7 K/uL   Basophils Relative 1 %   Basophils Absolute 0.0 0 - 0.1 K/uL  Comprehensive metabolic panel     Status: Abnormal   Collection Time: 09/13/17  8:55 AM  Result Value Ref Range   Sodium 138 135 - 145 mmol/L   Potassium 3.8 3.5 - 5.1 mmol/L   Chloride 106 101 - 111 mmol/L   CO2 26 22 - 32 mmol/L   Glucose, Bld 97 65 - 99 mg/dL   BUN 15 6 - 20 mg/dL   Creatinine, Ser 1.04 (H) 0.44 - 1.00 mg/dL   Calcium 9.0 8.9 - 10.3 mg/dL   Total Protein 7.0 6.5 - 8.1 g/dL   Albumin 4.0 3.5 - 5.0 g/dL   AST 24 15 - 41 U/L   ALT 18 14 - 54 U/L   Alkaline Phosphatase 47 38 - 126 U/L   Total Bilirubin 0.7 0.3 - 1.2 mg/dL   GFR calc non Af Amer 47 (L) >60 mL/min   GFR calc Af Amer 54 (L) >60 mL/min   Anion gap 6 5 -  15  Protime-INR     Status: None   Collection Time: 09/13/17  8:55 AM  Result Value Ref Range   Prothrombin Time 12.9 11.4 - 15.2 seconds   INR 0.98   APTT     Status: Abnormal   Collection Time: 09/13/17  8:55 AM  Result Value Ref Range   aPTT 37 (H) 24 - 36 seconds  Type and screen [place]     Status: None (Preliminary result)   Collection Time: 09/13/17  8:56 AM  Result Value Ref Range   ABO/RH(D) PENDING    Antibody Screen PENDING    Sample Expiration      09/16/2017 Performed at [facility], [address], [place] [address]   Prepare RBC     Status: None (Preliminary result)   Collection Time: 09/13/17  9:22 AM  Result Value Ref Range   Order Confirmation PENDING    ____________________________________________  EKG My review and personal interpretation at Time: 8:55   Indication: hematochezia  Rate: 85  Rhythm: sinus Axis: normal  Other: non specific st abn, no stemi ____________________________________________  RADIOLOGY   ____________________________________________   PROCEDURES  Procedure(s) performed:  .Critical Care Performed by: Merlyn Lot, MD Authorized by: Merlyn Lot, MD   Critical care provider statement:    Critical care time (minutes):  15   Critical care time was exclusive of:  Separately billable procedures and treating other patients   Critical care was necessary to treat or prevent imminent or life-threatening deterioration of the following conditions: active gi bleed requiring transfusion.   Critical care was time spent personally by me on the following activities:  Development of treatment plan with patient or surrogate, discussions with consultants, evaluation of patient's response to treatment, examination of patient, obtaining history from patient or surrogate, ordering and performing treatments and interventions, ordering  and review of laboratory studies, ordering and review of  radiographic studies, pulse oximetry, re-evaluation of patient's condition and review of old [place] performed: yes  ____________________________________________   INITIAL IMPRESSION / ASSESSMENT AND PLAN / ED COURSE  Pertinent labs & imaging results that were available during my care of the patient were reviewed by me and considered in my medical decision making (see chart for details).  DDX: ugib, lgib, mass, fissure, hemorrhoid  Kaitlyn Ramos is a 84 y.o. who presents to the ED with signs of active GI bleeding.  Patient does have a history of ulcerative colitis, gastritis as well as diverticulosis.  Seems more likely secondary to lower GI bleed.  Hemoglobin fortunately is 12 the patient is not hypotensive but given the volume of blood loss do feel patient would benefit from transfusion given her comorbidities, age and DNR status and effort to lessen any clinical decompensation.  Patient denies any pain at this time therefore would have a higher suspicion for diverticulosis as opposed to ulcerative colitis.  Patient given IV protonix and will require admission to hospital for further evaluation and management.  Spoke with Dr. Alice Reichert of gastroenterology who has recommended obtaining tagged red cell scan to localize any active bleeding.  Have discussed with the patient and available family all diagnostics and treatments performed thus far and all questions were answered to the best of my ability. The patient demonstrates understanding and agreement with plan.       As part of my medical decision making, I reviewed the following data within the [facility] notes reviewed and incorporated, Labs reviewed, notes from prior ED visits.   ____________________________________________   FINAL CLINICAL IMPRESSION(S) / ED DIAGNOSES  Final diagnoses:  Acute GI bleeding      NEW MEDICATIONS STARTED DURING THIS VISIT:  New Prescriptions   No  medications on file     Note:  This document was prepared using Dragon voice recognition software and may include unintentional dictation errors.    Merlyn Lot, MD 09/13/17 [phone]

## 2017-09-13 NOTE — Progress Notes (Signed)
Talked to Dr. Leslye Peer about patient's BP of 188/95, patient's home medication has not started, asked if we need to restart. MD place order for metoprolol 12.5 mg BID. No other concern at the moment. RN will continue to monitor.

## 2017-09-13 NOTE — Clinical Social Work Note (Signed)
CSW received consult that patient is from [name].  CSW contacted [facility], and they said patient is a long term care resident at [facility].  CSW to continue to follow patient's progress throughout discharge planning.  Jones Broom. Boyd, MSW, [place]  09/13/2017 2:35 PM

## 2017-09-13 NOTE — ED Notes (Signed)
Pt taken to nuclear medicine for NM GI scan.

## 2017-09-14 DIAGNOSIS — R402413 Glasgow coma scale score 13-15, at hospital admission: Secondary | ICD-10-CM | POA: Diagnosis present

## 2017-09-14 DIAGNOSIS — Z87891 Personal history of nicotine dependence: Secondary | ICD-10-CM | POA: Diagnosis not present

## 2017-09-14 DIAGNOSIS — D62 Acute posthemorrhagic anemia: Secondary | ICD-10-CM | POA: Diagnosis present

## 2017-09-14 DIAGNOSIS — K5731 Diverticulosis of large intestine without perforation or abscess with bleeding: Secondary | ICD-10-CM | POA: Diagnosis present

## 2017-09-14 DIAGNOSIS — E785 Hyperlipidemia, unspecified: Secondary | ICD-10-CM | POA: Diagnosis present

## 2017-09-14 DIAGNOSIS — G4730 Sleep apnea, unspecified: Secondary | ICD-10-CM | POA: Diagnosis present

## 2017-09-14 DIAGNOSIS — Z96652 Presence of left artificial knee joint: Secondary | ICD-10-CM | POA: Diagnosis present

## 2017-09-14 DIAGNOSIS — G309 Alzheimer's disease, unspecified: Secondary | ICD-10-CM | POA: Diagnosis present

## 2017-09-14 DIAGNOSIS — I482 Chronic atrial fibrillation: Secondary | ICD-10-CM | POA: Diagnosis present

## 2017-09-14 DIAGNOSIS — Z66 Do not resuscitate: Secondary | ICD-10-CM | POA: Diagnosis present

## 2017-09-14 DIAGNOSIS — F0280 Dementia in other diseases classified elsewhere without behavioral disturbance: Secondary | ICD-10-CM | POA: Diagnosis present

## 2017-09-14 DIAGNOSIS — Z885 Allergy status to narcotic agent status: Secondary | ICD-10-CM | POA: Diagnosis not present

## 2017-09-14 DIAGNOSIS — I10 Essential (primary) hypertension: Secondary | ICD-10-CM | POA: Diagnosis present

## 2017-09-14 DIAGNOSIS — I480 Paroxysmal atrial fibrillation: Secondary | ICD-10-CM | POA: Diagnosis present

## 2017-09-14 DIAGNOSIS — F329 Major depressive disorder, single episode, unspecified: Secondary | ICD-10-CM | POA: Diagnosis present

## 2017-09-14 DIAGNOSIS — Z8619 Personal history of other infectious and parasitic diseases: Secondary | ICD-10-CM | POA: Diagnosis not present

## 2017-09-14 DIAGNOSIS — M810 Age-related osteoporosis without current pathological fracture: Secondary | ICD-10-CM | POA: Diagnosis present

## 2017-09-14 DIAGNOSIS — N3941 Urge incontinence: Secondary | ICD-10-CM | POA: Diagnosis present

## 2017-09-14 DIAGNOSIS — G2581 Restless legs syndrome: Secondary | ICD-10-CM | POA: Diagnosis present

## 2017-09-14 DIAGNOSIS — E039 Hypothyroidism, unspecified: Secondary | ICD-10-CM | POA: Diagnosis present

## 2017-09-14 DIAGNOSIS — K219 Gastro-esophageal reflux disease without esophagitis: Secondary | ICD-10-CM | POA: Diagnosis present

## 2017-09-14 DIAGNOSIS — M542 Cervicalgia: Secondary | ICD-10-CM | POA: Diagnosis present

## 2017-09-14 DIAGNOSIS — K922 Gastrointestinal hemorrhage, unspecified: Secondary | ICD-10-CM | POA: Diagnosis not present

## 2017-09-14 DIAGNOSIS — M159 Polyosteoarthritis, unspecified: Secondary | ICD-10-CM | POA: Diagnosis present

## 2017-09-14 DIAGNOSIS — I700 Atherosclerosis of aorta: Secondary | ICD-10-CM | POA: Diagnosis present

## 2017-09-14 LAB — BASIC METABOLIC PANEL
ANION GAP: 9 (ref 5–15)
BUN: 12 mg/dL (ref 6–20)
CALCIUM: 8.4 mg/dL — AB (ref 8.9–10.3)
CO2: 21 mmol/L — AB (ref 22–32)
Chloride: 105 mmol/L (ref 101–111)
Creatinine, Ser: 0.77 mg/dL (ref 0.44–1.00)
GFR calc Af Amer: >60 mL/min (ref >60)
GFR calc non Af Amer: >60 mL/min (ref >60)
Glucose, Bld: 95 mg/dL (ref 65–99)
POTASSIUM: 3.6 mmol/L (ref 3.5–5.1)
Sodium: 135 mmol/L (ref 135–145)

## 2017-09-14 LAB — CBC
HCT: 35.9 % (ref 35.0–47.0)
HCT: 35 % (ref 35.0–47.0)
HEMATOCRIT: 35 % (ref 35.0–47.0)
HEMOGLOBIN: 12 g/dL (ref 12.0–16.0)
HEMOGLOBIN: 11.8 g/dL — AB (ref 12.0–16.0)
Hemoglobin: 11.7 g/dL — ABNORMAL LOW (ref 12.0–16.0)
MCH: 28.5 pg (ref 26.0–34.0)
MCH: 28.5 pg (ref 26.0–34.0)
MCH: 28.6 pg (ref 26.0–34.0)
MCHC: 33.5 g/dL (ref 32.0–36.0)
MCHC: 33.4 g/dL (ref 32.0–36.0)
MCHC: 33.6 g/dL (ref 32.0–36.0)
MCV: 85.3 fL (ref 80.0–100.0)
MCV: 85.3 fL (ref 80.0–100.0)
MCV: 85.2 fL (ref 80.0–100.0)
Platelets: 135 10*3/uL — ABNORMAL LOW (ref 150–440)
Platelets: 131 10*3/uL — ABNORMAL LOW (ref 150–440)
Platelets: 127 10*3/uL — ABNORMAL LOW (ref 150–440)
RBC: 4.21 MIL/uL (ref 3.80–5.20)
RBC: 4.1 MIL/uL (ref 3.80–5.20)
RBC: 4.11 MIL/uL (ref 3.80–5.20)
RDW: 15.7 % — ABNORMAL HIGH (ref 11.5–14.5)
RDW: 15.6 % — ABNORMAL HIGH (ref 11.5–14.5)
RDW: 15.7 % — ABNORMAL HIGH (ref 11.5–14.5)
WBC: 6.9 10*3/uL (ref 3.6–11.0)
WBC: 6.3 10*3/uL (ref 3.6–11.0)
WBC: 7.3 10*3/uL (ref 3.6–11.0)

## 2017-09-14 LAB — GLUCOSE, CAPILLARY: Glucose-Capillary: 90 mg/dL (ref 65–99)

## 2017-09-14 MED ORDER — LEVOTHYROXINE SODIUM 50 MCG PO TABS
50.0000 ug | ORAL_TABLET | Freq: Every day | ORAL | Status: DC
  Administered 2017-09-15 – 2017-09-16 (×2): 50 ug via ORAL
  Filled 2017-09-14 (×2): qty 1

## 2017-09-14 MED ORDER — LOSARTAN POTASSIUM 50 MG PO TABS
50.0000 mg | ORAL_TABLET | Freq: Every day | ORAL | Status: DC
  Administered 2017-09-14 – 2017-09-15 (×2): 50 mg via ORAL
  Filled 2017-09-14 (×2): qty 1

## 2017-09-14 MED ORDER — HYDRALAZINE HCL 20 MG/ML IJ SOLN: 10.0000 mg | Freq: Four times a day (QID) | INTRAMUSCULAR | Status: DC | PRN

## 2017-09-14 MED ORDER — PANTOPRAZOLE SODIUM 40 MG PO TBEC
40.0000 mg | DELAYED_RELEASE_TABLET | Freq: Two times a day (BID) | ORAL | Status: DC
  Administered 2017-09-14 – 2017-09-15 (×3): 40 mg via ORAL
  Filled 2017-09-14 (×4): qty 1

## 2017-09-14 MED ORDER — DULOXETINE HCL 30 MG PO CPEP
30.0000 mg | ORAL_CAPSULE | Freq: Every day | ORAL | Status: DC
  Administered 2017-09-14 – 2017-09-15 (×2): 30 mg via ORAL
  Filled 2017-09-14 (×3): qty 1

## 2017-09-14 NOTE — Clinical Social Work Note (Signed)
Clinical Social Work Assessment  Patient Details  Name: Kaitlyn Ramos MRN: [id number] Date of Birth: 05/18/1927  Date of referral:  09/14/17               Reason for consult:  Facility Placement                Permission sought to share information with:  Family Supports, Customer service manager Permission granted to share information::  Yes, Verbal Permission Granted  Name::     Tyson Dense Daughter [phone] or Donyel, Castagnola [phone] or [facility] Relative   [phone]   Agency::  SNF admissions  Relationship::     Contact Information:     Housing/Transportation Living arrangements for the past 2 months:  [place] of Information:  Adult Children Patient Interpreter Needed:  None Criminal Activity/Legal Involvement Pertinent to Current Situation/Hospitalization:  No - Comment as needed Significant Relationships:  Adult Children Lives with:  Facility Resident Do you feel safe going back to the place where you live?  Yes Need for family participation in patient care:  Yes (Comment)  Care giving concerns:  Patient's daughter did not express any concerns about returning to [facility].   Social Worker assessment / plan:  Patient is an 84 year old female who is alert and oriented x1.  Patient is a long term care resident at [facility].  CSW spoke to patient's daughter Joelene Millin to complete assessment due to patient's dementia.  Per patient's daughter patient has been at [facility] for 12 years, only been in the health care for about 5 months.  Patient's daughter states, they are happy with the care that is being provided and would like her to return back to SNF.  CSW explained role of CSW and process for coordinating discharge back to SNF.  CSW discussed with patient's daughter what clinical notes are sent to SNF to help facilitate discharging.  Patient's daughter did not express any other questions or concerns about  returning back to SNF.   Employment status:  Retired Forensic scientist:  Medicare PT Recommendations:  Not assessed at this time [place] / Referral to community resources:  [place]  Patient/Family's Response to care:  Patient's family is in agreement to returning back to SNF as a long term care resident.  Patient/Family's Understanding of and Emotional Response to Diagnosis, Current Treatment, and Prognosis:  Patient's family is hopeful that patient can return back to SNF soon.  Emotional Assessment Appearance:  Appears stated age Attitude/Demeanor/Rapport:   Affect (typically observed):  Appropriate, Stable Orientation:  Oriented to Self Alcohol / Substance use:  Not Applicable Psych involvement (Current and /or in the community):  No (Comment)  Discharge Needs  Concerns to be addressed:    Readmission within the last 30 days:  No Current discharge risk:  Cognitively Impaired Barriers to Discharge:  Continued Medical Work up   Anell Barr 09/14/2017, 4:31 PM

## 2017-09-14 NOTE — Progress Notes (Signed)
[facility]  Daily Progress Note   Subjective  - 1 Day Post-Op  Patient is demented, asking if she is going home  Denies pain  Objective Vitals:   09/14/17 0337 09/14/17 0750 09/14/17 1017 09/14/17 1554  BP: 132/66 (!) 149/64 (!) 155/75 (!) 147/73  Pulse: (!) 58 (!) 57 60 (!) 57  Resp: 18 19  18   Temp: 98.7 F (37.1 C) 98.4 F (36.9 C)  98.7 F (37.1 C)  TempSrc: Oral Oral  Oral  SpO2: 94% 95%  95%  Weight: 138 lb 4.8 oz (62.7 kg)     Height:        Intake/Output Summary (Last 24 hours) at 09/14/2017 1833 Last data filed at 09/14/2017 1500 Gross per 24 hour  Intake 1897.5 ml  Output 0 ml  Net 1897.5 ml    PULM  Normal effort , no use of accessory muscles CV  No JVD, RRR Abd      No distended, nontender VASC  Palpable radial pulses  Laboratory CBC    Component Value Date/Time   WBC 6.3 09/14/2017 0656   HGB 11.7 (L) 09/14/2017 0656   HGB 13.0 03/05/2013 1507   HCT 35.0 09/14/2017 0656   HCT 38.5 03/05/2013 1507   PLT 131 (L) 09/14/2017 0656   PLT 162 03/05/2013 1507    BMET    Component Value Date/Time   NA 135 09/14/2017 0106   NA 137 03/05/2013 1507   K 3.6 09/14/2017 0106   K 3.9 03/05/2013 1507   CL 105 09/14/2017 0106   CL 106 03/05/2013 1507   CO2 21 (L) 09/14/2017 0106   CO2 26 03/05/2013 1507   GLUCOSE 95 09/14/2017 0106   GLUCOSE 79 03/05/2013 1507   BUN 12 09/14/2017 0106   BUN 16 03/05/2013 1507   CREATININE 0.77 09/14/2017 0106   CREATININE 0.85 03/05/2013 1507   CALCIUM 8.4 (L) 09/14/2017 0106   CALCIUM 8.5 03/05/2013 1507   GFRNONAA >60 09/14/2017 0106   GFRNONAA >60 03/05/2013 1507   GFRAA >60 09/14/2017 0106   GFRAA >60 03/05/2013 1507    Assessment/Planning: POD #1 s/p embolization   Today Hgb is stable  S/p successful intervention no further procedures  Kaitlyn Ramos  09/14/2017, 6:33 PM

## 2017-09-14 NOTE — NC FL2 (Signed)
[place] LEVEL OF CARE SCREENING TOOL     IDENTIFICATION  Patient Name: Kaitlyn Ramos Birthdate: [birth year] Sex: female Admission Date (Current Location): 09/13/2017  [place] and [place] Number:  Engineering geologist and Address:  [facility], [address] [place], [place] [address]      Provider Number: [id number]  Attending Physician Name and Address:  Epifanio Lesches, MD  Relative Name and Phone Number:  Tyson Dense Daughter [phone]  or Shakti, Fleer [phone]  [phone] or McPherson,Gary Relative   [phone]     Current Level of Care: Hospital Recommended Level of Care: [place] Prior Approval Number:    Date Approved/Denied:   PASRR Number: [id number]  Discharge Plan: SNF    Current Diagnoses: Patient Active Problem List   Diagnosis Date Noted  . Acute lower GI bleeding 09/13/2017  . Acute encephalopathy   . CVA (cerebral vascular accident) ([place]) 04/03/2017  . Syncope 03/03/2016  . Pain in shoulder 11/25/2014  . Impingement syndrome of shoulder 11/25/2014  . Bilirubin in urine 10/04/2014  . Recurrent UTI 09/28/2014  . Urge incontinence 09/28/2014  . Microscopic hematuria 09/28/2014  . Cervical pain 09/28/2014  . Degenerative joint disease involving multiple joints 09/28/2014  . Involutional osteoporosis 09/28/2014  . Adult hypothyroidism 09/28/2014  . Acute low back pain 08/26/2014  . Arthralgia of hip 08/26/2014  . Combined fat and carbohydrate induced hyperlipemia 01/23/2014  . BP (high blood pressure) 01/23/2014  . Apnea, sleep 01/23/2014    Orientation RESPIRATION BLADDER Height & Weight     Self  Normal Incontinent Weight: 138 lb 4.8 oz (62.7 kg) Height:  5\' 3"  (160 cm)  BEHAVIORAL SYMPTOMS/MOOD NEUROLOGICAL BOWEL NUTRITION STATUS      Incontinent Diet(Clear Liquid)  AMBULATORY STATUS COMMUNICATION OF NEEDS Skin   Extensive Assist Verbally Normal                        Personal Care Assistance Level of Assistance  Bathing, Feeding, Dressing Bathing Assistance: Maximum assistance Feeding assistance: Limited assistance Dressing Assistance: Maximum assistance     Functional Limitations Info  Sight, Hearing, Speech Sight Info: Adequate Hearing Info: Adequate Speech Info: Adequate    SPECIAL CARE FACTORS FREQUENCY                       Contractures Contractures Info: Not present    Additional Factors Info  Code Status, Allergies Code Status Info: DNR Allergies Info: CODEINE SULFATE, CODEINE            Current Medications (09/14/2017):  This is the current hospital active medication list Current Facility-Administered Medications  Medication Dose Route Frequency Provider Last Rate Last Dose  . 0.9 %  sodium chloride infusion   Intravenous Continuous Schnier, Dolores Lory, MD 75 mL/hr at 09/14/17 0457    . acetaminophen (TYLENOL) tablet 650 mg  650 mg Oral Q6H PRN Schnier, Dolores Lory, MD       Or  . acetaminophen (TYLENOL) suppository 650 mg  650 mg Rectal Q6H PRN Schnier, Dolores Lory, MD      . metoprolol tartrate (LOPRESSOR) tablet 12.5 mg  12.5 mg Oral BID Loletha Grayer, MD   12.5 mg at 09/13/17 2107  . ondansetron (ZOFRAN) tablet 4 mg  4 mg Oral Q6H PRN Schnier, Dolores Lory, MD       Or  . ondansetron [facility]) injection 4 mg  4 mg Intravenous Q6H PRN Schnier, Dolores Lory,  MD      . Derrill Memo ON 09/16/2017] pantoprazole (PROTONIX) injection 40 mg  40 mg Intravenous Q12H Schnier, Dolores Lory, MD      . traZODone (DESYREL) tablet 25 mg  25 mg Oral QHS PRN Schnier, Dolores Lory, MD         Discharge Medications: Please see discharge summary for a list of discharge medications.  Relevant Imaging Results:  Relevant Lab Results:   Additional Information SSN [id number]  Ross Ludwig, [place]

## 2017-09-14 NOTE — Progress Notes (Addendum)
  Subjective: Since I last evaluated the patient , she has undergone visceral angiography with embolization of the IMA. Patient is stable without significant rebleeding.  Objective: Vital signs in last 24 hours: Temp:  [98.4 F (36.9 C)-99 F (37.2 C)] 98.7 F (37.1 C) (05/30 1554) Pulse Rate:  [57-78] 57 (05/30 1554) Resp:  [14-19] 18 (05/30 1554) BP: (132-188)/(64-95) 147/73 (05/30 1554) SpO2:  [92 %-97 %] 95 % (05/30 1554) Weight:  [62.7 kg (138 lb 4.8 oz)] 62.7 kg (138 lb 4.8 oz) (05/30 0337) Last BM Date: 09/14/17  Intake/Output from previous day: 05/29 0701 - 05/30 0700 In: 1338.8 [I.V.:918.8; Blood:320; IV Piggyback:100] Out: 0  Intake/Output this shift: Total I/O In: 8502 [P.O.:360; I.V.:825] Out: -   HEENT: [place]/AT. PERRLA Neck: Supple, trachea midline. Chest: CTA Abd: Soft, nt, nd. BS+ Ext: No edema. Neuro: Alert, confused, NAD.  Lab Results: Recent Labs    09/13/17 1856 09/14/17 0106 09/14/17 0656  WBC 5.6 6.9 6.3  HGB 12.3 12.0 11.7*  HCT 36.7 35.9 35.0  PLT 134* 135* 131*   BMET Recent Labs    09/13/17 0855 09/14/17 0106  NA 138 135  K 3.8 3.6  CL 106 105  CO2 26 21*  GLUCOSE 97 95  BUN 15 12  CREATININE 1.04* 0.77  CALCIUM 9.0 8.4*   LFT Recent Labs    09/13/17 0855  PROT 7.0  ALBUMIN 4.0  AST 24  ALT 18  ALKPHOS 47  BILITOT 0.7   PT/INR Recent Labs    09/13/17 0855  LABPROT 12.9  INR 0.98   Hepatitis Panel No results for input(s): HEPBSAG, HCVAB, HEPAIGM, HEPBIGM in the last 72 hours. C-Diff No results for input(s): CDIFFTOX in the last 72 hours. No results for input(s): CDIFFPCR in the last 72 hours. Fecal Lactopherrin No results for input(s): FECLLACTOFRN in the last 72 hours.  Studies/Results: Nm Gi Blood Loss  Result Date: 09/13/2017 CLINICAL DATA:  Gastrointestinal bleeding EXAM: NUCLEAR MEDICINE GASTROINTESTINAL BLEEDING SCAN TECHNIQUE: Sequential frontal abdominal and pelvic images were obtained following  intravenous administration of Tc-64m labeled red blood cells. RADIOPHARMACEUTICALS:  20.572 mCi Tc-16m pertechnetate in-vitro labeled red cells. COMPARISON:  None. FINDINGS: There is an area of ectopic radiotracer uptake just to the left of midline in the upper pelvic region. This area became more prominent over time. After defecating, this area of radiotracer no longer became apparent. Additional imaging was performed with return of radiotracer uptake in the upper pelvis slightly to the left in the midline. This appearance is felt to represent ectopic bleeding site in the sigmoid colon region. IMPRESSION: Abnormal radiotracer uptake in the upper pelvis as described, felt to represent an intermittently active bleeding site in the sigmoid colon region. Electronically Signed   By: [name] III M.D.   On: 09/13/2017 14:02    Medications: I have reviewed the patient's current medications.  Assessment/Plan: Continue current care. Will defer discharge recommendations to vascular surgery.  Call back if we can help.    LOS: 0 days   Olean Ree 09/14/2017, 4:44 PM

## 2017-09-14 NOTE — Plan of Care (Signed)
  Problem: Education: Goal: Knowledge of General Education information will improve Outcome: Progressing   Problem: Health Behavior/Discharge Planning: Goal: Ability to manage health-related needs will improve Outcome: Progressing   Problem: Clinical Measurements: Goal: Ability to maintain clinical measurements within normal limits will improve Outcome: Progressing Goal: Will remain free from infection Outcome: Progressing Goal: Diagnostic test results will improve Outcome: Progressing Goal: Respiratory complications will improve Outcome: Progressing Goal: Cardiovascular complication will be avoided Outcome: Progressing   Problem: Activity: Goal: Risk for activity intolerance will decrease Outcome: Progressing   Problem: Nutrition: Goal: Adequate nutrition will be maintained Outcome: Progressing   Problem: Coping: Goal: Level of anxiety will decrease Outcome: Progressing   Problem: Elimination: Goal: Will not experience complications related to bowel motility Outcome: Progressing Goal: Will not experience complications related to urinary retention Outcome: Progressing   Problem: Pain Managment: Goal: General experience of comfort will improve Outcome: Progressing   Problem: Safety: Goal: Ability to remain free from injury will improve Outcome: Progressing   Problem: Skin Integrity: Goal: Risk for impaired skin integrity will decrease Outcome: Progressing   Problem: Education: Goal: Ability to identify signs and symptoms of gastrointestinal bleeding will improve Outcome: Progressing   Problem: Bowel/Gastric: Goal: Will show no signs and symptoms of gastrointestinal bleeding Outcome: Progressing   Problem: Fluid Volume: Goal: Will show no signs and symptoms of excessive bleeding Outcome: Progressing   Problem: Clinical Measurements: Goal: Complications related to the disease process, condition or treatment will be avoided or minimized Outcome:  Progressing   

## 2017-09-14 NOTE — Progress Notes (Signed)
[place] [facility] [place] NAME: Kaitlyn Ramos    MR#:  [id number]  DATE OF BIRTH:  09-18-1927  SUBJECTIVE: 84 year old female with dementia admitted because of rectal bleeding and found to have active bleeding in sigmoid colon, status post embolization by vascular.  Patient is confused and has baseline dementia.  Patient had episode of bloody stool this morning.  CHIEF COMPLAINT:   Chief Complaint  Patient presents with  . GI Bleeding    REVIEW OF SYSTEMS:   Review of Systems  Unable to perform ROS: Dementia  She appears comfortable.  DRUG ALLERGIES:   Allergies  Allergen Reactions  . Codeine Sulfate Hives and Itching    Can take tramadol  . Codeine Other (See Comments)    VITALS:  Blood pressure (!) 149/64, pulse (!) 57, temperature 98.4 F (36.9 C), temperature source Oral, resp. rate 19, height 5\' 3"  (1.6 m), weight 62.7 kg (138 lb 4.8 oz), SpO2 95 %.  PHYSICAL EXAMINATION:  GENERAL:  84 y.o.-year-old patient lying in the bed with no acute distress.  EYES: Pupils equal, round, reactive to light and accommodation. No scleral icterus. Extraocular muscles intact.  HEENT: Head atraumatic, normocephalic. Oropharynx and nasopharynx clear.  NECK:  Supple, no jugular venous distention. No thyroid enlargement, no tenderness.  LUNGS: Normal breath sounds bilaterally, no wheezing, rales,rhonchi or crepitation. No use of accessory muscles of respiration.  CARDIOVASCULAR: S1, S2 normal. No murmurs, rubs, or gallops.  ABDOMEN: Soft, nontender, nondistended. Bowel sounds present. No organomegaly or mass.  EXTREMITIES: No pedal edema, cyanosis, or clubbing.  NEUROLOGIC:  noGross neurological deficit.  PSYCHIATRIC: Pleasantly confused. SKIN: No obvious rash, lesion, or ulcer.    LABORATORY PANEL:   CBC Recent Labs  Lab 09/14/17 0656  WBC 6.3  HGB 11.7*  HCT 35.0  PLT 131*    ------------------------------------------------------------------------------------------------------------------  Chemistries  Recent Labs  Lab 09/13/17 0855 09/14/17 0106  NA 138 135  K 3.8 3.6  CL 106 105  CO2 26 21*  GLUCOSE 97 95  BUN 15 12  CREATININE 1.04* 0.77  CALCIUM 9.0 8.4*  AST 24  --   ALT 18  --   ALKPHOS 47  --   BILITOT 0.7  --    ------------------------------------------------------------------------------------------------------------------  Cardiac Enzymes No results for input(s): TROPONINI in the last 168 hours. ------------------------------------------------------------------------------------------------------------------  RADIOLOGY:  Nm Gi Blood Loss  Result Date: 09/13/2017 CLINICAL DATA:  Gastrointestinal bleeding EXAM: NUCLEAR MEDICINE GASTROINTESTINAL BLEEDING SCAN TECHNIQUE: Sequential frontal abdominal and pelvic images were obtained following intravenous administration of Tc-40m labeled red blood cells. RADIOPHARMACEUTICALS:  20.572 mCi Tc-40m pertechnetate in-vitro labeled red cells. COMPARISON:  None. FINDINGS: There is an area of ectopic radiotracer uptake just to the left of midline in the upper pelvic region. This area became more prominent over time. After defecating, this area of radiotracer no longer became apparent. Additional imaging was performed with return of radiotracer uptake in the upper pelvis slightly to the left in the midline. This appearance is felt to represent ectopic bleeding site in the sigmoid colon region. IMPRESSION: Abnormal radiotracer uptake in the upper pelvis as described, felt to represent an intermittently active bleeding site in the sigmoid colon region. Electronically Signed   By: [name] III M.D.   On: 09/13/2017 14:02    EKG:   Orders placed or performed during the hospital encounter of 09/13/17  . ED EKG  . ED EKG    ASSESSMENT AND PLAN:   84 year old  female patient with rectal bleeding  and found to have active bleeding in sigmoid colon by tagged red blood scan, status post embolization of IMA by vascular.  Patient may be having residual rectal bleed but hemodynamically stable, hemoglobin also stable around 11.7 today morning. 2.  Acute blood loss anemia secondary to rectal bleed, status post 1 unit of transfusion, hemodynamically stable this morning, hemoglobin 11.7. 3.  History of Alzheimer's dementia: Patient is at her baseline, CODE STATUS DNR.  Discussed with patient's daughter. 4.  History of chronic atrial fibrillation, hold aspirin, Plavix secondary to GI bleed.  Continue metoprolol 12.5 mg p.o. twice daily. 5.  GERD: Continue PPIs. Plan to continue clear liquids, advance the diet to low-sodium diet if okay with gastroenterology.  Continue to monitor CBC every 6 hours today, likely discharge home tomorrow if stable.     All the records are reviewed and case discussed with Care Management/Social Workerr. Management plans discussed with the patient, family and they are in agreement.  CODE STATUS:DNR  TOTAL TIME TAKING CARE OF THIS PATIENT: 45 minutes.   POSSIBLE D/C IN 1-2DAYS, DEPENDING ON CLINICAL CONDITION.  More than 50% of the time spent in counseling, coordination of care Epifanio Lesches M.D on 09/14/2017 at 10:14 AM  Between 7am to 6pm - Pager - [phone]  After 6pm go to [email or web address] - password [email or web address] [facility]  Office  [phone]  CC: Primary care physician; Venia Carbon, MD   Note: This dictation was prepared with Dragon dictation along with smaller phrase technology. Any transcriptional errors that result from this process are unintentional.

## 2017-09-14 NOTE — Progress Notes (Signed)
Patient is post microbead embolization of IMA through the right groin. The insertion site is clean, dry and intact without hematoma. Patient continue to have bloody stool and still have Q6 CBC. H/H is stable at this time. Patient remained hemodynamically stable without any acute event overnight.

## 2017-09-14 NOTE — Progress Notes (Signed)
Talked to Dr. Posey Pronto, Gus Height about patient's BP of 155/100 order to restart Losartan which is her home medication and PRN IV Hydralazine also given. Patient also has 4 bloody stool total since this morning, order for STAT CBC, last hemoglobin was 11.7 this morning. Patient does complaints of dizziness, was nauseous which I gave IV Zofran. No other concern at the moment. RN will continue to monitor.

## 2017-09-14 NOTE — Progress Notes (Signed)
Spoke to Dr. Vianne Bulls. Will hold advancing the diet since patient continues with bloody stool this morning. Daughter Kaitlyn Ramos is at bedside and hoping for patient to be discharged today.

## 2017-09-15 ENCOUNTER — Encounter: Payer: Self-pay | Admitting: Vascular Surgery

## 2017-09-15 LAB — BPAM RBC
Blood Product Expiration Date: 201906202359
Blood Product Expiration Date: 201906202359
ISSUE DATE / TIME: 201905291448
UNIT TYPE AND RH: 5100
Unit Type and Rh: 5100

## 2017-09-15 LAB — TYPE AND SCREEN
ABO/RH(D): O POS
Antibody Screen: NEGATIVE
Unit division: 0
Unit division: 0

## 2017-09-15 LAB — PREPARE RBC (CROSSMATCH)

## 2017-09-15 LAB — CBC
HEMATOCRIT: 32.6 % — AB (ref 35.0–47.0)
HEMOGLOBIN: 11.4 g/dL — AB (ref 12.0–16.0)
MCH: 29.4 pg (ref 26.0–34.0)
MCHC: 35 g/dL (ref 32.0–36.0)
MCV: 84.2 fL (ref 80.0–100.0)
Platelets: 122 10*3/uL — ABNORMAL LOW (ref 150–440)
RBC: 3.88 MIL/uL (ref 3.80–5.20)
RDW: 15.8 % — ABNORMAL HIGH (ref 11.5–14.5)
WBC: 6.9 10*3/uL (ref 3.6–11.0)

## 2017-09-15 LAB — GLUCOSE, CAPILLARY: Glucose-Capillary: 91 mg/dL (ref 65–99)

## 2017-09-15 NOTE — Plan of Care (Signed)
  Problem: Clinical Measurements: Goal: Ability to maintain clinical measurements within normal limits will improve Outcome: Progressing Goal: Will remain free from infection Outcome: Progressing   Problem: Safety: Goal: Ability to remain free from injury will improve Outcome: Progressing   Problem: Fluid Volume: Goal: Will show no signs and symptoms of excessive bleeding Outcome: Progressing

## 2017-09-15 NOTE — Care Management Important Message (Signed)
Important Message  Patient Details  Name: Kaitlyn Ramos MRN: [id number] Date of Birth: Aug 19, 1927   Medicare Important Message Given:  Yes    Katrina Stack, RN 09/15/2017, 12:09 PM

## 2017-09-15 NOTE — Progress Notes (Signed)
[facility] NAME: Kaitlyn Ramos    MR#:  [id number]  DATE OF BIRTH:  22-Mar-1928  SUBJECTIVE: 84 year old female with dementia admitted because of rectal bleeding and found to have active bleeding in sigmoid colon, status post embolization by vascular.  Patient is confused and has baseline dementia.  Patient had episode of bloody stool this morning.  CHIEF COMPLAINT:   Chief Complaint  Patient presents with  . GI Bleeding   Patient hemoglobin stable, started on solid diet today.  Likely discharge tomorrow,  REVIEW OF SYSTEMS:   Review of Systems  Unable to perform ROS: Dementia  She appears comfortable.  DRUG ALLERGIES:   Allergies  Allergen Reactions  . Codeine Sulfate Hives and Itching    Can take tramadol  . Codeine Other (See Comments)    VITALS:  Blood pressure (!) 144/74, pulse (!) 57, temperature 98.3 F (36.8 C), temperature source Oral, resp. rate 18, height 5\' 3"  (1.6 m), weight 63.4 kg (139 lb 12.4 oz), SpO2 91 %.  PHYSICAL EXAMINATION:  GENERAL:  84 y.o.-year-old patient lying in the bed with no acute distress.  EYES: Pupils equal, round, reactive to light and accommodation. No scleral icterus. Extraocular muscles intact.  HEENT: Head atraumatic, normocephalic. Oropharynx and nasopharynx clear.  NECK:  Supple, no jugular venous distention. No thyroid enlargement, no tenderness.  LUNGS: Normal breath sounds bilaterally, no wheezing, rales,rhonchi or crepitation. No use of accessory muscles of respiration.  CARDIOVASCULAR: S1, S2 normal. No murmurs, rubs, or gallops.  ABDOMEN: Soft, nontender, nondistended. Bowel sounds present. No organomegaly or mass.  EXTREMITIES: No pedal edema, cyanosis, or clubbing.  NEUROLOGIC:  noGross neurological deficit.  PSYCHIATRIC: Pleasantly confused. SKIN: No obvious rash, lesion, or ulcer.    LABORATORY PANEL:   CBC Recent Labs  Lab 09/15/17 0519  WBC 6.9  HGB  11.4*  HCT 32.6*  PLT 122*   ------------------------------------------------------------------------------------------------------------------  Chemistries  Recent Labs  Lab 09/13/17 0855 09/14/17 0106  NA 138 135  K 3.8 3.6  CL 106 105  CO2 26 21*  GLUCOSE 97 95  BUN 15 12  CREATININE 1.04* 0.77  CALCIUM 9.0 8.4*  AST 24  --   ALT 18  --   ALKPHOS 47  --   BILITOT 0.7  --    ------------------------------------------------------------------------------------------------------------------  Cardiac Enzymes No results for input(s): TROPONINI in the last 168 hours. ------------------------------------------------------------------------------------------------------------------  RADIOLOGY:  Nm Gi Blood Loss  Result Date: 09/13/2017 CLINICAL DATA:  Gastrointestinal bleeding EXAM: NUCLEAR MEDICINE GASTROINTESTINAL BLEEDING SCAN TECHNIQUE: Sequential frontal abdominal and pelvic images were obtained following intravenous administration of Tc-63m labeled red blood cells. RADIOPHARMACEUTICALS:  20.572 mCi Tc-83m pertechnetate in-vitro labeled red cells. COMPARISON:  None. FINDINGS: There is an area of ectopic radiotracer uptake just to the left of midline in the upper pelvic region. This area became more prominent over time. After defecating, this area of radiotracer no longer became apparent. Additional imaging was performed with return of radiotracer uptake in the upper pelvis slightly to the left in the midline. This appearance is felt to represent ectopic bleeding site in the sigmoid colon region. IMPRESSION: Abnormal radiotracer uptake in the upper pelvis as described, felt to represent an intermittently active bleeding site in the sigmoid colon region. Electronically Signed   By: [name] III M.D.   On: 09/13/2017 14:02    EKG:   Orders placed or performed during the hospital encounter of 09/13/17  . ED EKG  .  ED EKG    ASSESSMENT AND PLAN:   84 year old female  patient with rectal bleeding and found to have active bleeding in sigmoid colon by tagged red blood scan, status post embolization of IMA by vascular.  Patient may be having residual rectal bleed but hemodynamically stable, hemoglobin also stable around 11.7 today morning. 2.  Acute blood loss anemia secondary to rectal bleed, status post embolization of IMA secondary to sigmoid colon bleeding.  Status post 1 unit of transfusion, hemodynamically stable this morning, hemoglobin 11.7.  Advance the diet to solid diet.  Monitor closely, if hemodynamically stable today without evidence of further rectal bleed discharge  to SNF tomorrow. 3.  History of Alzheimer's dementia: Patient is at her baseline, CODE STATUS DNR.  Discussed with patient's daughter. 4.  History of chronic atrial fibrillation, hold aspirin, Plavix secondary to GI bleed.  Continue metoprolol 12.5 mg p.o. twice daily. 5.  GERD: Continue PPIs. Plan to continue clear liquids, advance the diet to low-sodium diet if okay with gastroenterology.  Continue to monitor CBC every 6 hours today, likely discharge home tomorrow if stable.     All the records are reviewed and case discussed with Care Management/Social Workerr. Management plans discussed with the patient, family and they are in agreement.  CODE STATUS:DNR  TOTAL TIME TAKING CARE OF THIS PATIENT: 45 minutes.   POSSIBLE D/C IN 1-2DAYS, DEPENDING ON CLINICAL CONDITION.  More than 50% of the time spent in counseling, coordination of care Epifanio Lesches M.D on 09/15/2017 at 11:09 AM  Between 7am to 6pm - Pager - [phone]  After 6pm go to [email or web address] - password [email or web address]  Office  [phone]  CC: Primary care physician; Venia Carbon, MD   Note: This dictation was prepared with Dragon dictation along with smaller phrase technology. Any transcriptional errors that result from this process are unintentional.

## 2017-09-15 NOTE — Evaluation (Signed)
Physical Therapy Evaluation Patient Details Name: Kaitlyn Ramos MRN: [id number] DOB: December 24, 1927 Today's Date: 09/15/2017   History of Present Illness  Pt is an 84 y.o. female admitted to hospital 09/13/17 for acute lower GI bleeding. PMH includes acute nonhemorrhagic L thalamus infarct, IBS, compression fx T8, cervicalgia, shingles, and HTN.  Clinical Impression  Prior to hospital admission, pt was modified independent for bed mobility, modified independent for transfers, and minimum guard for ambulation. Pt able to walk a quarter of a mile on a good day. Pt lives in a long term care SNF with assistance needed for ADLs d/t Alzheimer's.  Currently pt is supervision for bed mobility, and began to feel dizzy once on EOB; symptoms faded w/ rest. She requires minimum assistance to initiate sit to stand transfers and requires vc's and tactile cues for stand to sit transfers. She ambulated 3 feet w/ minimum guard to her chair and was experiencing fatigue d/t her deconditioning. Pt would benefit from skilled PT to address noted impairments and functional limitations (see below for any additional details).  Upon hospital discharge, recommend pt discharge to prior facility w/ PT.    Follow Up Recommendations SNF    Equipment Recommendations  Rolling walker with 5" wheels    Recommendations for Other Services OT consult     Precautions / Restrictions Precautions Precautions: Fall Restrictions Weight Bearing Restrictions: No      Mobility  Bed Mobility Overal bed mobility: Needs Assistance Bed Mobility: Supine to Sit     Supine to sit: Supervision     General bed mobility comments: Pt requires supervision for safety.  Transfers Overall transfer level: Needs assistance Equipment used: Rolling walker (2 wheeled) Transfers: Sit to/from Stand Sit to Stand: Min assist         General transfer comment: Pt requires assistance initiating sit to stand; assistance with UE and LE placement  stand to sit, tactile cues and vc's given.  Ambulation/Gait Ambulation/Gait assistance: Min guard Ambulation Distance (Feet): 3 Feet Assistive device: Rolling walker (2 wheeled) Gait Pattern/deviations: Step-to pattern Gait velocity: decreased   General Gait Details: difficulty d/t pt weakness from deconditioning  Stairs            Wheelchair Mobility    Modified Rankin (Stroke Patients Only)       Balance Overall balance assessment: Needs assistance Sitting-balance support: Single extremity supported Sitting balance-Leahy Scale: Poor Sitting balance - Comments: Pt grabbed onto bed rail to maintain balance on EOB, felt dizzy and needed time for symptoms to fade   Standing balance support: Bilateral upper extremity supported Standing balance-Leahy Scale: Fair Standing balance comment: Pt requires minimum guarding standing in RW because of weakness d/t deconditioning                             Pertinent Vitals/Pain Pain Assessment: No/denies pain(Pt states she is not aware if she is feeling pain)    Home Living Family/patient expects to be discharged to:: Skilled nursing facility                 Additional Comments: Pt lives at [facility] long term care.    Prior Function Level of Independence: Needs assistance   Gait / Transfers Assistance Needed: Pt could get to standing on her own w/ 4-wheeled walker. Pt uses 4-wheeled walker in facility for ambulation w/ assistance for balance. Able to walk quarter mile on a good day.   ADL's / Homemaking Assistance Needed:  Pt has caretakers to help w/ self care; Pt can feed self.        Hand Dominance        Extremity/Trunk Assessment   Upper Extremity Assessment Upper Extremity Assessment: Generalized weakness    Lower Extremity Assessment Lower Extremity Assessment: Generalized weakness    Cervical / Trunk Assessment Cervical / Trunk Assessment: Normal  Communication   Communication:  Expressive difficulties;Receptive difficulties  Cognition Arousal/Alertness: Awake/alert Behavior During Therapy: WFL for tasks assessed/performed Overall Cognitive Status: Impaired/Different from baseline Area of Impairment: Following commands;Orientation                 Orientation Level: (Oriented to name/DOB/place; pt reported she was 84 years old)     Following Commands: Follows one step commands consistently       General Comments: H/O of Alzheimer's, difficulty with finding words occasionally      General Comments      Exercises     Assessment/Plan    PT Assessment Patient needs continued PT services  PT Problem List Decreased strength;Decreased mobility;Decreased activity tolerance;Decreased balance;Decreased cognition;Decreased safety awareness;Decreased knowledge of precautions       PT Treatment Interventions Gait training;Functional mobility training;Therapeutic activities;Therapeutic exercise;Balance training    PT Goals (Current goals can be found in the Care Plan section)  Acute Rehab PT Goals Patient Stated Goal: to improve mobility PT Goal Formulation: With patient/family Time For Goal Achievement: 09/29/17 Potential to Achieve Goals: Good    Frequency Min 2X/week   Barriers to discharge        Co-evaluation               AM-PAC PT "6 Clicks" Daily Activity  Outcome Measure Difficulty turning over in bed (including adjusting bedclothes, sheets and blankets)?: A Little Difficulty moving from lying on back to sitting on the side of the bed? : Unable Difficulty sitting down on and standing up from a chair with arms (e.g., wheelchair, bedside commode, etc,.)?: Unable Help needed moving to and from a bed to chair (including a wheelchair)?: A Lot Help needed walking in hospital room?: A Little Help needed climbing 3-5 steps with a railing? : A Lot 6 Click Score: 12    End of Session Equipment Utilized During Treatment: Gait  belt Activity Tolerance: Patient tolerated treatment well;Patient limited by fatigue Patient left: in chair;with call bell/phone within reach;with chair alarm set;with family/visitor present;with nursing/sitter in room Nurse Communication: Mobility status;Precautions;Other (comment)(Pt chair pad alarm was connected to green chair alarm, green chair alarm did not have grey cord attached connecting to nursing call light. Nursing notified) PT Visit Diagnosis: Other abnormalities of gait and mobility (R26.89);Muscle weakness (generalized) (M62.81);Difficulty in walking, not elsewhere classified (R26.2)    Time: 5427-0623 PT Time Calculation (min) (ACUTE ONLY): 34 min   Charges:         PT G CodesChana Bode, SPT 09/15/17, 1:10 PM

## 2017-09-15 NOTE — Care Management (Signed)
Patient presents from [facility] skilled [facility] where she has been under a long term care plan of care.  Initially placed in observation for gi bleed.  Admitted on 5/30.  Embolization by vascular 5/29.   Anticipate return to facility at discharge

## 2017-09-16 LAB — GLUCOSE, CAPILLARY: GLUCOSE-CAPILLARY: 85 mg/dL (ref 65–99)

## 2017-09-16 MED ORDER — PANTOPRAZOLE SODIUM 40 MG PO TBEC: 40.0000 mg | DELAYED_RELEASE_TABLET | Freq: Every day | ORAL | 0 refills | Status: DC

## 2017-09-16 NOTE — Plan of Care (Signed)
  Problem: Clinical Measurements: Goal: Will remain free from infection Outcome: Progressing   Problem: Safety: Goal: Ability to remain free from injury will improve Outcome: Progressing   Problem: Bowel/Gastric: Goal: Will show no signs and symptoms of gastrointestinal bleeding Outcome: Progressing   Problem: Fluid Volume: Goal: Will show no signs and symptoms of excessive bleeding Outcome: Progressing

## 2017-09-16 NOTE — Clinical Social Work Note (Addendum)
The CSW has contacted [facility] to alert of the patient's imminent discharge. The facility will contact the CSW when they have completed the readmission. CSW will update as more information becomes available.  UPDATE: The patient will discharge today to room 228 at [facility] via non-emergent EMS. The patient, her daughter, and the facility are aware and are in agreement. The CSW has delivered the discharge paperwork to the chart. The CSW is signing off. Please consult should needs arise.  Santiago Bumpers, MSW, Latanya Presser [phone]

## 2017-09-16 NOTE — Discharge Summary (Signed)
Kaitlyn Ramos, is a 84 y.o. female  DOB 11-11-1927  MRN [id number].  Admission date:  09/13/2017  Admitting Physician  Epifanio Lesches, MD  Discharge Date:  09/16/2017   Primary MD  Venia Carbon, MD  Recommendations for primary care physician for things to follow:   Follow-up with PCP in 1 week   Admission Diagnosis  Acute GI bleeding [K92.2]   Discharge Diagnosis  Acute GI bleeding [K92.2]   Active Problems:   Acute lower GI bleeding      Past Medical History:  Diagnosis Date  . Cervicalgia   . Depression   . Essential hypertension   . Hemorrhoids   . History of MRSA infection   . Hyperlipidemia   . Hypertension   . Hypothyroidism   . IBS (irritable bowel syndrome)   . Osteoarthritis of multiple joints   . Restless leg   . Senile osteoporosis   . Shingles   . Sleep apnea   . Ulcerative colitis ([place])   . Urge incontinence     Past Surgical History:  Procedure Laterality Date  . ABDOMINAL HYSTERECTOMY  1980  . cataract surgery    . CHOLECYSTECTOMY  1955  . colon polyp removal  12/2012  . ESOPHAGOGASTRODUODENOSCOPY  2014  . makoplasty Left 06/2012  . parotid gland removal  1984  . VISCERAL ANGIOGRAPHY N/A 09/13/2017   Procedure: VISCERAL ANGIOGRAPHY;  Surgeon: Katha Cabal, MD;  Location: [facility];  Service: Cardiovascular;  Laterality: N/A;       History of present illness and  Hospital Course:     Kindly see H&P for history of present illness and admission details, please review complete Labs, Consult reports and Test reports for all details in brief  HPI  from the history and physical done on the day of admission 84 year old female patient admitted on May 29 for acute lower GI bleed, patient found to have fresh red blood from rectum and admitted for GI  bleed.   Hospital Course  Acute lower GI bleed: Hemodynamically stable.  Patient is admitted to medical floor, by gastroenterologist, patient had a bleeding scan which showed active bleeding sigmoid colon area.  Patient is seen by vascular surgeon emergently, had embolization of IMA.  After the embolization patient remained hemodynamically stable. 2. Anemia of blood loss, patient hemoglobin 12.4 on admission b patient had a large rectal bleed in the nursing home and 2 in the emergency room so she received 1 unit of packed RBC because of rapid GI bleed.  Patient hemoglobin has been stable 11.7.  #3/ history of Alzheimer's dementia, patient is from [facility], patient is a long-term resident there.  And she was going to be discharged there today. Before GERD: PPIs to be continued 5.  History of chronic A. fib, patient aspirin, Plavix held since admission secondary to GI bleed.  Patient GI bleed resolved, daughter mentioned that she does not like aspirin anyway, resume Plavix, from Monday that is June 3 continue metoprolol 12.5 mg p.o. twice daily.  Discharge Condition: Stable  Follow-up with primary cardiologist Dr. Bartholome Bill in 1 week Follow-up with PCP in 1 week   Discharge Instructions  and  Discharge Medications     Allergies as of 09/16/2017      Reactions   Codeine Sulfate Hives, Itching   Can take tramadol   Codeine Other (See Comments)      Medication List    STOP taking these medications   aspirin  81 MG EC tablet   clopidogrel 75 MG tablet Commonly known as:  PLAVIX     TAKE these medications   acetaminophen 325 MG tablet Commonly known as:  TYLENOL Take 650 mg by mouth every 8 (eight) hours as needed for mild pain or moderate pain.   atorvastatin 40 MG tablet Commonly known as:  LIPITOR Take 1 tablet (40 mg total) by mouth daily at 6 PM.   CALCIUM 500 + D 500-125 MG-UNIT Tabs Generic drug:  Calcium Carbonate-Vitamin D Take 1 tablet by mouth  daily.   cyanocobalamin 1000 MCG tablet Take 1,000 mcg by mouth daily.   D3-50 50000 units capsule Generic drug:  Cholecalciferol Take 50,000 Units by mouth every 30 (thirty) days.   DULoxetine 30 MG capsule Commonly known as:  CYMBALTA Take 30 mg by mouth daily.   fluticasone 50 MCG/ACT nasal spray Commonly known as:  FLONASE Place 2 sprays into both nostrils daily.   levothyroxine 50 MCG tablet Commonly known as:  SYNTHROID, LEVOTHROID Take 50 mcg by mouth daily before breakfast.   losartan 50 MG tablet Commonly known as:  COZAAR Take 50 mg by mouth daily.   metoCLOPramide 10 MG tablet Commonly known as:  REGLAN Take 1 tablet (10 mg total) by mouth every 8 (eight) hours as needed.   metoprolol tartrate 25 MG tablet Commonly known as:  LOPRESSOR Take 0.5 tablets (12.5 mg total) by mouth 2 (two) times daily.   MULTIVITAMIN ADULT PO Take 1 tablet by mouth daily.   ondansetron 4 MG disintegrating tablet Commonly known as:  ZOFRAN ODT Take 1 tablet (4 mg total) by mouth every 8 (eight) hours as needed for nausea or vomiting.   pantoprazole 40 MG tablet Commonly known as:  PROTONIX Take 1 tablet (40 mg total) by mouth daily.   VESICARE 5 MG tablet Generic drug:  solifenacin TAKE 1 TABLET BY MOUTH ONCE A DAY         Diet and Activity recommendation: See Discharge Instructions above   Consults obtained -gastroenterology, vascular surgeon   Major procedures and Radiology Reports - PLEASE review detailed and final reports for all details, in brief -      Nm Gi Blood Loss  Result Date: 09/13/2017 CLINICAL DATA:  Gastrointestinal bleeding EXAM: NUCLEAR MEDICINE GASTROINTESTINAL BLEEDING SCAN TECHNIQUE: Sequential frontal abdominal and pelvic images were obtained following intravenous administration of Tc-17m labeled red blood cells. RADIOPHARMACEUTICALS:  20.572 mCi Tc-80m pertechnetate in-vitro labeled red cells. COMPARISON:  None. FINDINGS: There is an area  of ectopic radiotracer uptake just to the left of midline in the upper pelvic region. This area became more prominent over time. After defecating, this area of radiotracer no longer became apparent. Additional imaging was performed with return of radiotracer uptake in the upper pelvis slightly to the left in the midline. This appearance is felt to represent ectopic bleeding site in the sigmoid colon region. IMPRESSION: Abnormal radiotracer uptake in the upper pelvis as described, felt to represent an intermittently active bleeding site in the sigmoid colon region. Electronically Signed   By: [name] III M.D.   On: 09/13/2017 14:02    Micro Results    Recent Results (from the past 240 hour(s))  MRSA PCR Screening     Status: None   Collection Time: 09/13/17  3:00 PM  Result Value Ref Range Status   MRSA by PCR NEGATIVE NEGATIVE Final    Comment:        The GeneXpert MRSA Assay (FDA approved  for NASAL specimens only), is one component of a comprehensive MRSA colonization surveillance program. It is not intended to diagnose MRSA infection nor to guide or monitor treatment for MRSA infections. Performed at [facility], [address], [place], [place] [address]        Today   Subjective:   Latriece Anstine is stable for discharge today. Objective:   Blood pressure (!) 146/74, pulse 64, temperature 98.2 F (36.8 C), temperature source Oral, resp. rate 18, height 5\' 3"  (1.6 m), weight 62.7 kg (138 lb 4.8 oz), SpO2 96 %.   Intake/Output Summary (Last 24 hours) at 09/16/2017 0756 Last data filed at 09/15/2017 1837 Gross per 24 hour  Intake 240 ml  Output -  Net 240 ml    Exam Patient confused at baseline because of her Alzheimer's dementia., No new F.N deficits, Normal affect [place].AT,PERRAL Supple Neck,No JVD, No cervical lymphadenopathy appriciated.  Symmetrical Chest wall movement, Good air movement bilaterally, CTAB RRR,No Gallops,Rubs or new Murmurs, No  Parasternal Heave +ve B.Sounds, Abd Soft, Non tender, No organomegaly appriciated, No rebound -guarding or rigidity. No Cyanosis, Clubbing or edema, No new Rash or bruise  Data Review   CBC w Diff:  Lab Results  Component Value Date   WBC 6.9 09/15/2017   HGB 11.4 (L) 09/15/2017   HGB 13.0 03/05/2013   HCT 32.6 (L) 09/15/2017   HCT 38.5 03/05/2013   PLT 122 (L) 09/15/2017   PLT 162 03/05/2013   LYMPHOPCT 33 09/13/2017   MONOPCT 9 09/13/2017   EOSPCT 2 09/13/2017   BASOPCT 1 09/13/2017    CMP:  Lab Results  Component Value Date   NA 135 09/14/2017   NA 137 03/05/2013   K 3.6 09/14/2017   K 3.9 03/05/2013   CL 105 09/14/2017   CL 106 03/05/2013   CO2 21 (L) 09/14/2017   CO2 26 03/05/2013   BUN 12 09/14/2017   BUN 16 03/05/2013   CREATININE 0.77 09/14/2017   CREATININE 0.85 03/05/2013   PROT 7.0 09/13/2017   PROT 7.0 10/13/2014   PROT 7.0 03/05/2013   ALBUMIN 4.0 09/13/2017   ALBUMIN 4.5 10/13/2014   ALBUMIN 3.7 03/05/2013   BILITOT 0.7 09/13/2017   BILITOT 0.6 10/13/2014   BILITOT 0.5 03/05/2013   ALKPHOS 47 09/13/2017   ALKPHOS 67 03/05/2013   AST 24 09/13/2017   AST 24 03/05/2013   ALT 18 09/13/2017   ALT 22 03/05/2013  .   Total Time in preparing paper work, data evaluation and todays exam - 32 minutes  Epifanio Lesches M.D on 09/16/2017 at 7:56 AM    Note: This dictation was prepared with Dragon dictation along with smaller phrase technology. Any transcriptional errors that result from this process are unintentional.

## 2017-09-16 NOTE — Progress Notes (Signed)
Pt to be discharged to [facility] today. Iv and tele removed. . Report called to the facility. Transport to be by ems. Daughter at bedisde

## 2017-09-18 DIAGNOSIS — K922 Gastrointestinal hemorrhage, unspecified: Secondary | ICD-10-CM

## 2017-09-18 DIAGNOSIS — F0150 Vascular dementia without behavioral disturbance: Secondary | ICD-10-CM | POA: Diagnosis not present

## 2017-10-31 DIAGNOSIS — K922 Gastrointestinal hemorrhage, unspecified: Secondary | ICD-10-CM

## 2017-10-31 DIAGNOSIS — F0150 Vascular dementia without behavioral disturbance: Secondary | ICD-10-CM | POA: Diagnosis not present

## 2017-10-31 DIAGNOSIS — F39 Unspecified mood [affective] disorder: Secondary | ICD-10-CM | POA: Diagnosis not present

## 2017-10-31 DIAGNOSIS — I6930 Unspecified sequelae of cerebral infarction: Secondary | ICD-10-CM | POA: Diagnosis not present

## 2017-10-31 DIAGNOSIS — E441 Mild protein-calorie malnutrition: Secondary | ICD-10-CM | POA: Diagnosis not present

## 2017-12-14 DIAGNOSIS — E441 Mild protein-calorie malnutrition: Secondary | ICD-10-CM | POA: Diagnosis not present

## 2018-01-05 DIAGNOSIS — K922 Gastrointestinal hemorrhage, unspecified: Secondary | ICD-10-CM

## 2018-01-05 DIAGNOSIS — F39 Unspecified mood [affective] disorder: Secondary | ICD-10-CM

## 2018-01-05 DIAGNOSIS — I69398 Other sequelae of cerebral infarction: Secondary | ICD-10-CM | POA: Diagnosis not present

## 2018-01-05 DIAGNOSIS — E039 Hypothyroidism, unspecified: Secondary | ICD-10-CM

## 2018-01-05 DIAGNOSIS — M1990 Unspecified osteoarthritis, unspecified site: Secondary | ICD-10-CM | POA: Diagnosis not present

## 2018-01-05 DIAGNOSIS — F0150 Vascular dementia without behavioral disturbance: Secondary | ICD-10-CM

## 2018-01-05 DIAGNOSIS — I10 Essential (primary) hypertension: Secondary | ICD-10-CM | POA: Diagnosis not present

## 2018-01-19 DIAGNOSIS — R4182 Altered mental status, unspecified: Secondary | ICD-10-CM | POA: Diagnosis not present

## 2018-01-19 DIAGNOSIS — R231 Pallor: Secondary | ICD-10-CM

## 2018-01-19 DIAGNOSIS — R1110 Vomiting, unspecified: Secondary | ICD-10-CM | POA: Diagnosis not present

## 2018-01-22 DIAGNOSIS — R110 Nausea: Secondary | ICD-10-CM | POA: Diagnosis not present

## 2018-02-27 DIAGNOSIS — F0150 Vascular dementia without behavioral disturbance: Secondary | ICD-10-CM | POA: Diagnosis not present

## 2018-02-27 DIAGNOSIS — E441 Mild protein-calorie malnutrition: Secondary | ICD-10-CM | POA: Diagnosis not present

## 2018-02-27 DIAGNOSIS — F39 Unspecified mood [affective] disorder: Secondary | ICD-10-CM

## 2018-02-27 DIAGNOSIS — I6930 Unspecified sequelae of cerebral infarction: Secondary | ICD-10-CM | POA: Diagnosis not present

## 2018-05-09 DIAGNOSIS — K922 Gastrointestinal hemorrhage, unspecified: Secondary | ICD-10-CM

## 2018-05-09 DIAGNOSIS — F0150 Vascular dementia without behavioral disturbance: Secondary | ICD-10-CM

## 2018-05-09 DIAGNOSIS — I69398 Other sequelae of cerebral infarction: Secondary | ICD-10-CM

## 2018-05-09 DIAGNOSIS — F39 Unspecified mood [affective] disorder: Secondary | ICD-10-CM

## 2018-05-09 DIAGNOSIS — I10 Essential (primary) hypertension: Secondary | ICD-10-CM

## 2018-05-09 DIAGNOSIS — E039 Hypothyroidism, unspecified: Secondary | ICD-10-CM

## 2018-05-09 DIAGNOSIS — E43 Unspecified severe protein-calorie malnutrition: Secondary | ICD-10-CM

## 2018-07-17 DIAGNOSIS — I6930 Unspecified sequelae of cerebral infarction: Secondary | ICD-10-CM | POA: Diagnosis not present

## 2018-07-17 DIAGNOSIS — F0150 Vascular dementia without behavioral disturbance: Secondary | ICD-10-CM | POA: Diagnosis not present

## 2018-07-17 DIAGNOSIS — I10 Essential (primary) hypertension: Secondary | ICD-10-CM

## 2018-07-17 DIAGNOSIS — F39 Unspecified mood [affective] disorder: Secondary | ICD-10-CM | POA: Diagnosis not present

## 2018-07-17 DIAGNOSIS — E441 Mild protein-calorie malnutrition: Secondary | ICD-10-CM

## 2018-09-12 DIAGNOSIS — E039 Hypothyroidism, unspecified: Secondary | ICD-10-CM | POA: Diagnosis not present

## 2018-09-12 DIAGNOSIS — F39 Unspecified mood [affective] disorder: Secondary | ICD-10-CM

## 2018-09-12 DIAGNOSIS — I69398 Other sequelae of cerebral infarction: Secondary | ICD-10-CM

## 2018-09-12 DIAGNOSIS — I10 Essential (primary) hypertension: Secondary | ICD-10-CM | POA: Diagnosis not present

## 2018-09-12 DIAGNOSIS — K219 Gastro-esophageal reflux disease without esophagitis: Secondary | ICD-10-CM

## 2018-09-12 DIAGNOSIS — E43 Unspecified severe protein-calorie malnutrition: Secondary | ICD-10-CM

## 2018-09-12 DIAGNOSIS — F0150 Vascular dementia without behavioral disturbance: Secondary | ICD-10-CM

## 2018-11-05 DIAGNOSIS — I6930 Unspecified sequelae of cerebral infarction: Secondary | ICD-10-CM | POA: Diagnosis not present

## 2018-11-05 DIAGNOSIS — I10 Essential (primary) hypertension: Secondary | ICD-10-CM | POA: Diagnosis not present

## 2018-11-05 DIAGNOSIS — K219 Gastro-esophageal reflux disease without esophagitis: Secondary | ICD-10-CM | POA: Diagnosis not present

## 2018-11-05 DIAGNOSIS — F0150 Vascular dementia without behavioral disturbance: Secondary | ICD-10-CM

## 2018-11-05 DIAGNOSIS — F39 Unspecified mood [affective] disorder: Secondary | ICD-10-CM

## 2019-01-11 DIAGNOSIS — F0150 Vascular dementia without behavioral disturbance: Secondary | ICD-10-CM

## 2019-01-11 DIAGNOSIS — E039 Hypothyroidism, unspecified: Secondary | ICD-10-CM

## 2019-01-11 DIAGNOSIS — K219 Gastro-esophageal reflux disease without esophagitis: Secondary | ICD-10-CM

## 2019-01-11 DIAGNOSIS — I10 Essential (primary) hypertension: Secondary | ICD-10-CM

## 2019-01-11 DIAGNOSIS — F39 Unspecified mood [affective] disorder: Secondary | ICD-10-CM

## 2019-01-11 DIAGNOSIS — E43 Unspecified severe protein-calorie malnutrition: Secondary | ICD-10-CM

## 2019-01-11 DIAGNOSIS — M1990 Unspecified osteoarthritis, unspecified site: Secondary | ICD-10-CM

## 2019-01-11 DIAGNOSIS — I69359 Hemiplegia and hemiparesis following cerebral infarction affecting unspecified side: Secondary | ICD-10-CM

## 2019-01-14 DIAGNOSIS — M79673 Pain in unspecified foot: Secondary | ICD-10-CM

## 2019-03-07 DIAGNOSIS — I69398 Other sequelae of cerebral infarction: Secondary | ICD-10-CM | POA: Diagnosis not present

## 2019-03-07 DIAGNOSIS — F0150 Vascular dementia without behavioral disturbance: Secondary | ICD-10-CM | POA: Diagnosis not present

## 2019-03-07 DIAGNOSIS — I10 Essential (primary) hypertension: Secondary | ICD-10-CM

## 2019-03-07 DIAGNOSIS — D696 Thrombocytopenia, unspecified: Secondary | ICD-10-CM | POA: Diagnosis not present

## 2019-03-07 DIAGNOSIS — F39 Unspecified mood [affective] disorder: Secondary | ICD-10-CM

## 2019-03-07 DIAGNOSIS — K219 Gastro-esophageal reflux disease without esophagitis: Secondary | ICD-10-CM

## 2019-03-07 DIAGNOSIS — N183 Chronic kidney disease, stage 3 unspecified: Secondary | ICD-10-CM

## 2019-03-28 DIAGNOSIS — L03039 Cellulitis of unspecified toe: Secondary | ICD-10-CM | POA: Diagnosis not present

## 2019-05-08 DIAGNOSIS — D696 Thrombocytopenia, unspecified: Secondary | ICD-10-CM

## 2019-05-08 DIAGNOSIS — I10 Essential (primary) hypertension: Secondary | ICD-10-CM | POA: Diagnosis not present

## 2019-05-08 DIAGNOSIS — N183 Chronic kidney disease, stage 3 unspecified: Secondary | ICD-10-CM

## 2019-05-08 DIAGNOSIS — F0150 Vascular dementia without behavioral disturbance: Secondary | ICD-10-CM | POA: Diagnosis not present

## 2019-05-08 DIAGNOSIS — E43 Unspecified severe protein-calorie malnutrition: Secondary | ICD-10-CM

## 2019-05-08 DIAGNOSIS — M1990 Unspecified osteoarthritis, unspecified site: Secondary | ICD-10-CM

## 2019-05-08 DIAGNOSIS — I69359 Hemiplegia and hemiparesis following cerebral infarction affecting unspecified side: Secondary | ICD-10-CM | POA: Diagnosis not present

## 2019-05-08 DIAGNOSIS — E039 Hypothyroidism, unspecified: Secondary | ICD-10-CM

## 2019-05-08 DIAGNOSIS — F39 Unspecified mood [affective] disorder: Secondary | ICD-10-CM | POA: Diagnosis not present

## 2019-05-08 DIAGNOSIS — K219 Gastro-esophageal reflux disease without esophagitis: Secondary | ICD-10-CM

## 2019-07-16 DIAGNOSIS — F39 Unspecified mood [affective] disorder: Secondary | ICD-10-CM | POA: Diagnosis not present

## 2019-07-16 DIAGNOSIS — F0150 Vascular dementia without behavioral disturbance: Secondary | ICD-10-CM

## 2019-07-16 DIAGNOSIS — I6930 Unspecified sequelae of cerebral infarction: Secondary | ICD-10-CM | POA: Diagnosis not present

## 2019-07-16 DIAGNOSIS — D696 Thrombocytopenia, unspecified: Secondary | ICD-10-CM | POA: Diagnosis not present

## 2019-08-06 IMAGING — CT CT CERVICAL SPINE W/O CM
4 of 7 series · 14 of 33 positions shown, 15 images · non-contrast
Comparison: Brain imaging 12/10/2016 MR, 12/09/2016 CT. CT cervical
spine 12/19/2015.

CLINICAL DATA: Imbalance. Fell and hit head. No reported loss of
consciousness. Unspecified neck pain.

EXAM:
CT HEAD WITHOUT CONTRAST
CT CERVICAL SPINE WITHOUT CONTRAST
TECHNIQUE: Multidetector CT imaging of the head and cervical spine was
performed following the standard protocol without intravenous
contrast. Multiplanar CT image reconstructions of the cervical spine
were also generated.

[Series 7: c spine soft · axial · 0.28mm/px · z∈[-242,-146]mm · 4 of 82 slices shown]
[im 17/82  soft-tissue]
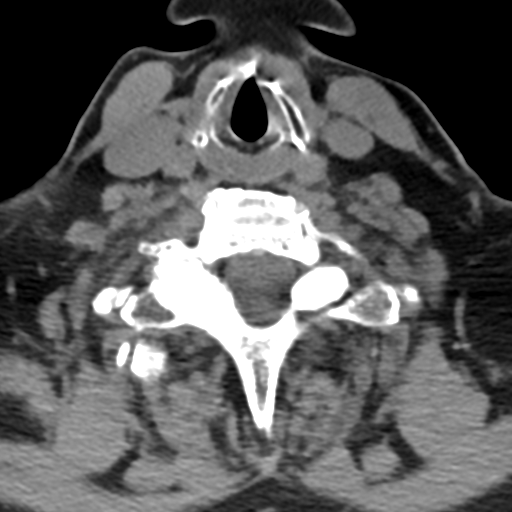
[im 33/82  soft-tissue]
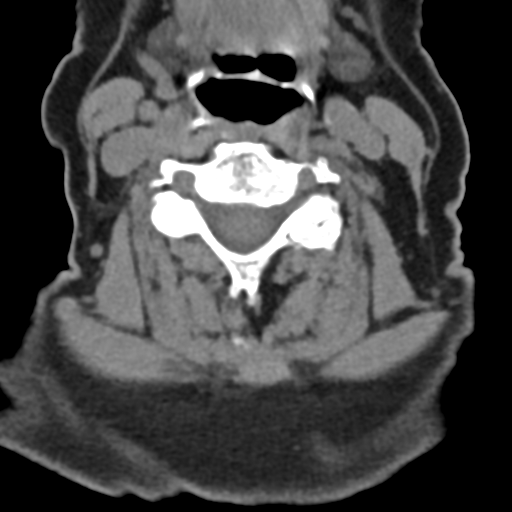
[im 49/82  soft-tissue]
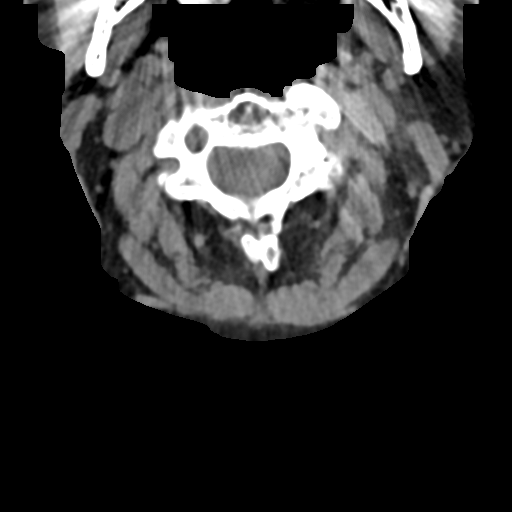
[im 65/82  soft-tissue]
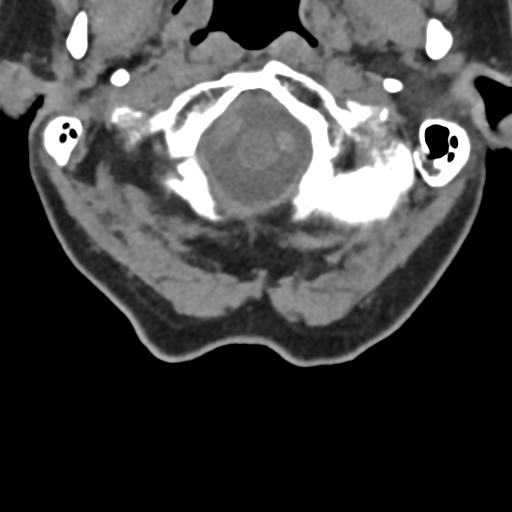

[Series 8: sagittal bone · sagittal · 0.27mm/px · 5 of 51 slices shown]
[im 9/51  bone]
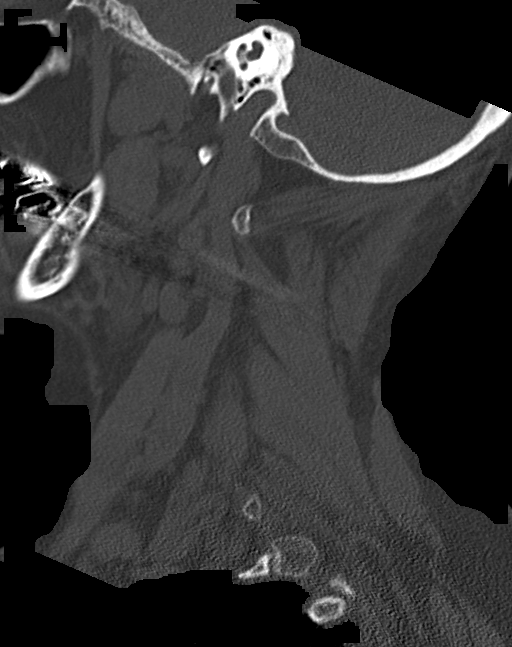
[im 17/51  bone]
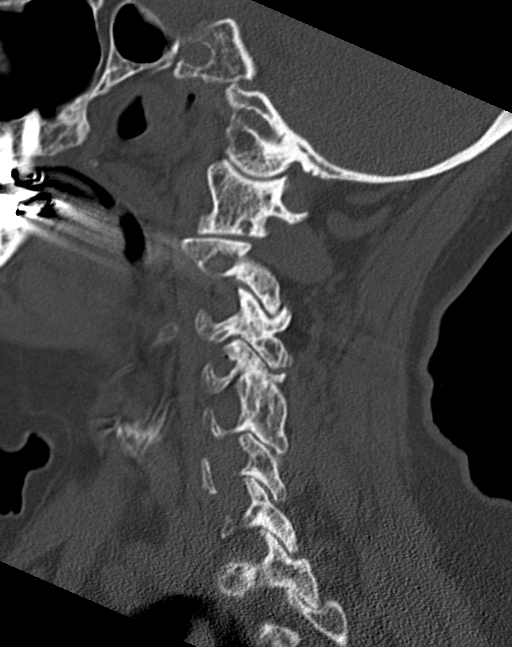
[im 26/51  bone]
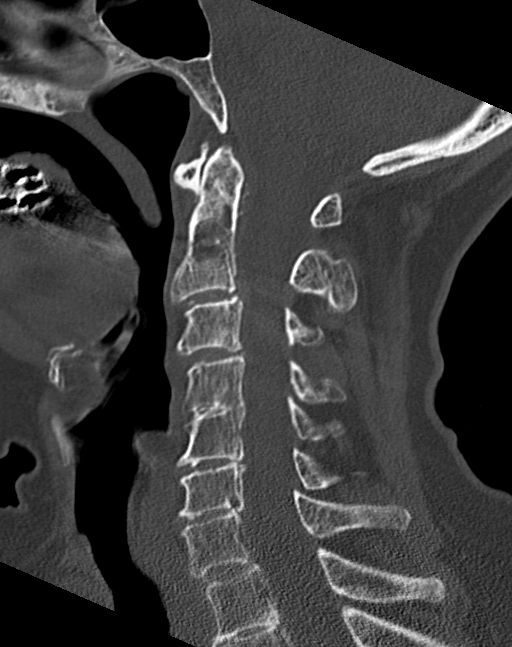
[im 34/51  bone]
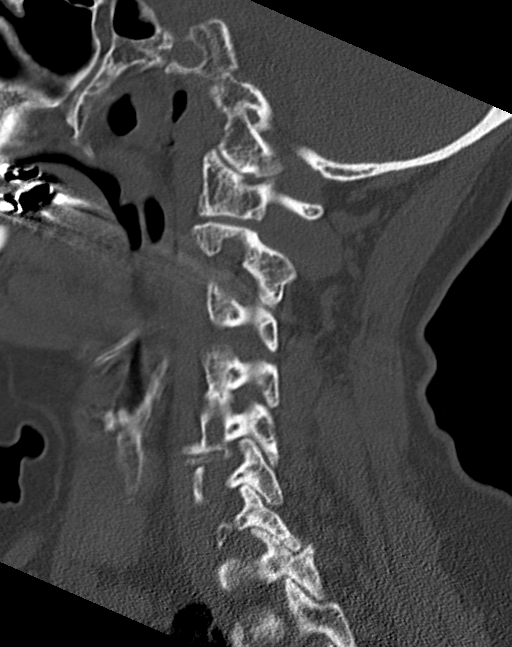
[im 42/51  bone]
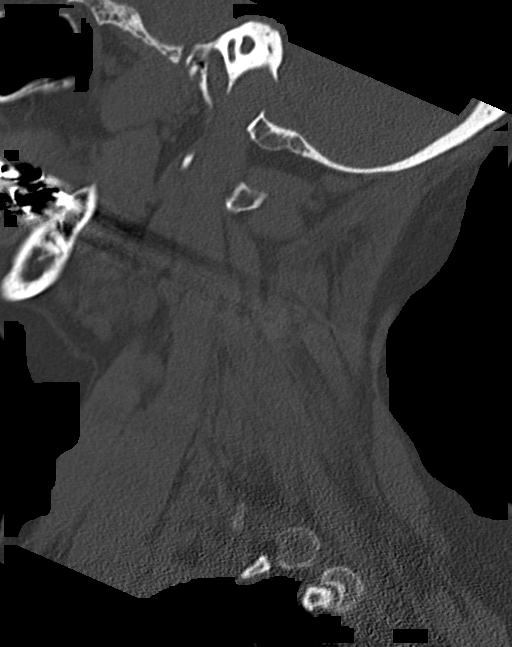

[Series 9: coronal bone · coronal · 0.27mm/px · 1 of 54 slices shown]
[im 27/54  bone]
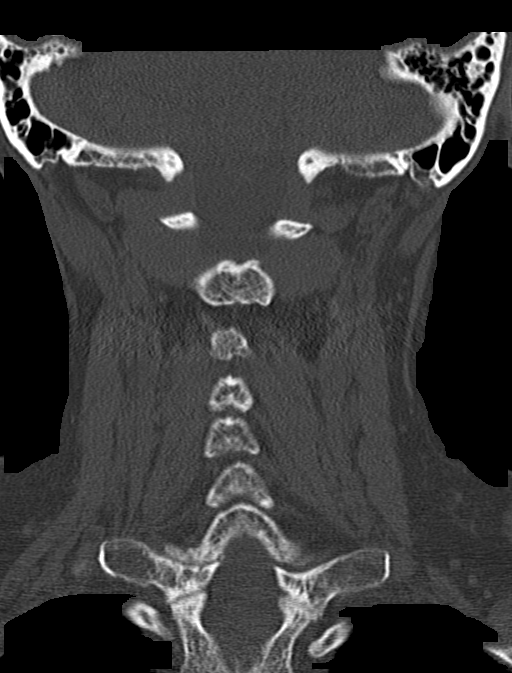

[Series 10: orthogonal bone · axial · 0.21mm/px · z∈[-258,-165]mm · 4 of 84 slices shown, 5 images]
[im 17/84  soft-tissue]
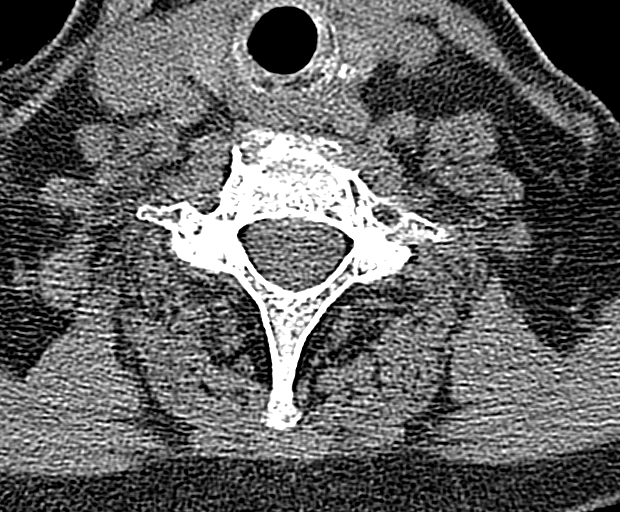
[im 17/84  bone]
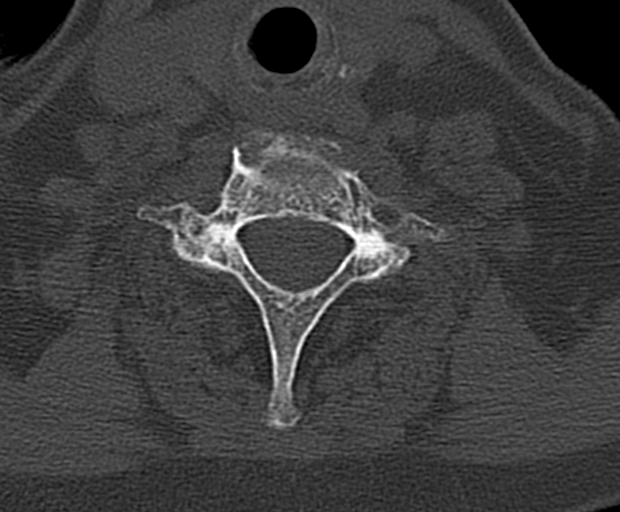
[im 34/84  bone]
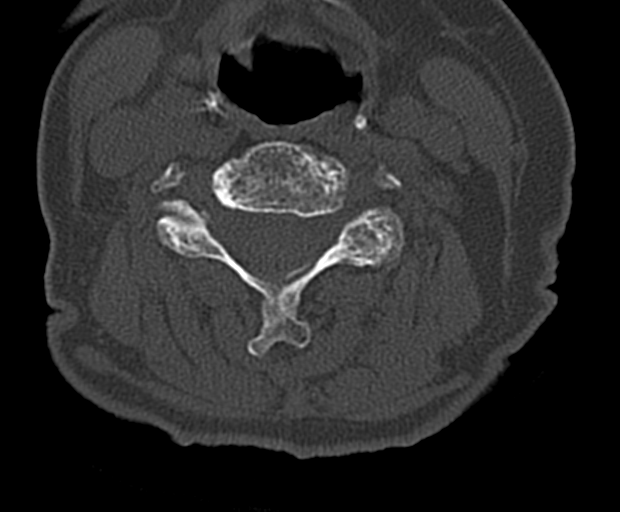
[im 50/84  bone]
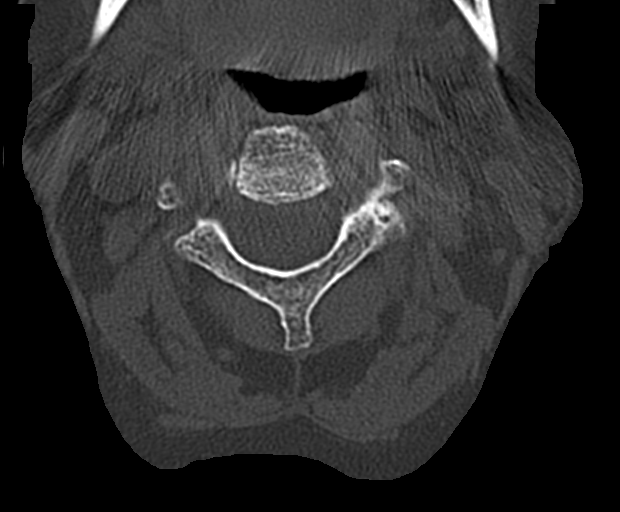
[im 67/84  bone]
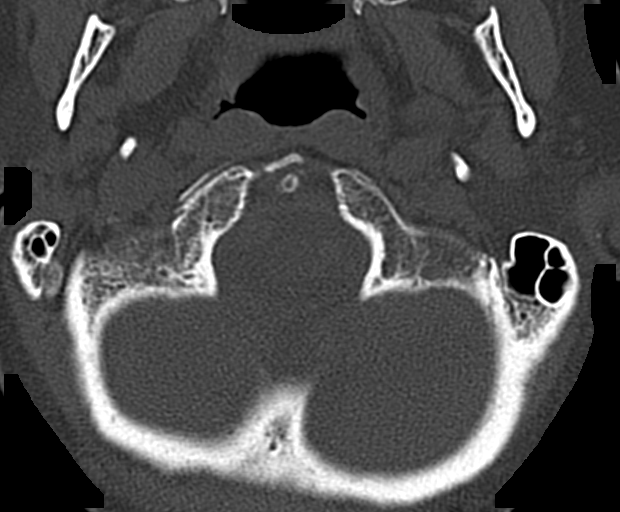

[14 of 33 positions shown; findings below may reference images not displayed]

FINDINGS: CT HEAD FINDINGS

Brain: No evidence for acute infarction, hemorrhage, mass lesion,
hydrocephalus, or extra-axial fluid. Generalized atrophy.
Hypoattenuation of white matter represent small vessel disease.

Vascular: Calcification of the cavernous internal carotid arteries
consistent with cerebrovascular atherosclerotic disease. No signs of
intracranial large vessel occlusion.

Skull: Normal. Negative for fracture or focal lesion.

Sinuses/Orbits: No acute finding.

Other:  Similar appearance to priors.

CT CERVICAL SPINE FINDINGS

Alignment: No traumatic subluxation. Degenerative anterolisthesis
C2-3, C3-4, and C7-T1, non worrisome.

Skull base and vertebrae: No acute fracture. No primary bone lesion
or focal pathologic process.

Soft tissues and spinal canal: No prevertebral fluid or swelling. No
visible canal hematoma.

Disc levels: Multilevel spondylosis. Spontaneous fusion C4-C5.
Advanced disc space narrowing C5-6 and C6-7. Degenerative slip C2-3,
C3-4, and C7-T1. Probable small central protrusion C2-C3. Multilevel
facet arthropathy.

Upper chest: No pneumothorax.

Other: Similar appearance to priors.
IMPRESSION: No skull fracture or intracranial hemorrhage. Stable atrophy with
small vessel disease.

No cervical spine fracture or traumatic subluxation. Stable moderate
multilevel spondylosis.

## 2019-09-06 DIAGNOSIS — F0150 Vascular dementia without behavioral disturbance: Secondary | ICD-10-CM

## 2019-09-06 DIAGNOSIS — D696 Thrombocytopenia, unspecified: Secondary | ICD-10-CM

## 2019-09-06 DIAGNOSIS — E039 Hypothyroidism, unspecified: Secondary | ICD-10-CM

## 2019-09-06 DIAGNOSIS — E43 Unspecified severe protein-calorie malnutrition: Secondary | ICD-10-CM

## 2019-09-06 DIAGNOSIS — I69359 Hemiplegia and hemiparesis following cerebral infarction affecting unspecified side: Secondary | ICD-10-CM

## 2019-09-06 DIAGNOSIS — M1990 Unspecified osteoarthritis, unspecified site: Secondary | ICD-10-CM

## 2019-09-06 DIAGNOSIS — I10 Essential (primary) hypertension: Secondary | ICD-10-CM

## 2019-09-06 DIAGNOSIS — N183 Chronic kidney disease, stage 3 unspecified: Secondary | ICD-10-CM

## 2019-09-06 DIAGNOSIS — F39 Unspecified mood [affective] disorder: Secondary | ICD-10-CM

## 2019-09-06 DIAGNOSIS — K219 Gastro-esophageal reflux disease without esophagitis: Secondary | ICD-10-CM

## 2019-11-14 DIAGNOSIS — I6930 Unspecified sequelae of cerebral infarction: Secondary | ICD-10-CM

## 2019-11-14 DIAGNOSIS — F39 Unspecified mood [affective] disorder: Secondary | ICD-10-CM | POA: Diagnosis not present

## 2019-11-14 DIAGNOSIS — N1831 Chronic kidney disease, stage 3a: Secondary | ICD-10-CM

## 2019-11-14 DIAGNOSIS — D696 Thrombocytopenia, unspecified: Secondary | ICD-10-CM | POA: Diagnosis not present

## 2019-11-14 DIAGNOSIS — F0150 Vascular dementia without behavioral disturbance: Secondary | ICD-10-CM | POA: Diagnosis not present

## 2020-01-08 DIAGNOSIS — N183 Chronic kidney disease, stage 3 unspecified: Secondary | ICD-10-CM

## 2020-01-08 DIAGNOSIS — I69359 Hemiplegia and hemiparesis following cerebral infarction affecting unspecified side: Secondary | ICD-10-CM

## 2020-01-08 DIAGNOSIS — M1990 Unspecified osteoarthritis, unspecified site: Secondary | ICD-10-CM

## 2020-01-08 DIAGNOSIS — F0150 Vascular dementia without behavioral disturbance: Secondary | ICD-10-CM | POA: Diagnosis not present

## 2020-01-08 DIAGNOSIS — F39 Unspecified mood [affective] disorder: Secondary | ICD-10-CM

## 2020-01-08 DIAGNOSIS — K219 Gastro-esophageal reflux disease without esophagitis: Secondary | ICD-10-CM

## 2020-01-08 DIAGNOSIS — E039 Hypothyroidism, unspecified: Secondary | ICD-10-CM

## 2020-01-08 DIAGNOSIS — I10 Essential (primary) hypertension: Secondary | ICD-10-CM | POA: Diagnosis not present

## 2020-01-08 DIAGNOSIS — D696 Thrombocytopenia, unspecified: Secondary | ICD-10-CM

## 2020-01-08 DIAGNOSIS — E43 Unspecified severe protein-calorie malnutrition: Secondary | ICD-10-CM

## 2020-02-28 DIAGNOSIS — Z23 Encounter for immunization: Secondary | ICD-10-CM | POA: Diagnosis not present

## 2020-03-05 DIAGNOSIS — F0150 Vascular dementia without behavioral disturbance: Secondary | ICD-10-CM

## 2020-03-05 DIAGNOSIS — F39 Unspecified mood [affective] disorder: Secondary | ICD-10-CM

## 2020-03-05 DIAGNOSIS — E039 Hypothyroidism, unspecified: Secondary | ICD-10-CM

## 2020-03-05 DIAGNOSIS — I6930 Unspecified sequelae of cerebral infarction: Secondary | ICD-10-CM

## 2020-05-01 DIAGNOSIS — N183 Chronic kidney disease, stage 3 unspecified: Secondary | ICD-10-CM | POA: Diagnosis not present

## 2020-05-01 DIAGNOSIS — F39 Unspecified mood [affective] disorder: Secondary | ICD-10-CM | POA: Diagnosis not present

## 2020-05-01 DIAGNOSIS — I69359 Hemiplegia and hemiparesis following cerebral infarction affecting unspecified side: Secondary | ICD-10-CM | POA: Diagnosis not present

## 2020-05-01 DIAGNOSIS — K219 Gastro-esophageal reflux disease without esophagitis: Secondary | ICD-10-CM | POA: Diagnosis not present

## 2020-05-01 DIAGNOSIS — I10 Essential (primary) hypertension: Secondary | ICD-10-CM | POA: Diagnosis not present

## 2020-05-01 DIAGNOSIS — F0150 Vascular dementia without behavioral disturbance: Secondary | ICD-10-CM | POA: Diagnosis not present

## 2020-05-06 DIAGNOSIS — G2581 Restless legs syndrome: Secondary | ICD-10-CM | POA: Diagnosis not present

## 2020-05-06 DIAGNOSIS — R2689 Other abnormalities of gait and mobility: Secondary | ICD-10-CM | POA: Diagnosis not present

## 2020-05-06 DIAGNOSIS — I10 Essential (primary) hypertension: Secondary | ICD-10-CM | POA: Diagnosis not present

## 2020-05-06 DIAGNOSIS — I69320 Aphasia following cerebral infarction: Secondary | ICD-10-CM | POA: Diagnosis not present

## 2020-05-06 DIAGNOSIS — R4189 Other symptoms and signs involving cognitive functions and awareness: Secondary | ICD-10-CM | POA: Diagnosis not present

## 2020-05-06 DIAGNOSIS — R262 Difficulty in walking, not elsewhere classified: Secondary | ICD-10-CM | POA: Diagnosis not present

## 2020-05-06 DIAGNOSIS — I69351 Hemiplegia and hemiparesis following cerebral infarction affecting right dominant side: Secondary | ICD-10-CM | POA: Diagnosis not present

## 2020-05-06 DIAGNOSIS — I129 Hypertensive chronic kidney disease with stage 1 through stage 4 chronic kidney disease, or unspecified chronic kidney disease: Secondary | ICD-10-CM | POA: Diagnosis not present

## 2020-05-06 DIAGNOSIS — I69920 Aphasia following unspecified cerebrovascular disease: Secondary | ICD-10-CM | POA: Diagnosis not present

## 2020-05-06 DIAGNOSIS — F0150 Vascular dementia without behavioral disturbance: Secondary | ICD-10-CM | POA: Diagnosis not present

## 2020-05-06 DIAGNOSIS — Z741 Need for assistance with personal care: Secondary | ICD-10-CM | POA: Diagnosis not present

## 2020-05-06 DIAGNOSIS — W19XXXA Unspecified fall, initial encounter: Secondary | ICD-10-CM | POA: Diagnosis not present

## 2020-05-06 DIAGNOSIS — R2681 Unsteadiness on feet: Secondary | ICD-10-CM | POA: Diagnosis not present

## 2020-05-07 DIAGNOSIS — I69351 Hemiplegia and hemiparesis following cerebral infarction affecting right dominant side: Secondary | ICD-10-CM | POA: Diagnosis not present

## 2020-05-07 DIAGNOSIS — F0150 Vascular dementia without behavioral disturbance: Secondary | ICD-10-CM | POA: Diagnosis not present

## 2020-05-07 DIAGNOSIS — R2681 Unsteadiness on feet: Secondary | ICD-10-CM | POA: Diagnosis not present

## 2020-05-07 DIAGNOSIS — I10 Essential (primary) hypertension: Secondary | ICD-10-CM | POA: Diagnosis not present

## 2020-05-07 DIAGNOSIS — I69320 Aphasia following cerebral infarction: Secondary | ICD-10-CM | POA: Diagnosis not present

## 2020-05-07 DIAGNOSIS — G2581 Restless legs syndrome: Secondary | ICD-10-CM | POA: Diagnosis not present

## 2020-05-10 DIAGNOSIS — R2681 Unsteadiness on feet: Secondary | ICD-10-CM | POA: Diagnosis not present

## 2020-05-10 DIAGNOSIS — I10 Essential (primary) hypertension: Secondary | ICD-10-CM | POA: Diagnosis not present

## 2020-05-10 DIAGNOSIS — F0150 Vascular dementia without behavioral disturbance: Secondary | ICD-10-CM | POA: Diagnosis not present

## 2020-05-10 DIAGNOSIS — I69351 Hemiplegia and hemiparesis following cerebral infarction affecting right dominant side: Secondary | ICD-10-CM | POA: Diagnosis not present

## 2020-05-10 DIAGNOSIS — I69320 Aphasia following cerebral infarction: Secondary | ICD-10-CM | POA: Diagnosis not present

## 2020-05-10 DIAGNOSIS — G2581 Restless legs syndrome: Secondary | ICD-10-CM | POA: Diagnosis not present

## 2020-05-11 DIAGNOSIS — F0150 Vascular dementia without behavioral disturbance: Secondary | ICD-10-CM | POA: Diagnosis not present

## 2020-05-11 DIAGNOSIS — I10 Essential (primary) hypertension: Secondary | ICD-10-CM | POA: Diagnosis not present

## 2020-05-11 DIAGNOSIS — I69320 Aphasia following cerebral infarction: Secondary | ICD-10-CM | POA: Diagnosis not present

## 2020-05-11 DIAGNOSIS — I69351 Hemiplegia and hemiparesis following cerebral infarction affecting right dominant side: Secondary | ICD-10-CM | POA: Diagnosis not present

## 2020-05-11 DIAGNOSIS — R2681 Unsteadiness on feet: Secondary | ICD-10-CM | POA: Diagnosis not present

## 2020-05-11 DIAGNOSIS — G2581 Restless legs syndrome: Secondary | ICD-10-CM | POA: Diagnosis not present

## 2020-05-12 DIAGNOSIS — F0150 Vascular dementia without behavioral disturbance: Secondary | ICD-10-CM | POA: Diagnosis not present

## 2020-05-12 DIAGNOSIS — I69320 Aphasia following cerebral infarction: Secondary | ICD-10-CM | POA: Diagnosis not present

## 2020-05-12 DIAGNOSIS — G2581 Restless legs syndrome: Secondary | ICD-10-CM | POA: Diagnosis not present

## 2020-05-12 DIAGNOSIS — R2681 Unsteadiness on feet: Secondary | ICD-10-CM | POA: Diagnosis not present

## 2020-05-12 DIAGNOSIS — I10 Essential (primary) hypertension: Secondary | ICD-10-CM | POA: Diagnosis not present

## 2020-05-12 DIAGNOSIS — I69351 Hemiplegia and hemiparesis following cerebral infarction affecting right dominant side: Secondary | ICD-10-CM | POA: Diagnosis not present

## 2020-05-13 DIAGNOSIS — I10 Essential (primary) hypertension: Secondary | ICD-10-CM | POA: Diagnosis not present

## 2020-05-13 DIAGNOSIS — F0150 Vascular dementia without behavioral disturbance: Secondary | ICD-10-CM | POA: Diagnosis not present

## 2020-05-13 DIAGNOSIS — I69320 Aphasia following cerebral infarction: Secondary | ICD-10-CM | POA: Diagnosis not present

## 2020-05-13 DIAGNOSIS — G2581 Restless legs syndrome: Secondary | ICD-10-CM | POA: Diagnosis not present

## 2020-05-13 DIAGNOSIS — R2681 Unsteadiness on feet: Secondary | ICD-10-CM | POA: Diagnosis not present

## 2020-05-13 DIAGNOSIS — I69351 Hemiplegia and hemiparesis following cerebral infarction affecting right dominant side: Secondary | ICD-10-CM | POA: Diagnosis not present

## 2020-05-14 DIAGNOSIS — I69351 Hemiplegia and hemiparesis following cerebral infarction affecting right dominant side: Secondary | ICD-10-CM | POA: Diagnosis not present

## 2020-05-14 DIAGNOSIS — F0150 Vascular dementia without behavioral disturbance: Secondary | ICD-10-CM | POA: Diagnosis not present

## 2020-05-14 DIAGNOSIS — I10 Essential (primary) hypertension: Secondary | ICD-10-CM | POA: Diagnosis not present

## 2020-05-14 DIAGNOSIS — G2581 Restless legs syndrome: Secondary | ICD-10-CM | POA: Diagnosis not present

## 2020-05-14 DIAGNOSIS — R2681 Unsteadiness on feet: Secondary | ICD-10-CM | POA: Diagnosis not present

## 2020-05-14 DIAGNOSIS — I69320 Aphasia following cerebral infarction: Secondary | ICD-10-CM | POA: Diagnosis not present

## 2020-05-16 DIAGNOSIS — R2681 Unsteadiness on feet: Secondary | ICD-10-CM | POA: Diagnosis not present

## 2020-05-16 DIAGNOSIS — G2581 Restless legs syndrome: Secondary | ICD-10-CM | POA: Diagnosis not present

## 2020-05-16 DIAGNOSIS — I69351 Hemiplegia and hemiparesis following cerebral infarction affecting right dominant side: Secondary | ICD-10-CM | POA: Diagnosis not present

## 2020-05-16 DIAGNOSIS — I10 Essential (primary) hypertension: Secondary | ICD-10-CM | POA: Diagnosis not present

## 2020-05-16 DIAGNOSIS — I69320 Aphasia following cerebral infarction: Secondary | ICD-10-CM | POA: Diagnosis not present

## 2020-05-16 DIAGNOSIS — F0150 Vascular dementia without behavioral disturbance: Secondary | ICD-10-CM | POA: Diagnosis not present

## 2020-05-19 DIAGNOSIS — I69351 Hemiplegia and hemiparesis following cerebral infarction affecting right dominant side: Secondary | ICD-10-CM | POA: Diagnosis not present

## 2020-05-19 DIAGNOSIS — I69320 Aphasia following cerebral infarction: Secondary | ICD-10-CM | POA: Diagnosis not present

## 2020-05-19 DIAGNOSIS — W19XXXA Unspecified fall, initial encounter: Secondary | ICD-10-CM | POA: Diagnosis not present

## 2020-05-19 DIAGNOSIS — Z741 Need for assistance with personal care: Secondary | ICD-10-CM | POA: Diagnosis not present

## 2020-05-19 DIAGNOSIS — F0150 Vascular dementia without behavioral disturbance: Secondary | ICD-10-CM | POA: Diagnosis not present

## 2020-05-19 DIAGNOSIS — G2581 Restless legs syndrome: Secondary | ICD-10-CM | POA: Diagnosis not present

## 2020-05-19 DIAGNOSIS — R2689 Other abnormalities of gait and mobility: Secondary | ICD-10-CM | POA: Diagnosis not present

## 2020-05-19 DIAGNOSIS — R4189 Other symptoms and signs involving cognitive functions and awareness: Secondary | ICD-10-CM | POA: Diagnosis not present

## 2020-05-19 DIAGNOSIS — I10 Essential (primary) hypertension: Secondary | ICD-10-CM | POA: Diagnosis not present

## 2020-05-19 DIAGNOSIS — R2681 Unsteadiness on feet: Secondary | ICD-10-CM | POA: Diagnosis not present

## 2020-05-20 DIAGNOSIS — I69320 Aphasia following cerebral infarction: Secondary | ICD-10-CM | POA: Diagnosis not present

## 2020-05-20 DIAGNOSIS — G2581 Restless legs syndrome: Secondary | ICD-10-CM | POA: Diagnosis not present

## 2020-05-20 DIAGNOSIS — R2681 Unsteadiness on feet: Secondary | ICD-10-CM | POA: Diagnosis not present

## 2020-05-20 DIAGNOSIS — I69351 Hemiplegia and hemiparesis following cerebral infarction affecting right dominant side: Secondary | ICD-10-CM | POA: Diagnosis not present

## 2020-05-20 DIAGNOSIS — F0150 Vascular dementia without behavioral disturbance: Secondary | ICD-10-CM | POA: Diagnosis not present

## 2020-05-20 DIAGNOSIS — I10 Essential (primary) hypertension: Secondary | ICD-10-CM | POA: Diagnosis not present

## 2020-05-21 DIAGNOSIS — R2681 Unsteadiness on feet: Secondary | ICD-10-CM | POA: Diagnosis not present

## 2020-05-21 DIAGNOSIS — I69320 Aphasia following cerebral infarction: Secondary | ICD-10-CM | POA: Diagnosis not present

## 2020-05-21 DIAGNOSIS — I10 Essential (primary) hypertension: Secondary | ICD-10-CM | POA: Diagnosis not present

## 2020-05-21 DIAGNOSIS — I69351 Hemiplegia and hemiparesis following cerebral infarction affecting right dominant side: Secondary | ICD-10-CM | POA: Diagnosis not present

## 2020-05-21 DIAGNOSIS — G2581 Restless legs syndrome: Secondary | ICD-10-CM | POA: Diagnosis not present

## 2020-05-21 DIAGNOSIS — F0150 Vascular dementia without behavioral disturbance: Secondary | ICD-10-CM | POA: Diagnosis not present

## 2020-05-23 DIAGNOSIS — I10 Essential (primary) hypertension: Secondary | ICD-10-CM | POA: Diagnosis not present

## 2020-05-23 DIAGNOSIS — G2581 Restless legs syndrome: Secondary | ICD-10-CM | POA: Diagnosis not present

## 2020-05-23 DIAGNOSIS — F0150 Vascular dementia without behavioral disturbance: Secondary | ICD-10-CM | POA: Diagnosis not present

## 2020-05-23 DIAGNOSIS — I69351 Hemiplegia and hemiparesis following cerebral infarction affecting right dominant side: Secondary | ICD-10-CM | POA: Diagnosis not present

## 2020-05-23 DIAGNOSIS — R2681 Unsteadiness on feet: Secondary | ICD-10-CM | POA: Diagnosis not present

## 2020-05-23 DIAGNOSIS — I69320 Aphasia following cerebral infarction: Secondary | ICD-10-CM | POA: Diagnosis not present

## 2020-05-24 DIAGNOSIS — I69320 Aphasia following cerebral infarction: Secondary | ICD-10-CM | POA: Diagnosis not present

## 2020-05-24 DIAGNOSIS — I69351 Hemiplegia and hemiparesis following cerebral infarction affecting right dominant side: Secondary | ICD-10-CM | POA: Diagnosis not present

## 2020-05-24 DIAGNOSIS — R2681 Unsteadiness on feet: Secondary | ICD-10-CM | POA: Diagnosis not present

## 2020-05-24 DIAGNOSIS — G2581 Restless legs syndrome: Secondary | ICD-10-CM | POA: Diagnosis not present

## 2020-05-24 DIAGNOSIS — F0150 Vascular dementia without behavioral disturbance: Secondary | ICD-10-CM | POA: Diagnosis not present

## 2020-05-24 DIAGNOSIS — I10 Essential (primary) hypertension: Secondary | ICD-10-CM | POA: Diagnosis not present

## 2020-05-26 DIAGNOSIS — I69351 Hemiplegia and hemiparesis following cerebral infarction affecting right dominant side: Secondary | ICD-10-CM | POA: Diagnosis not present

## 2020-05-26 DIAGNOSIS — G2581 Restless legs syndrome: Secondary | ICD-10-CM | POA: Diagnosis not present

## 2020-05-26 DIAGNOSIS — I69320 Aphasia following cerebral infarction: Secondary | ICD-10-CM | POA: Diagnosis not present

## 2020-05-26 DIAGNOSIS — I10 Essential (primary) hypertension: Secondary | ICD-10-CM | POA: Diagnosis not present

## 2020-05-26 DIAGNOSIS — F0150 Vascular dementia without behavioral disturbance: Secondary | ICD-10-CM | POA: Diagnosis not present

## 2020-05-26 DIAGNOSIS — R2681 Unsteadiness on feet: Secondary | ICD-10-CM | POA: Diagnosis not present

## 2020-05-27 DIAGNOSIS — I69320 Aphasia following cerebral infarction: Secondary | ICD-10-CM | POA: Diagnosis not present

## 2020-05-27 DIAGNOSIS — R2681 Unsteadiness on feet: Secondary | ICD-10-CM | POA: Diagnosis not present

## 2020-05-27 DIAGNOSIS — I69351 Hemiplegia and hemiparesis following cerebral infarction affecting right dominant side: Secondary | ICD-10-CM | POA: Diagnosis not present

## 2020-05-27 DIAGNOSIS — G2581 Restless legs syndrome: Secondary | ICD-10-CM | POA: Diagnosis not present

## 2020-05-27 DIAGNOSIS — F0150 Vascular dementia without behavioral disturbance: Secondary | ICD-10-CM | POA: Diagnosis not present

## 2020-05-27 DIAGNOSIS — I10 Essential (primary) hypertension: Secondary | ICD-10-CM | POA: Diagnosis not present

## 2020-05-28 DIAGNOSIS — G2581 Restless legs syndrome: Secondary | ICD-10-CM | POA: Diagnosis not present

## 2020-05-28 DIAGNOSIS — I69320 Aphasia following cerebral infarction: Secondary | ICD-10-CM | POA: Diagnosis not present

## 2020-05-28 DIAGNOSIS — I69351 Hemiplegia and hemiparesis following cerebral infarction affecting right dominant side: Secondary | ICD-10-CM | POA: Diagnosis not present

## 2020-05-28 DIAGNOSIS — I10 Essential (primary) hypertension: Secondary | ICD-10-CM | POA: Diagnosis not present

## 2020-05-28 DIAGNOSIS — F0150 Vascular dementia without behavioral disturbance: Secondary | ICD-10-CM | POA: Diagnosis not present

## 2020-05-28 DIAGNOSIS — R2681 Unsteadiness on feet: Secondary | ICD-10-CM | POA: Diagnosis not present

## 2020-05-30 DIAGNOSIS — I10 Essential (primary) hypertension: Secondary | ICD-10-CM | POA: Diagnosis not present

## 2020-05-30 DIAGNOSIS — I69351 Hemiplegia and hemiparesis following cerebral infarction affecting right dominant side: Secondary | ICD-10-CM | POA: Diagnosis not present

## 2020-05-30 DIAGNOSIS — G2581 Restless legs syndrome: Secondary | ICD-10-CM | POA: Diagnosis not present

## 2020-05-30 DIAGNOSIS — R2681 Unsteadiness on feet: Secondary | ICD-10-CM | POA: Diagnosis not present

## 2020-05-30 DIAGNOSIS — I69320 Aphasia following cerebral infarction: Secondary | ICD-10-CM | POA: Diagnosis not present

## 2020-05-30 DIAGNOSIS — F0150 Vascular dementia without behavioral disturbance: Secondary | ICD-10-CM | POA: Diagnosis not present

## 2020-05-31 DIAGNOSIS — I69320 Aphasia following cerebral infarction: Secondary | ICD-10-CM | POA: Diagnosis not present

## 2020-05-31 DIAGNOSIS — I10 Essential (primary) hypertension: Secondary | ICD-10-CM | POA: Diagnosis not present

## 2020-05-31 DIAGNOSIS — I69351 Hemiplegia and hemiparesis following cerebral infarction affecting right dominant side: Secondary | ICD-10-CM | POA: Diagnosis not present

## 2020-05-31 DIAGNOSIS — G2581 Restless legs syndrome: Secondary | ICD-10-CM | POA: Diagnosis not present

## 2020-05-31 DIAGNOSIS — F0150 Vascular dementia without behavioral disturbance: Secondary | ICD-10-CM | POA: Diagnosis not present

## 2020-05-31 DIAGNOSIS — R2681 Unsteadiness on feet: Secondary | ICD-10-CM | POA: Diagnosis not present

## 2020-06-01 DIAGNOSIS — R2681 Unsteadiness on feet: Secondary | ICD-10-CM | POA: Diagnosis not present

## 2020-06-01 DIAGNOSIS — I69320 Aphasia following cerebral infarction: Secondary | ICD-10-CM | POA: Diagnosis not present

## 2020-06-01 DIAGNOSIS — F0150 Vascular dementia without behavioral disturbance: Secondary | ICD-10-CM | POA: Diagnosis not present

## 2020-06-01 DIAGNOSIS — I10 Essential (primary) hypertension: Secondary | ICD-10-CM | POA: Diagnosis not present

## 2020-06-01 DIAGNOSIS — G2581 Restless legs syndrome: Secondary | ICD-10-CM | POA: Diagnosis not present

## 2020-06-01 DIAGNOSIS — I69351 Hemiplegia and hemiparesis following cerebral infarction affecting right dominant side: Secondary | ICD-10-CM | POA: Diagnosis not present

## 2020-06-02 DIAGNOSIS — I69351 Hemiplegia and hemiparesis following cerebral infarction affecting right dominant side: Secondary | ICD-10-CM | POA: Diagnosis not present

## 2020-06-02 DIAGNOSIS — I10 Essential (primary) hypertension: Secondary | ICD-10-CM | POA: Diagnosis not present

## 2020-06-02 DIAGNOSIS — F0150 Vascular dementia without behavioral disturbance: Secondary | ICD-10-CM | POA: Diagnosis not present

## 2020-06-02 DIAGNOSIS — G2581 Restless legs syndrome: Secondary | ICD-10-CM | POA: Diagnosis not present

## 2020-06-02 DIAGNOSIS — R2681 Unsteadiness on feet: Secondary | ICD-10-CM | POA: Diagnosis not present

## 2020-06-02 DIAGNOSIS — I69320 Aphasia following cerebral infarction: Secondary | ICD-10-CM | POA: Diagnosis not present

## 2020-06-04 DIAGNOSIS — I69320 Aphasia following cerebral infarction: Secondary | ICD-10-CM | POA: Diagnosis not present

## 2020-06-04 DIAGNOSIS — I69351 Hemiplegia and hemiparesis following cerebral infarction affecting right dominant side: Secondary | ICD-10-CM | POA: Diagnosis not present

## 2020-06-04 DIAGNOSIS — G2581 Restless legs syndrome: Secondary | ICD-10-CM | POA: Diagnosis not present

## 2020-06-04 DIAGNOSIS — R2681 Unsteadiness on feet: Secondary | ICD-10-CM | POA: Diagnosis not present

## 2020-06-04 DIAGNOSIS — I10 Essential (primary) hypertension: Secondary | ICD-10-CM | POA: Diagnosis not present

## 2020-06-04 DIAGNOSIS — F0150 Vascular dementia without behavioral disturbance: Secondary | ICD-10-CM | POA: Diagnosis not present

## 2020-06-05 DIAGNOSIS — F0150 Vascular dementia without behavioral disturbance: Secondary | ICD-10-CM | POA: Diagnosis not present

## 2020-06-05 DIAGNOSIS — I69320 Aphasia following cerebral infarction: Secondary | ICD-10-CM | POA: Diagnosis not present

## 2020-06-05 DIAGNOSIS — I69351 Hemiplegia and hemiparesis following cerebral infarction affecting right dominant side: Secondary | ICD-10-CM | POA: Diagnosis not present

## 2020-06-05 DIAGNOSIS — I10 Essential (primary) hypertension: Secondary | ICD-10-CM | POA: Diagnosis not present

## 2020-06-05 DIAGNOSIS — G2581 Restless legs syndrome: Secondary | ICD-10-CM | POA: Diagnosis not present

## 2020-06-05 DIAGNOSIS — R2681 Unsteadiness on feet: Secondary | ICD-10-CM | POA: Diagnosis not present

## 2020-06-07 DIAGNOSIS — R2681 Unsteadiness on feet: Secondary | ICD-10-CM | POA: Diagnosis not present

## 2020-06-07 DIAGNOSIS — F0150 Vascular dementia without behavioral disturbance: Secondary | ICD-10-CM | POA: Diagnosis not present

## 2020-06-07 DIAGNOSIS — I69351 Hemiplegia and hemiparesis following cerebral infarction affecting right dominant side: Secondary | ICD-10-CM | POA: Diagnosis not present

## 2020-06-07 DIAGNOSIS — G2581 Restless legs syndrome: Secondary | ICD-10-CM | POA: Diagnosis not present

## 2020-06-07 DIAGNOSIS — I10 Essential (primary) hypertension: Secondary | ICD-10-CM | POA: Diagnosis not present

## 2020-06-07 DIAGNOSIS — I69320 Aphasia following cerebral infarction: Secondary | ICD-10-CM | POA: Diagnosis not present

## 2020-06-08 DIAGNOSIS — I69351 Hemiplegia and hemiparesis following cerebral infarction affecting right dominant side: Secondary | ICD-10-CM | POA: Diagnosis not present

## 2020-06-08 DIAGNOSIS — I10 Essential (primary) hypertension: Secondary | ICD-10-CM | POA: Diagnosis not present

## 2020-06-08 DIAGNOSIS — G2581 Restless legs syndrome: Secondary | ICD-10-CM | POA: Diagnosis not present

## 2020-06-08 DIAGNOSIS — F0150 Vascular dementia without behavioral disturbance: Secondary | ICD-10-CM | POA: Diagnosis not present

## 2020-06-08 DIAGNOSIS — I69320 Aphasia following cerebral infarction: Secondary | ICD-10-CM | POA: Diagnosis not present

## 2020-06-08 DIAGNOSIS — R2681 Unsteadiness on feet: Secondary | ICD-10-CM | POA: Diagnosis not present

## 2020-06-10 DIAGNOSIS — I69320 Aphasia following cerebral infarction: Secondary | ICD-10-CM | POA: Diagnosis not present

## 2020-06-10 DIAGNOSIS — I69351 Hemiplegia and hemiparesis following cerebral infarction affecting right dominant side: Secondary | ICD-10-CM | POA: Diagnosis not present

## 2020-06-10 DIAGNOSIS — F0150 Vascular dementia without behavioral disturbance: Secondary | ICD-10-CM | POA: Diagnosis not present

## 2020-06-10 DIAGNOSIS — I10 Essential (primary) hypertension: Secondary | ICD-10-CM | POA: Diagnosis not present

## 2020-06-10 DIAGNOSIS — R2681 Unsteadiness on feet: Secondary | ICD-10-CM | POA: Diagnosis not present

## 2020-06-10 DIAGNOSIS — G2581 Restless legs syndrome: Secondary | ICD-10-CM | POA: Diagnosis not present

## 2020-06-11 DIAGNOSIS — R2681 Unsteadiness on feet: Secondary | ICD-10-CM | POA: Diagnosis not present

## 2020-06-11 DIAGNOSIS — I69351 Hemiplegia and hemiparesis following cerebral infarction affecting right dominant side: Secondary | ICD-10-CM | POA: Diagnosis not present

## 2020-06-11 DIAGNOSIS — F0150 Vascular dementia without behavioral disturbance: Secondary | ICD-10-CM | POA: Diagnosis not present

## 2020-06-11 DIAGNOSIS — I10 Essential (primary) hypertension: Secondary | ICD-10-CM | POA: Diagnosis not present

## 2020-06-11 DIAGNOSIS — G2581 Restless legs syndrome: Secondary | ICD-10-CM | POA: Diagnosis not present

## 2020-06-11 DIAGNOSIS — I69320 Aphasia following cerebral infarction: Secondary | ICD-10-CM | POA: Diagnosis not present

## 2020-06-12 DIAGNOSIS — R2681 Unsteadiness on feet: Secondary | ICD-10-CM | POA: Diagnosis not present

## 2020-06-12 DIAGNOSIS — I10 Essential (primary) hypertension: Secondary | ICD-10-CM | POA: Diagnosis not present

## 2020-06-12 DIAGNOSIS — I69351 Hemiplegia and hemiparesis following cerebral infarction affecting right dominant side: Secondary | ICD-10-CM | POA: Diagnosis not present

## 2020-06-12 DIAGNOSIS — G2581 Restless legs syndrome: Secondary | ICD-10-CM | POA: Diagnosis not present

## 2020-06-12 DIAGNOSIS — F0150 Vascular dementia without behavioral disturbance: Secondary | ICD-10-CM | POA: Diagnosis not present

## 2020-06-12 DIAGNOSIS — I69320 Aphasia following cerebral infarction: Secondary | ICD-10-CM | POA: Diagnosis not present

## 2020-06-13 DIAGNOSIS — G2581 Restless legs syndrome: Secondary | ICD-10-CM | POA: Diagnosis not present

## 2020-06-13 DIAGNOSIS — I10 Essential (primary) hypertension: Secondary | ICD-10-CM | POA: Diagnosis not present

## 2020-06-13 DIAGNOSIS — F0150 Vascular dementia without behavioral disturbance: Secondary | ICD-10-CM | POA: Diagnosis not present

## 2020-06-13 DIAGNOSIS — R2681 Unsteadiness on feet: Secondary | ICD-10-CM | POA: Diagnosis not present

## 2020-06-13 DIAGNOSIS — I69351 Hemiplegia and hemiparesis following cerebral infarction affecting right dominant side: Secondary | ICD-10-CM | POA: Diagnosis not present

## 2020-06-13 DIAGNOSIS — I69320 Aphasia following cerebral infarction: Secondary | ICD-10-CM | POA: Diagnosis not present

## 2020-06-15 DIAGNOSIS — I69351 Hemiplegia and hemiparesis following cerebral infarction affecting right dominant side: Secondary | ICD-10-CM | POA: Diagnosis not present

## 2020-06-15 DIAGNOSIS — I69320 Aphasia following cerebral infarction: Secondary | ICD-10-CM | POA: Diagnosis not present

## 2020-06-15 DIAGNOSIS — G2581 Restless legs syndrome: Secondary | ICD-10-CM | POA: Diagnosis not present

## 2020-06-15 DIAGNOSIS — I10 Essential (primary) hypertension: Secondary | ICD-10-CM | POA: Diagnosis not present

## 2020-06-15 DIAGNOSIS — F0150 Vascular dementia without behavioral disturbance: Secondary | ICD-10-CM | POA: Diagnosis not present

## 2020-06-15 DIAGNOSIS — R2681 Unsteadiness on feet: Secondary | ICD-10-CM | POA: Diagnosis not present

## 2020-06-17 DIAGNOSIS — F0150 Vascular dementia without behavioral disturbance: Secondary | ICD-10-CM | POA: Diagnosis not present

## 2020-06-17 DIAGNOSIS — R2681 Unsteadiness on feet: Secondary | ICD-10-CM | POA: Diagnosis not present

## 2020-06-17 DIAGNOSIS — I69320 Aphasia following cerebral infarction: Secondary | ICD-10-CM | POA: Diagnosis not present

## 2020-06-17 DIAGNOSIS — R2689 Other abnormalities of gait and mobility: Secondary | ICD-10-CM | POA: Diagnosis not present

## 2020-06-17 DIAGNOSIS — G2581 Restless legs syndrome: Secondary | ICD-10-CM | POA: Diagnosis not present

## 2020-06-17 DIAGNOSIS — I69351 Hemiplegia and hemiparesis following cerebral infarction affecting right dominant side: Secondary | ICD-10-CM | POA: Diagnosis not present

## 2020-06-17 DIAGNOSIS — W19XXXA Unspecified fall, initial encounter: Secondary | ICD-10-CM | POA: Diagnosis not present

## 2020-06-17 DIAGNOSIS — R4189 Other symptoms and signs involving cognitive functions and awareness: Secondary | ICD-10-CM | POA: Diagnosis not present

## 2020-06-17 DIAGNOSIS — I10 Essential (primary) hypertension: Secondary | ICD-10-CM | POA: Diagnosis not present

## 2020-06-17 DIAGNOSIS — Z741 Need for assistance with personal care: Secondary | ICD-10-CM | POA: Diagnosis not present

## 2020-06-18 DIAGNOSIS — F0150 Vascular dementia without behavioral disturbance: Secondary | ICD-10-CM | POA: Diagnosis not present

## 2020-06-18 DIAGNOSIS — G2581 Restless legs syndrome: Secondary | ICD-10-CM | POA: Diagnosis not present

## 2020-06-18 DIAGNOSIS — R2681 Unsteadiness on feet: Secondary | ICD-10-CM | POA: Diagnosis not present

## 2020-06-18 DIAGNOSIS — I69351 Hemiplegia and hemiparesis following cerebral infarction affecting right dominant side: Secondary | ICD-10-CM | POA: Diagnosis not present

## 2020-06-18 DIAGNOSIS — I69320 Aphasia following cerebral infarction: Secondary | ICD-10-CM | POA: Diagnosis not present

## 2020-06-18 DIAGNOSIS — I10 Essential (primary) hypertension: Secondary | ICD-10-CM | POA: Diagnosis not present

## 2020-06-19 DIAGNOSIS — I69320 Aphasia following cerebral infarction: Secondary | ICD-10-CM | POA: Diagnosis not present

## 2020-06-19 DIAGNOSIS — I10 Essential (primary) hypertension: Secondary | ICD-10-CM | POA: Diagnosis not present

## 2020-06-19 DIAGNOSIS — G2581 Restless legs syndrome: Secondary | ICD-10-CM | POA: Diagnosis not present

## 2020-06-19 DIAGNOSIS — F0150 Vascular dementia without behavioral disturbance: Secondary | ICD-10-CM | POA: Diagnosis not present

## 2020-06-19 DIAGNOSIS — I69351 Hemiplegia and hemiparesis following cerebral infarction affecting right dominant side: Secondary | ICD-10-CM | POA: Diagnosis not present

## 2020-06-19 DIAGNOSIS — R2681 Unsteadiness on feet: Secondary | ICD-10-CM | POA: Diagnosis not present

## 2020-06-20 DIAGNOSIS — I69351 Hemiplegia and hemiparesis following cerebral infarction affecting right dominant side: Secondary | ICD-10-CM | POA: Diagnosis not present

## 2020-06-20 DIAGNOSIS — I69320 Aphasia following cerebral infarction: Secondary | ICD-10-CM | POA: Diagnosis not present

## 2020-06-20 DIAGNOSIS — F0150 Vascular dementia without behavioral disturbance: Secondary | ICD-10-CM | POA: Diagnosis not present

## 2020-06-20 DIAGNOSIS — R2681 Unsteadiness on feet: Secondary | ICD-10-CM | POA: Diagnosis not present

## 2020-06-20 DIAGNOSIS — I10 Essential (primary) hypertension: Secondary | ICD-10-CM | POA: Diagnosis not present

## 2020-06-20 DIAGNOSIS — G2581 Restless legs syndrome: Secondary | ICD-10-CM | POA: Diagnosis not present

## 2020-06-22 DIAGNOSIS — I69351 Hemiplegia and hemiparesis following cerebral infarction affecting right dominant side: Secondary | ICD-10-CM | POA: Diagnosis not present

## 2020-06-22 DIAGNOSIS — R2681 Unsteadiness on feet: Secondary | ICD-10-CM | POA: Diagnosis not present

## 2020-06-22 DIAGNOSIS — F0150 Vascular dementia without behavioral disturbance: Secondary | ICD-10-CM | POA: Diagnosis not present

## 2020-06-22 DIAGNOSIS — I69320 Aphasia following cerebral infarction: Secondary | ICD-10-CM | POA: Diagnosis not present

## 2020-06-22 DIAGNOSIS — I10 Essential (primary) hypertension: Secondary | ICD-10-CM | POA: Diagnosis not present

## 2020-06-22 DIAGNOSIS — G2581 Restless legs syndrome: Secondary | ICD-10-CM | POA: Diagnosis not present

## 2020-06-25 DIAGNOSIS — G2581 Restless legs syndrome: Secondary | ICD-10-CM | POA: Diagnosis not present

## 2020-06-25 DIAGNOSIS — I69320 Aphasia following cerebral infarction: Secondary | ICD-10-CM | POA: Diagnosis not present

## 2020-06-25 DIAGNOSIS — I10 Essential (primary) hypertension: Secondary | ICD-10-CM | POA: Diagnosis not present

## 2020-06-25 DIAGNOSIS — F0150 Vascular dementia without behavioral disturbance: Secondary | ICD-10-CM | POA: Diagnosis not present

## 2020-06-25 DIAGNOSIS — R2681 Unsteadiness on feet: Secondary | ICD-10-CM | POA: Diagnosis not present

## 2020-06-25 DIAGNOSIS — I69351 Hemiplegia and hemiparesis following cerebral infarction affecting right dominant side: Secondary | ICD-10-CM | POA: Diagnosis not present

## 2020-06-27 DIAGNOSIS — R2681 Unsteadiness on feet: Secondary | ICD-10-CM | POA: Diagnosis not present

## 2020-06-27 DIAGNOSIS — G2581 Restless legs syndrome: Secondary | ICD-10-CM | POA: Diagnosis not present

## 2020-06-27 DIAGNOSIS — I69320 Aphasia following cerebral infarction: Secondary | ICD-10-CM | POA: Diagnosis not present

## 2020-06-27 DIAGNOSIS — I10 Essential (primary) hypertension: Secondary | ICD-10-CM | POA: Diagnosis not present

## 2020-06-27 DIAGNOSIS — I69351 Hemiplegia and hemiparesis following cerebral infarction affecting right dominant side: Secondary | ICD-10-CM | POA: Diagnosis not present

## 2020-06-27 DIAGNOSIS — F0150 Vascular dementia without behavioral disturbance: Secondary | ICD-10-CM | POA: Diagnosis not present

## 2020-07-01 DIAGNOSIS — G2581 Restless legs syndrome: Secondary | ICD-10-CM | POA: Diagnosis not present

## 2020-07-01 DIAGNOSIS — I10 Essential (primary) hypertension: Secondary | ICD-10-CM | POA: Diagnosis not present

## 2020-07-01 DIAGNOSIS — R2681 Unsteadiness on feet: Secondary | ICD-10-CM | POA: Diagnosis not present

## 2020-07-01 DIAGNOSIS — I69320 Aphasia following cerebral infarction: Secondary | ICD-10-CM | POA: Diagnosis not present

## 2020-07-01 DIAGNOSIS — F0150 Vascular dementia without behavioral disturbance: Secondary | ICD-10-CM | POA: Diagnosis not present

## 2020-07-01 DIAGNOSIS — I69351 Hemiplegia and hemiparesis following cerebral infarction affecting right dominant side: Secondary | ICD-10-CM | POA: Diagnosis not present

## 2020-07-04 DIAGNOSIS — I10 Essential (primary) hypertension: Secondary | ICD-10-CM | POA: Diagnosis not present

## 2020-07-04 DIAGNOSIS — G2581 Restless legs syndrome: Secondary | ICD-10-CM | POA: Diagnosis not present

## 2020-07-04 DIAGNOSIS — R2681 Unsteadiness on feet: Secondary | ICD-10-CM | POA: Diagnosis not present

## 2020-07-04 DIAGNOSIS — F0150 Vascular dementia without behavioral disturbance: Secondary | ICD-10-CM | POA: Diagnosis not present

## 2020-07-04 DIAGNOSIS — I69351 Hemiplegia and hemiparesis following cerebral infarction affecting right dominant side: Secondary | ICD-10-CM | POA: Diagnosis not present

## 2020-07-04 DIAGNOSIS — I69320 Aphasia following cerebral infarction: Secondary | ICD-10-CM | POA: Diagnosis not present

## 2020-07-07 DIAGNOSIS — I69351 Hemiplegia and hemiparesis following cerebral infarction affecting right dominant side: Secondary | ICD-10-CM | POA: Diagnosis not present

## 2020-07-07 DIAGNOSIS — I69320 Aphasia following cerebral infarction: Secondary | ICD-10-CM | POA: Diagnosis not present

## 2020-07-07 DIAGNOSIS — R2681 Unsteadiness on feet: Secondary | ICD-10-CM | POA: Diagnosis not present

## 2020-07-07 DIAGNOSIS — F0150 Vascular dementia without behavioral disturbance: Secondary | ICD-10-CM | POA: Diagnosis not present

## 2020-07-07 DIAGNOSIS — G2581 Restless legs syndrome: Secondary | ICD-10-CM | POA: Diagnosis not present

## 2020-07-07 DIAGNOSIS — I10 Essential (primary) hypertension: Secondary | ICD-10-CM | POA: Diagnosis not present

## 2020-07-09 DIAGNOSIS — G2581 Restless legs syndrome: Secondary | ICD-10-CM | POA: Diagnosis not present

## 2020-07-09 DIAGNOSIS — F0150 Vascular dementia without behavioral disturbance: Secondary | ICD-10-CM | POA: Diagnosis not present

## 2020-07-09 DIAGNOSIS — I10 Essential (primary) hypertension: Secondary | ICD-10-CM | POA: Diagnosis not present

## 2020-07-09 DIAGNOSIS — I69320 Aphasia following cerebral infarction: Secondary | ICD-10-CM | POA: Diagnosis not present

## 2020-07-09 DIAGNOSIS — R2681 Unsteadiness on feet: Secondary | ICD-10-CM | POA: Diagnosis not present

## 2020-07-09 DIAGNOSIS — I69351 Hemiplegia and hemiparesis following cerebral infarction affecting right dominant side: Secondary | ICD-10-CM | POA: Diagnosis not present

## 2020-07-10 DIAGNOSIS — I69351 Hemiplegia and hemiparesis following cerebral infarction affecting right dominant side: Secondary | ICD-10-CM | POA: Diagnosis not present

## 2020-07-10 DIAGNOSIS — G2581 Restless legs syndrome: Secondary | ICD-10-CM | POA: Diagnosis not present

## 2020-07-10 DIAGNOSIS — I10 Essential (primary) hypertension: Secondary | ICD-10-CM | POA: Diagnosis not present

## 2020-07-10 DIAGNOSIS — F0150 Vascular dementia without behavioral disturbance: Secondary | ICD-10-CM | POA: Diagnosis not present

## 2020-07-10 DIAGNOSIS — R2681 Unsteadiness on feet: Secondary | ICD-10-CM | POA: Diagnosis not present

## 2020-07-10 DIAGNOSIS — I69320 Aphasia following cerebral infarction: Secondary | ICD-10-CM | POA: Diagnosis not present

## 2020-07-13 DIAGNOSIS — G2581 Restless legs syndrome: Secondary | ICD-10-CM | POA: Diagnosis not present

## 2020-07-13 DIAGNOSIS — I10 Essential (primary) hypertension: Secondary | ICD-10-CM | POA: Diagnosis not present

## 2020-07-13 DIAGNOSIS — I69320 Aphasia following cerebral infarction: Secondary | ICD-10-CM | POA: Diagnosis not present

## 2020-07-13 DIAGNOSIS — R2681 Unsteadiness on feet: Secondary | ICD-10-CM | POA: Diagnosis not present

## 2020-07-13 DIAGNOSIS — F0150 Vascular dementia without behavioral disturbance: Secondary | ICD-10-CM | POA: Diagnosis not present

## 2020-07-13 DIAGNOSIS — I69351 Hemiplegia and hemiparesis following cerebral infarction affecting right dominant side: Secondary | ICD-10-CM | POA: Diagnosis not present

## 2020-07-14 DIAGNOSIS — I69320 Aphasia following cerebral infarction: Secondary | ICD-10-CM | POA: Diagnosis not present

## 2020-07-14 DIAGNOSIS — R2681 Unsteadiness on feet: Secondary | ICD-10-CM | POA: Diagnosis not present

## 2020-07-14 DIAGNOSIS — I69351 Hemiplegia and hemiparesis following cerebral infarction affecting right dominant side: Secondary | ICD-10-CM | POA: Diagnosis not present

## 2020-07-14 DIAGNOSIS — I10 Essential (primary) hypertension: Secondary | ICD-10-CM | POA: Diagnosis not present

## 2020-07-14 DIAGNOSIS — F0150 Vascular dementia without behavioral disturbance: Secondary | ICD-10-CM | POA: Diagnosis not present

## 2020-07-14 DIAGNOSIS — G2581 Restless legs syndrome: Secondary | ICD-10-CM | POA: Diagnosis not present

## 2020-07-16 DIAGNOSIS — I10 Essential (primary) hypertension: Secondary | ICD-10-CM | POA: Diagnosis not present

## 2020-07-16 DIAGNOSIS — I6930 Unspecified sequelae of cerebral infarction: Secondary | ICD-10-CM | POA: Diagnosis not present

## 2020-07-16 DIAGNOSIS — F39 Unspecified mood [affective] disorder: Secondary | ICD-10-CM | POA: Diagnosis not present

## 2020-07-16 DIAGNOSIS — F3340 Major depressive disorder, recurrent, in remission, unspecified: Secondary | ICD-10-CM | POA: Diagnosis not present

## 2020-07-16 DIAGNOSIS — N1831 Chronic kidney disease, stage 3a: Secondary | ICD-10-CM | POA: Diagnosis not present

## 2020-07-16 DIAGNOSIS — F0150 Vascular dementia without behavioral disturbance: Secondary | ICD-10-CM | POA: Diagnosis not present

## 2020-09-04 DIAGNOSIS — F0150 Vascular dementia without behavioral disturbance: Secondary | ICD-10-CM | POA: Diagnosis not present

## 2020-09-04 DIAGNOSIS — K219 Gastro-esophageal reflux disease without esophagitis: Secondary | ICD-10-CM | POA: Diagnosis not present

## 2020-09-04 DIAGNOSIS — N183 Chronic kidney disease, stage 3 unspecified: Secondary | ICD-10-CM | POA: Diagnosis not present

## 2020-09-04 DIAGNOSIS — E43 Unspecified severe protein-calorie malnutrition: Secondary | ICD-10-CM | POA: Diagnosis not present

## 2020-09-04 DIAGNOSIS — D75838 Other thrombocytosis: Secondary | ICD-10-CM | POA: Diagnosis not present

## 2020-09-04 DIAGNOSIS — I10 Essential (primary) hypertension: Secondary | ICD-10-CM | POA: Diagnosis not present

## 2020-09-04 DIAGNOSIS — Z23 Encounter for immunization: Secondary | ICD-10-CM | POA: Diagnosis not present

## 2020-09-04 DIAGNOSIS — I69359 Hemiplegia and hemiparesis following cerebral infarction affecting unspecified side: Secondary | ICD-10-CM | POA: Diagnosis not present

## 2020-09-04 DIAGNOSIS — E039 Hypothyroidism, unspecified: Secondary | ICD-10-CM | POA: Diagnosis not present

## 2020-09-04 DIAGNOSIS — M1990 Unspecified osteoarthritis, unspecified site: Secondary | ICD-10-CM | POA: Diagnosis not present

## 2020-09-04 DIAGNOSIS — F39 Unspecified mood [affective] disorder: Secondary | ICD-10-CM | POA: Diagnosis not present

## 2020-11-04 DIAGNOSIS — E039 Hypothyroidism, unspecified: Secondary | ICD-10-CM | POA: Diagnosis not present

## 2020-11-04 DIAGNOSIS — D696 Thrombocytopenia, unspecified: Secondary | ICD-10-CM | POA: Diagnosis not present

## 2020-11-04 DIAGNOSIS — M1990 Unspecified osteoarthritis, unspecified site: Secondary | ICD-10-CM | POA: Diagnosis not present

## 2020-11-04 DIAGNOSIS — N183 Chronic kidney disease, stage 3 unspecified: Secondary | ICD-10-CM | POA: Diagnosis not present

## 2020-11-04 DIAGNOSIS — I10 Essential (primary) hypertension: Secondary | ICD-10-CM | POA: Diagnosis not present

## 2020-11-04 DIAGNOSIS — E43 Unspecified severe protein-calorie malnutrition: Secondary | ICD-10-CM | POA: Diagnosis not present

## 2020-11-04 DIAGNOSIS — F0150 Vascular dementia without behavioral disturbance: Secondary | ICD-10-CM | POA: Diagnosis not present

## 2020-11-04 DIAGNOSIS — F39 Unspecified mood [affective] disorder: Secondary | ICD-10-CM | POA: Diagnosis not present

## 2020-11-04 DIAGNOSIS — I69359 Hemiplegia and hemiparesis following cerebral infarction affecting unspecified side: Secondary | ICD-10-CM | POA: Diagnosis not present

## 2021-01-07 DIAGNOSIS — E441 Mild protein-calorie malnutrition: Secondary | ICD-10-CM | POA: Diagnosis not present

## 2021-01-07 DIAGNOSIS — F0150 Vascular dementia without behavioral disturbance: Secondary | ICD-10-CM | POA: Diagnosis not present

## 2021-01-07 DIAGNOSIS — F3340 Major depressive disorder, recurrent, in remission, unspecified: Secondary | ICD-10-CM | POA: Diagnosis not present

## 2021-01-07 DIAGNOSIS — I69398 Other sequelae of cerebral infarction: Secondary | ICD-10-CM | POA: Diagnosis not present

## 2021-01-07 DIAGNOSIS — M159 Polyosteoarthritis, unspecified: Secondary | ICD-10-CM | POA: Diagnosis not present

## 2021-01-08 DIAGNOSIS — Z23 Encounter for immunization: Secondary | ICD-10-CM | POA: Diagnosis not present

## 2021-02-18 DIAGNOSIS — E039 Hypothyroidism, unspecified: Secondary | ICD-10-CM | POA: Diagnosis not present

## 2021-02-18 DIAGNOSIS — E785 Hyperlipidemia, unspecified: Secondary | ICD-10-CM | POA: Diagnosis not present

## 2021-02-19 DIAGNOSIS — F331 Major depressive disorder, recurrent, moderate: Secondary | ICD-10-CM | POA: Diagnosis not present

## 2021-02-19 DIAGNOSIS — M1990 Unspecified osteoarthritis, unspecified site: Secondary | ICD-10-CM | POA: Diagnosis not present

## 2021-02-19 DIAGNOSIS — N183 Chronic kidney disease, stage 3 unspecified: Secondary | ICD-10-CM | POA: Diagnosis not present

## 2021-02-19 DIAGNOSIS — E039 Hypothyroidism, unspecified: Secondary | ICD-10-CM | POA: Diagnosis not present

## 2021-02-19 DIAGNOSIS — F39 Unspecified mood [affective] disorder: Secondary | ICD-10-CM | POA: Diagnosis not present

## 2021-02-19 DIAGNOSIS — I10 Essential (primary) hypertension: Secondary | ICD-10-CM | POA: Diagnosis not present

## 2021-02-19 DIAGNOSIS — D696 Thrombocytopenia, unspecified: Secondary | ICD-10-CM | POA: Diagnosis not present

## 2021-02-19 DIAGNOSIS — I69359 Hemiplegia and hemiparesis following cerebral infarction affecting unspecified side: Secondary | ICD-10-CM | POA: Diagnosis not present

## 2021-02-19 DIAGNOSIS — E43 Unspecified severe protein-calorie malnutrition: Secondary | ICD-10-CM | POA: Diagnosis not present

## 2021-02-19 DIAGNOSIS — F0150 Vascular dementia without behavioral disturbance: Secondary | ICD-10-CM | POA: Diagnosis not present

## 2021-02-19 DIAGNOSIS — K219 Gastro-esophageal reflux disease without esophagitis: Secondary | ICD-10-CM | POA: Diagnosis not present

## 2021-05-13 DIAGNOSIS — F0150 Vascular dementia without behavioral disturbance: Secondary | ICD-10-CM | POA: Diagnosis not present

## 2021-05-13 DIAGNOSIS — I639 Cerebral infarction, unspecified: Secondary | ICD-10-CM | POA: Diagnosis not present

## 2021-05-13 DIAGNOSIS — F3340 Major depressive disorder, recurrent, in remission, unspecified: Secondary | ICD-10-CM | POA: Diagnosis not present

## 2021-05-13 DIAGNOSIS — K219 Gastro-esophageal reflux disease without esophagitis: Secondary | ICD-10-CM | POA: Diagnosis not present

## 2021-05-13 DIAGNOSIS — N1831 Chronic kidney disease, stage 3a: Secondary | ICD-10-CM | POA: Diagnosis not present

## 2021-05-13 DIAGNOSIS — I10 Essential (primary) hypertension: Secondary | ICD-10-CM | POA: Diagnosis not present

## 2021-07-09 DIAGNOSIS — K219 Gastro-esophageal reflux disease without esophagitis: Secondary | ICD-10-CM | POA: Diagnosis not present

## 2021-07-09 DIAGNOSIS — I10 Essential (primary) hypertension: Secondary | ICD-10-CM | POA: Diagnosis not present

## 2021-07-09 DIAGNOSIS — N183 Chronic kidney disease, stage 3 unspecified: Secondary | ICD-10-CM | POA: Diagnosis not present

## 2021-07-09 DIAGNOSIS — F0150 Vascular dementia without behavioral disturbance: Secondary | ICD-10-CM | POA: Diagnosis not present

## 2021-07-09 DIAGNOSIS — F331 Major depressive disorder, recurrent, moderate: Secondary | ICD-10-CM | POA: Diagnosis not present

## 2021-07-09 DIAGNOSIS — I69398 Other sequelae of cerebral infarction: Secondary | ICD-10-CM | POA: Diagnosis not present

## 2021-09-09 DIAGNOSIS — N1831 Chronic kidney disease, stage 3a: Secondary | ICD-10-CM

## 2021-09-09 DIAGNOSIS — M159 Polyosteoarthritis, unspecified: Secondary | ICD-10-CM

## 2021-09-09 DIAGNOSIS — I10 Essential (primary) hypertension: Secondary | ICD-10-CM

## 2021-09-09 DIAGNOSIS — F0150 Vascular dementia without behavioral disturbance: Secondary | ICD-10-CM

## 2021-09-09 DIAGNOSIS — K219 Gastro-esophageal reflux disease without esophagitis: Secondary | ICD-10-CM

## 2021-09-09 DIAGNOSIS — I69920 Aphasia following unspecified cerebrovascular disease: Secondary | ICD-10-CM

## 2021-09-09 DIAGNOSIS — F3341 Major depressive disorder, recurrent, in partial remission: Secondary | ICD-10-CM

## 2021-11-05 DIAGNOSIS — E039 Hypothyroidism, unspecified: Secondary | ICD-10-CM | POA: Diagnosis not present

## 2021-11-05 DIAGNOSIS — D696 Thrombocytopenia, unspecified: Secondary | ICD-10-CM | POA: Diagnosis not present

## 2021-11-05 DIAGNOSIS — F39 Unspecified mood [affective] disorder: Secondary | ICD-10-CM | POA: Diagnosis not present

## 2021-11-05 DIAGNOSIS — M1990 Unspecified osteoarthritis, unspecified site: Secondary | ICD-10-CM | POA: Diagnosis not present

## 2021-11-05 DIAGNOSIS — I10 Essential (primary) hypertension: Secondary | ICD-10-CM | POA: Diagnosis not present

## 2021-11-05 DIAGNOSIS — K219 Gastro-esophageal reflux disease without esophagitis: Secondary | ICD-10-CM | POA: Diagnosis not present

## 2021-11-05 DIAGNOSIS — N183 Chronic kidney disease, stage 3 unspecified: Secondary | ICD-10-CM | POA: Diagnosis not present

## 2021-11-05 DIAGNOSIS — F0150 Vascular dementia without behavioral disturbance: Secondary | ICD-10-CM | POA: Diagnosis not present

## 2021-11-05 DIAGNOSIS — E43 Unspecified severe protein-calorie malnutrition: Secondary | ICD-10-CM | POA: Diagnosis not present

## 2022-02-05 DIAGNOSIS — I69351 Hemiplegia and hemiparesis following cerebral infarction affecting right dominant side: Secondary | ICD-10-CM | POA: Diagnosis not present

## 2022-02-05 DIAGNOSIS — M6281 Muscle weakness (generalized): Secondary | ICD-10-CM | POA: Diagnosis not present

## 2022-02-05 DIAGNOSIS — I10 Essential (primary) hypertension: Secondary | ICD-10-CM | POA: Diagnosis not present

## 2022-02-05 DIAGNOSIS — R2681 Unsteadiness on feet: Secondary | ICD-10-CM | POA: Diagnosis not present

## 2022-02-05 DIAGNOSIS — N183 Chronic kidney disease, stage 3 unspecified: Secondary | ICD-10-CM | POA: Diagnosis not present

## 2022-02-08 DIAGNOSIS — I10 Essential (primary) hypertension: Secondary | ICD-10-CM | POA: Diagnosis not present

## 2022-02-08 DIAGNOSIS — R2681 Unsteadiness on feet: Secondary | ICD-10-CM | POA: Diagnosis not present

## 2022-02-08 DIAGNOSIS — N183 Chronic kidney disease, stage 3 unspecified: Secondary | ICD-10-CM | POA: Diagnosis not present

## 2022-02-08 DIAGNOSIS — M6281 Muscle weakness (generalized): Secondary | ICD-10-CM | POA: Diagnosis not present

## 2022-02-08 DIAGNOSIS — I69351 Hemiplegia and hemiparesis following cerebral infarction affecting right dominant side: Secondary | ICD-10-CM | POA: Diagnosis not present

## 2022-02-09 DIAGNOSIS — M6281 Muscle weakness (generalized): Secondary | ICD-10-CM | POA: Diagnosis not present

## 2022-02-09 DIAGNOSIS — N183 Chronic kidney disease, stage 3 unspecified: Secondary | ICD-10-CM | POA: Diagnosis not present

## 2022-02-09 DIAGNOSIS — R2681 Unsteadiness on feet: Secondary | ICD-10-CM | POA: Diagnosis not present

## 2022-02-09 DIAGNOSIS — I69351 Hemiplegia and hemiparesis following cerebral infarction affecting right dominant side: Secondary | ICD-10-CM | POA: Diagnosis not present

## 2022-02-09 DIAGNOSIS — I10 Essential (primary) hypertension: Secondary | ICD-10-CM | POA: Diagnosis not present

## 2022-02-10 DIAGNOSIS — I69351 Hemiplegia and hemiparesis following cerebral infarction affecting right dominant side: Secondary | ICD-10-CM | POA: Diagnosis not present

## 2022-02-10 DIAGNOSIS — I10 Essential (primary) hypertension: Secondary | ICD-10-CM | POA: Diagnosis not present

## 2022-02-10 DIAGNOSIS — M6281 Muscle weakness (generalized): Secondary | ICD-10-CM | POA: Diagnosis not present

## 2022-02-10 DIAGNOSIS — N183 Chronic kidney disease, stage 3 unspecified: Secondary | ICD-10-CM | POA: Diagnosis not present

## 2022-02-10 DIAGNOSIS — R2681 Unsteadiness on feet: Secondary | ICD-10-CM | POA: Diagnosis not present

## 2022-02-11 DIAGNOSIS — M6281 Muscle weakness (generalized): Secondary | ICD-10-CM | POA: Diagnosis not present

## 2022-02-11 DIAGNOSIS — N183 Chronic kidney disease, stage 3 unspecified: Secondary | ICD-10-CM | POA: Diagnosis not present

## 2022-02-11 DIAGNOSIS — R2681 Unsteadiness on feet: Secondary | ICD-10-CM | POA: Diagnosis not present

## 2022-02-11 DIAGNOSIS — I69351 Hemiplegia and hemiparesis following cerebral infarction affecting right dominant side: Secondary | ICD-10-CM | POA: Diagnosis not present

## 2022-02-11 DIAGNOSIS — I10 Essential (primary) hypertension: Secondary | ICD-10-CM | POA: Diagnosis not present

## 2022-02-12 DIAGNOSIS — M6281 Muscle weakness (generalized): Secondary | ICD-10-CM | POA: Diagnosis not present

## 2022-02-12 DIAGNOSIS — I69351 Hemiplegia and hemiparesis following cerebral infarction affecting right dominant side: Secondary | ICD-10-CM | POA: Diagnosis not present

## 2022-02-12 DIAGNOSIS — R2681 Unsteadiness on feet: Secondary | ICD-10-CM | POA: Diagnosis not present

## 2022-02-12 DIAGNOSIS — N183 Chronic kidney disease, stage 3 unspecified: Secondary | ICD-10-CM | POA: Diagnosis not present

## 2022-02-12 DIAGNOSIS — I10 Essential (primary) hypertension: Secondary | ICD-10-CM | POA: Diagnosis not present

## 2022-02-15 DIAGNOSIS — R2681 Unsteadiness on feet: Secondary | ICD-10-CM | POA: Diagnosis not present

## 2022-02-15 DIAGNOSIS — M6281 Muscle weakness (generalized): Secondary | ICD-10-CM | POA: Diagnosis not present

## 2022-02-15 DIAGNOSIS — I69351 Hemiplegia and hemiparesis following cerebral infarction affecting right dominant side: Secondary | ICD-10-CM | POA: Diagnosis not present

## 2022-02-15 DIAGNOSIS — N183 Chronic kidney disease, stage 3 unspecified: Secondary | ICD-10-CM | POA: Diagnosis not present

## 2022-02-15 DIAGNOSIS — I10 Essential (primary) hypertension: Secondary | ICD-10-CM | POA: Diagnosis not present

## 2022-02-16 DIAGNOSIS — I10 Essential (primary) hypertension: Secondary | ICD-10-CM | POA: Diagnosis not present

## 2022-02-16 DIAGNOSIS — M6281 Muscle weakness (generalized): Secondary | ICD-10-CM | POA: Diagnosis not present

## 2022-02-16 DIAGNOSIS — R2681 Unsteadiness on feet: Secondary | ICD-10-CM | POA: Diagnosis not present

## 2022-02-16 DIAGNOSIS — I69351 Hemiplegia and hemiparesis following cerebral infarction affecting right dominant side: Secondary | ICD-10-CM | POA: Diagnosis not present

## 2022-02-16 DIAGNOSIS — N183 Chronic kidney disease, stage 3 unspecified: Secondary | ICD-10-CM | POA: Diagnosis not present

## 2022-02-17 DIAGNOSIS — D539 Nutritional anemia, unspecified: Secondary | ICD-10-CM | POA: Diagnosis not present

## 2022-02-17 DIAGNOSIS — I69351 Hemiplegia and hemiparesis following cerebral infarction affecting right dominant side: Secondary | ICD-10-CM | POA: Diagnosis not present

## 2022-02-17 DIAGNOSIS — N183 Chronic kidney disease, stage 3 unspecified: Secondary | ICD-10-CM | POA: Diagnosis not present

## 2022-02-17 DIAGNOSIS — E039 Hypothyroidism, unspecified: Secondary | ICD-10-CM | POA: Diagnosis not present

## 2022-02-17 DIAGNOSIS — I10 Essential (primary) hypertension: Secondary | ICD-10-CM | POA: Diagnosis not present

## 2022-02-17 DIAGNOSIS — R2681 Unsteadiness on feet: Secondary | ICD-10-CM | POA: Diagnosis not present

## 2022-02-17 DIAGNOSIS — E785 Hyperlipidemia, unspecified: Secondary | ICD-10-CM | POA: Diagnosis not present

## 2022-02-17 DIAGNOSIS — M6281 Muscle weakness (generalized): Secondary | ICD-10-CM | POA: Diagnosis not present

## 2022-02-17 LAB — BASIC METABOLIC PANEL
BUN: 16 (ref 4–21)
CO2: 26 — AB (ref 13–22)
Chloride: 105 (ref 99–108)
Creatinine: 1.1 (ref 0.5–1.1)
Glucose: 85
Potassium: 4.3 mEq/L (ref 3.5–5.1)
Sodium: 138 (ref 137–147)

## 2022-02-17 LAB — LIPID PANEL
Cholesterol: 138 (ref 0–200)
HDL: 41 (ref 35–70)
LDL Cholesterol: 74
Triglycerides: 156 (ref 40–160)

## 2022-02-17 LAB — HEPATIC FUNCTION PANEL
ALT: 10 U/L (ref 7–35)
AST: 14 (ref 13–35)
Alkaline Phosphatase: 51 (ref 25–125)
Bilirubin, Total: 0.6

## 2022-02-17 LAB — CBC AND DIFFERENTIAL
Hemoglobin: 12.5 (ref 12.0–16.0)
Neutrophils Absolute: 3111
Platelets: 145 10*3/uL — AB (ref 150–400)
WBC: 5.1

## 2022-02-17 LAB — COMPREHENSIVE METABOLIC PANEL
Albumin: 3.6 (ref 3.5–5.0)
Calcium: 9 (ref 8.7–10.7)
Globulin: 2.7
eGFR: 47

## 2022-02-17 LAB — CBC: RBC: 4.34 (ref 3.87–5.11)

## 2022-02-18 DIAGNOSIS — M6281 Muscle weakness (generalized): Secondary | ICD-10-CM | POA: Diagnosis not present

## 2022-02-18 DIAGNOSIS — I69351 Hemiplegia and hemiparesis following cerebral infarction affecting right dominant side: Secondary | ICD-10-CM | POA: Diagnosis not present

## 2022-02-18 DIAGNOSIS — R2681 Unsteadiness on feet: Secondary | ICD-10-CM | POA: Diagnosis not present

## 2022-02-18 DIAGNOSIS — I10 Essential (primary) hypertension: Secondary | ICD-10-CM | POA: Diagnosis not present

## 2022-02-18 DIAGNOSIS — N183 Chronic kidney disease, stage 3 unspecified: Secondary | ICD-10-CM | POA: Diagnosis not present

## 2022-02-19 DIAGNOSIS — I10 Essential (primary) hypertension: Secondary | ICD-10-CM | POA: Diagnosis not present

## 2022-02-19 DIAGNOSIS — R2681 Unsteadiness on feet: Secondary | ICD-10-CM | POA: Diagnosis not present

## 2022-02-19 DIAGNOSIS — N183 Chronic kidney disease, stage 3 unspecified: Secondary | ICD-10-CM | POA: Diagnosis not present

## 2022-02-19 DIAGNOSIS — M6281 Muscle weakness (generalized): Secondary | ICD-10-CM | POA: Diagnosis not present

## 2022-02-19 DIAGNOSIS — I69351 Hemiplegia and hemiparesis following cerebral infarction affecting right dominant side: Secondary | ICD-10-CM | POA: Diagnosis not present

## 2022-02-22 DIAGNOSIS — R2681 Unsteadiness on feet: Secondary | ICD-10-CM | POA: Diagnosis not present

## 2022-02-22 DIAGNOSIS — I69351 Hemiplegia and hemiparesis following cerebral infarction affecting right dominant side: Secondary | ICD-10-CM | POA: Diagnosis not present

## 2022-02-22 DIAGNOSIS — M6281 Muscle weakness (generalized): Secondary | ICD-10-CM | POA: Diagnosis not present

## 2022-02-22 DIAGNOSIS — N183 Chronic kidney disease, stage 3 unspecified: Secondary | ICD-10-CM | POA: Diagnosis not present

## 2022-02-22 DIAGNOSIS — I10 Essential (primary) hypertension: Secondary | ICD-10-CM | POA: Diagnosis not present

## 2022-02-23 DIAGNOSIS — M6281 Muscle weakness (generalized): Secondary | ICD-10-CM | POA: Diagnosis not present

## 2022-02-23 DIAGNOSIS — I10 Essential (primary) hypertension: Secondary | ICD-10-CM | POA: Diagnosis not present

## 2022-02-23 DIAGNOSIS — R2681 Unsteadiness on feet: Secondary | ICD-10-CM | POA: Diagnosis not present

## 2022-02-23 DIAGNOSIS — I69351 Hemiplegia and hemiparesis following cerebral infarction affecting right dominant side: Secondary | ICD-10-CM | POA: Diagnosis not present

## 2022-02-23 DIAGNOSIS — N183 Chronic kidney disease, stage 3 unspecified: Secondary | ICD-10-CM | POA: Diagnosis not present

## 2022-02-24 DIAGNOSIS — N183 Chronic kidney disease, stage 3 unspecified: Secondary | ICD-10-CM | POA: Diagnosis not present

## 2022-02-24 DIAGNOSIS — M6281 Muscle weakness (generalized): Secondary | ICD-10-CM | POA: Diagnosis not present

## 2022-02-24 DIAGNOSIS — I10 Essential (primary) hypertension: Secondary | ICD-10-CM | POA: Diagnosis not present

## 2022-02-24 DIAGNOSIS — R2681 Unsteadiness on feet: Secondary | ICD-10-CM | POA: Diagnosis not present

## 2022-02-24 DIAGNOSIS — I69351 Hemiplegia and hemiparesis following cerebral infarction affecting right dominant side: Secondary | ICD-10-CM | POA: Diagnosis not present

## 2022-02-25 DIAGNOSIS — Z23 Encounter for immunization: Secondary | ICD-10-CM | POA: Diagnosis not present

## 2022-02-25 DIAGNOSIS — I69351 Hemiplegia and hemiparesis following cerebral infarction affecting right dominant side: Secondary | ICD-10-CM | POA: Diagnosis not present

## 2022-02-25 DIAGNOSIS — R2681 Unsteadiness on feet: Secondary | ICD-10-CM | POA: Diagnosis not present

## 2022-02-25 DIAGNOSIS — M6281 Muscle weakness (generalized): Secondary | ICD-10-CM | POA: Diagnosis not present

## 2022-02-25 DIAGNOSIS — I10 Essential (primary) hypertension: Secondary | ICD-10-CM | POA: Diagnosis not present

## 2022-02-25 DIAGNOSIS — N183 Chronic kidney disease, stage 3 unspecified: Secondary | ICD-10-CM | POA: Diagnosis not present

## 2022-02-26 DIAGNOSIS — I10 Essential (primary) hypertension: Secondary | ICD-10-CM | POA: Diagnosis not present

## 2022-02-26 DIAGNOSIS — N183 Chronic kidney disease, stage 3 unspecified: Secondary | ICD-10-CM | POA: Diagnosis not present

## 2022-02-26 DIAGNOSIS — I69351 Hemiplegia and hemiparesis following cerebral infarction affecting right dominant side: Secondary | ICD-10-CM | POA: Diagnosis not present

## 2022-02-26 DIAGNOSIS — M6281 Muscle weakness (generalized): Secondary | ICD-10-CM | POA: Diagnosis not present

## 2022-02-26 DIAGNOSIS — R2681 Unsteadiness on feet: Secondary | ICD-10-CM | POA: Diagnosis not present

## 2022-03-01 DIAGNOSIS — I10 Essential (primary) hypertension: Secondary | ICD-10-CM | POA: Diagnosis not present

## 2022-03-01 DIAGNOSIS — I69351 Hemiplegia and hemiparesis following cerebral infarction affecting right dominant side: Secondary | ICD-10-CM | POA: Diagnosis not present

## 2022-03-01 DIAGNOSIS — N183 Chronic kidney disease, stage 3 unspecified: Secondary | ICD-10-CM | POA: Diagnosis not present

## 2022-03-01 DIAGNOSIS — R2681 Unsteadiness on feet: Secondary | ICD-10-CM | POA: Diagnosis not present

## 2022-03-01 DIAGNOSIS — M6281 Muscle weakness (generalized): Secondary | ICD-10-CM | POA: Diagnosis not present

## 2022-03-02 DIAGNOSIS — N183 Chronic kidney disease, stage 3 unspecified: Secondary | ICD-10-CM | POA: Diagnosis not present

## 2022-03-02 DIAGNOSIS — R2681 Unsteadiness on feet: Secondary | ICD-10-CM | POA: Diagnosis not present

## 2022-03-02 DIAGNOSIS — I10 Essential (primary) hypertension: Secondary | ICD-10-CM | POA: Diagnosis not present

## 2022-03-02 DIAGNOSIS — M6281 Muscle weakness (generalized): Secondary | ICD-10-CM | POA: Diagnosis not present

## 2022-03-02 DIAGNOSIS — I69351 Hemiplegia and hemiparesis following cerebral infarction affecting right dominant side: Secondary | ICD-10-CM | POA: Diagnosis not present

## 2022-03-03 DIAGNOSIS — M6281 Muscle weakness (generalized): Secondary | ICD-10-CM | POA: Diagnosis not present

## 2022-03-03 DIAGNOSIS — I10 Essential (primary) hypertension: Secondary | ICD-10-CM | POA: Diagnosis not present

## 2022-03-03 DIAGNOSIS — R2681 Unsteadiness on feet: Secondary | ICD-10-CM | POA: Diagnosis not present

## 2022-03-03 DIAGNOSIS — I69351 Hemiplegia and hemiparesis following cerebral infarction affecting right dominant side: Secondary | ICD-10-CM | POA: Diagnosis not present

## 2022-03-03 DIAGNOSIS — N183 Chronic kidney disease, stage 3 unspecified: Secondary | ICD-10-CM | POA: Diagnosis not present

## 2022-03-04 DIAGNOSIS — R2681 Unsteadiness on feet: Secondary | ICD-10-CM | POA: Diagnosis not present

## 2022-03-04 DIAGNOSIS — N183 Chronic kidney disease, stage 3 unspecified: Secondary | ICD-10-CM | POA: Diagnosis not present

## 2022-03-04 DIAGNOSIS — M6281 Muscle weakness (generalized): Secondary | ICD-10-CM | POA: Diagnosis not present

## 2022-03-04 DIAGNOSIS — I10 Essential (primary) hypertension: Secondary | ICD-10-CM | POA: Diagnosis not present

## 2022-03-04 DIAGNOSIS — I69351 Hemiplegia and hemiparesis following cerebral infarction affecting right dominant side: Secondary | ICD-10-CM | POA: Diagnosis not present

## 2022-03-07 ENCOUNTER — Non-Acute Institutional Stay (SKILLED_NURSING_FACILITY): Payer: Medicare Other | Admitting: Student

## 2022-03-07 ENCOUNTER — Encounter: Payer: Self-pay | Admitting: Student

## 2022-03-07 DIAGNOSIS — I63532 Cerebral infarction due to unspecified occlusion or stenosis of left posterior cerebral artery: Secondary | ICD-10-CM | POA: Diagnosis not present

## 2022-03-07 DIAGNOSIS — I10 Essential (primary) hypertension: Secondary | ICD-10-CM

## 2022-03-07 DIAGNOSIS — E782 Mixed hyperlipidemia: Secondary | ICD-10-CM | POA: Diagnosis not present

## 2022-03-07 DIAGNOSIS — K219 Gastro-esophageal reflux disease without esophagitis: Secondary | ICD-10-CM | POA: Diagnosis not present

## 2022-03-07 DIAGNOSIS — E039 Hypothyroidism, unspecified: Secondary | ICD-10-CM | POA: Diagnosis not present

## 2022-03-07 NOTE — Progress Notes (Unsigned)
Location:  Other Nursing Home Room Number: [place] [address]A Place of Service:  SNF (31) Provider:  Wynn Banker, Jordan Hawks, MD  Patient Care Team: Dewayne Shorter, MD as PCP - General [facility])  Extended Emergency Contact Information Primary Emergency Contact: Tyson Dense Address: [address]          [place], [place] [address] Johnnette Litter of [place] Phone: [phone] Relation: Daughter Secondary Emergency Contact: [place] Phone: [phone] Mobile Phone: [phone] Relation: Son  Code Status:  DNR Goals of care: Advanced Directive information    09/13/2017    2:36 PM  Advanced Directives  Does Patient Have a Medical Advance Directive? Yes  Type of Advance Directive [place]  Does patient want to make changes to medical advance directive? No - Patient declined     Chief Complaint  Patient presents with   Medical Management of Chronic Issues    Medical Management of Chronic Issues. Vitals from [place] dated 11/14    HPI:  Pt is a 88 y.o. female seen today for medical management of chronic diseases.    She gives her full name and date of birth. She says it's 2008. She keeps repeating 2008. She says the Pilot "I can't think."   She is from [facility]. For work she was an [employer]. She says she cna't prescribe any   She says yes to having chilren - she had 4, 10, or 11 children. Get's frustratied that the numbers aren't coming out right.   Maudie Mercury is one of her daughters.   She knows she is watching a children's programming show, but can't say seseme street.  She denies pain.  Past Medical History:  Diagnosis Date   Cervicalgia    Depression    Essential hypertension    Hemorrhoids    History of MRSA infection    Hyperlipidemia    Hypertension    Hypothyroidism    IBS (irritable bowel syndrome)    Osteoarthritis of multiple joints    Restless leg    Senile  osteoporosis    Shingles    Sleep apnea    Ulcerative colitis ([place])    Urge incontinence    Past Surgical History:  Procedure Laterality Date   ABDOMINAL HYSTERECTOMY  1980   cataract surgery     CHOLECYSTECTOMY  1955   colon polyp removal  12/2012   ESOPHAGOGASTRODUODENOSCOPY  2014   makoplasty Left 06/2012   parotid gland removal  1984   VISCERAL ANGIOGRAPHY N/A 09/13/2017   Procedure: VISCERAL ANGIOGRAPHY;  Surgeon: Katha Cabal, MD;  Location: [place] [facility];  Service: Cardiovascular;  Laterality: N/A;    Allergies  Allergen Reactions   Codeine Sulfate Hives and Itching    Can take tramadol   Codeine Other (See Comments)    Outpatient Encounter Medications as of 03/07/2022  Medication Sig   acetaminophen (TYLENOL) 325 MG tablet Take 650 mg by mouth every 8 (eight) hours as needed for mild pain or moderate pain.   atorvastatin (LIPITOR) 40 MG tablet Take 1 tablet (40 mg total) by mouth daily at 6 PM.   Calcium Carbonate-Vitamin D (CALCIUM 500 + D) 500-125 MG-UNIT TABS Take 1 tablet by mouth daily.    Cholecalciferol (D3-50) 50000 units capsule Take 50,000 Units by mouth every 30 (thirty) days.   clopidogrel (PLAVIX) 75 MG tablet Take 75 mg by mouth daily.   cyanocobalamin 1000 MCG tablet Take 1,000 mcg by  mouth daily.   DULoxetine (CYMBALTA) 30 MG capsule Take 30 mg by mouth daily.   fluticasone (FLONASE) 50 MCG/ACT nasal spray Place 2 sprays into both nostrils daily.   levothyroxine (SYNTHROID, LEVOTHROID) 50 MCG tablet Take 50 mcg by mouth daily before breakfast.   losartan (COZAAR) 50 MG tablet Take 50 mg by mouth daily.    magnesium hydroxide (MILK OF MAGNESIA) 400 MG/5ML suspension Take 30 mLs by mouth daily as needed for mild constipation.   mirtazapine (REMERON) 7.5 MG tablet Take 7.5 mg by mouth at bedtime.   Multiple Vitamins-Minerals (MULTIVITAMIN ADULT PO) Take 1 tablet by mouth daily.    nystatin (MYCOSTATIN/NYSTOP) powder Apply 1 Application  topically 2 (two) times daily. Topically to breasts as needed for rash.   ondansetron (ZOFRAN ODT) 4 MG disintegrating tablet Take 1 tablet (4 mg total) by mouth every 8 (eight) hours as needed for nausea or vomiting.   pantoprazole (PROTONIX) 40 MG tablet Take 1 tablet (40 mg total) by mouth daily.   [DISCONTINUED] metoCLOPramide (REGLAN) 10 MG tablet Take 1 tablet (10 mg total) by mouth every 8 (eight) hours as needed. (Patient not taking: Reported on 09/13/2017)   [DISCONTINUED] metoprolol tartrate (LOPRESSOR) 25 MG tablet Take 0.5 tablets (12.5 mg total) by mouth 2 (two) times daily. (Patient not taking: Reported on 09/13/2017)   [DISCONTINUED] VESICARE 5 MG tablet TAKE 1 TABLET BY MOUTH ONCE A DAY (Patient not taking: Reported on 09/13/2017)   No facility-administered encounter medications on file as of 03/07/2022.    Review of Systems  All other systems reviewed and are negative.   Immunization History  Administered Date(s) Administered   Influenza, High Dose Seasonal PF 04/05/2017   Influenza-Unspecified 02/01/2022   MODERNA COVID-19 SARS-COV-2 PEDS BIVALENT BOOSTER 6Y-11Y 04/29/2019, 05/27/2019, 02/25/2022   Pneumococcal Conjugate-13 03/07/2018   Pneumococcal Polysaccharide-23 03/11/2019   Pertinent  Health Maintenance Due  Topic Date Due   DEXA SCAN  Never done   INFLUENZA VACCINE  Completed       No data to display         Functional Status Survey:    Vitals:   03/07/22 1643  BP: 113/73  Pulse: 83  Resp: 18  Temp: 97.8 F (36.6 C)  SpO2: 94%  Weight: 150 lb 12.8 oz (68.4 kg)   Body mass index is 26.71 kg/m. Physical Exam Cardiovascular:     Rate and Rhythm: Normal rate.     Pulses: Normal pulses.     Heart sounds: Normal heart sounds.  Pulmonary:     Effort: Pulmonary effort is normal.     Breath sounds: Normal breath sounds.  Abdominal:     General: Abdomen is flat. Bowel sounds are normal.  Skin:    General: Skin is warm and dry.  Neurological:      Mental Status: She is alert. Mental status is at baseline.     Comments: Patient with word salad for communication     Labs reviewed: Recent Labs    02/17/22 0000  NA 138  K 4.3  CL 105  CO2 26*  BUN 16  CREATININE 1.1  CALCIUM 9.0   Recent Labs    02/17/22 0000  AST 14  ALT 10  ALKPHOS 51  ALBUMIN 3.6   Recent Labs    02/17/22 0000  WBC 5.1  NEUTROABS 3,111.00  HGB 12.5  PLT 145*   Lab Results  Component Value Date   TSH 1.693 03/03/2016   Lab Results  Component Value  Date   HGBA1C 5.8 (H) 04/04/2017   Lab Results  Component Value Date   CHOL 138 02/17/2022   HDL 41 02/17/2022   LDLCALC 74 02/17/2022   TRIG 156 02/17/2022   CHOLHDL 6.3 04/04/2017    Significant Diagnostic Results in last 30 days:  No results found.  Assessment/Plan 1. Cerebrovascular accident (CVA) due to stenosis of left posterior cerebral artery (HCC) Hx of CVA leading to difficulty communicating. Continue Atorvastatin 40 mg daily, Plavix 75 mg. Weight stable, continue mirtazapine 7.5 mg daily. Mood stable with Cymbalta 30 mg daily.   2. Primary hypertension BP well-controlled on Cozaar 50 mg daily.   3. Adult hypothyroidism Thyroid well-controlled with Levothyroxine   4. Combined hyperlipidemia  5. GERD Symptoms well-controlled with protonix 40 mg daily.     Family/ staff Communication: nursing.  Labs/tests ordered:  q6 mo BMP and CBC.

## 2022-03-09 DIAGNOSIS — K219 Gastro-esophageal reflux disease without esophagitis: Secondary | ICD-10-CM | POA: Insufficient documentation

## 2022-05-05 ENCOUNTER — Non-Acute Institutional Stay (SKILLED_NURSING_FACILITY): Payer: Medicare Other | Admitting: Nurse Practitioner

## 2022-05-05 ENCOUNTER — Encounter: Payer: Self-pay | Admitting: Nurse Practitioner

## 2022-05-05 DIAGNOSIS — K219 Gastro-esophageal reflux disease without esophagitis: Secondary | ICD-10-CM

## 2022-05-05 DIAGNOSIS — M159 Polyosteoarthritis, unspecified: Secondary | ICD-10-CM

## 2022-05-05 DIAGNOSIS — I6930 Unspecified sequelae of cerebral infarction: Secondary | ICD-10-CM | POA: Diagnosis not present

## 2022-05-05 DIAGNOSIS — E782 Mixed hyperlipidemia: Secondary | ICD-10-CM

## 2022-05-05 DIAGNOSIS — K5904 Chronic idiopathic constipation: Secondary | ICD-10-CM | POA: Diagnosis not present

## 2022-05-05 DIAGNOSIS — E039 Hypothyroidism, unspecified: Secondary | ICD-10-CM

## 2022-05-05 DIAGNOSIS — I10 Essential (primary) hypertension: Secondary | ICD-10-CM

## 2022-05-05 NOTE — Progress Notes (Signed)
Location:  Other [facility]) Nursing Home Room Number: 956 A Place of Service:  SNF (31)  Kaitlyn Lupe K. Dewaine Oats, NP   Patient Care Team: Dewayne Shorter, MD as PCP - General [facility])  Extended Emergency Contact Information Primary Emergency Contact: Tyson Dense Address: [address]          [place], [place] [address] Johnnette Litter of [place] Phone: [phone] Relation: Daughter Secondary Emergency Contact: Irena [place] of [place] Phone: [phone] Mobile Phone: [phone] Relation: Son  Goals of care: Advanced Directive information    05/05/2022   11:52 AM  Advanced Directives  Does Patient Have a Medical Advance Directive? Yes  Type of Advance Directive Out of facility DNR (pink MOST or yellow form)  Does patient want to make changes to medical advance directive? No - Patient declined  Pre-existing out of facility DNR order (yellow form or pink MOST form) Yellow form placed in chart (order not valid for inpatient use)     Chief Complaint  Patient presents with   Medical Management of Chronic Issues    Routine visit. Discuss need for AWV, td/tdap, shingrix, DEXA, and additional covid boosters or post pone if patient refuses or is not a candidate. [facility] verified     HPI:  Pt is a 89 y.o. female seen today for medical management of chronic disease. Pt with hx of hx of CVA,  Depression, hypothyroid, htn, GERD.  She reports she tends to be more anxious but cymbalta is helpful.   Occasionally will have pain but tylenol helps.   Hx of CVA on plavix, has aphagia. Her speech is slowed and sometimes has a hard time communicated needs but overall able to answer questions appropriately.   Hyperlipidemia- on lipitor.   Reports she is sleeping well at night.   Weight has been stable.   Past Medical History:  Diagnosis Date   Cervicalgia    Depression    Essential hypertension    Hemorrhoids    History of MRSA  infection    Hyperlipidemia    Hypertension    Hypothyroidism    IBS (irritable bowel syndrome)    Osteoarthritis of multiple joints    Restless leg    Senile osteoporosis    Shingles    Sleep apnea    Ulcerative colitis ([place])    Urge incontinence    Past Surgical History:  Procedure Laterality Date   ABDOMINAL HYSTERECTOMY  1980   cataract surgery     CHOLECYSTECTOMY  1955   colon polyp removal  12/2012   ESOPHAGOGASTRODUODENOSCOPY  2014   makoplasty Left 06/2012   parotid gland removal  1984   VISCERAL ANGIOGRAPHY N/A 09/13/2017   Procedure: VISCERAL ANGIOGRAPHY;  Surgeon: Katha Cabal, MD;  Location: [place] [facility];  Service: Cardiovascular;  Laterality: N/A;    Allergies  Allergen Reactions   Codeine Sulfate Hives and Itching    Can take tramadol   Codeine Other (See Comments)    Outpatient Encounter Medications as of 05/05/2022  Medication Sig   acetaminophen (TYLENOL) 325 MG tablet Take 650 mg by mouth every 8 (eight) hours as needed for mild pain or moderate pain.   atorvastatin (LIPITOR) 40 MG tablet Take 1 tablet (40 mg total) by mouth daily at 6 PM.   Calcium Carbonate-Vitamin D (CALCIUM 500 + D) 500-125 MG-UNIT TABS Take 1 tablet by mouth daily.    Cholecalciferol (D3-50) 50000 units capsule Take 50,000 Units by mouth every 30 (thirty) days.  clopidogrel (PLAVIX) 75 MG tablet Take 75 mg by mouth daily.   cyanocobalamin 1000 MCG tablet Take 1,000 mcg by mouth daily.   DULoxetine (CYMBALTA) 30 MG capsule Take 30 mg by mouth daily.   fluticasone (FLONASE) 50 MCG/ACT nasal spray Place 2 sprays into both nostrils daily.   levothyroxine (SYNTHROID, LEVOTHROID) 50 MCG tablet Take 50 mcg by mouth daily before breakfast.   losartan (COZAAR) 50 MG tablet Take 50 mg by mouth daily.    magnesium hydroxide (MILK OF MAGNESIA) 400 MG/5ML suspension Take 30 mLs by mouth daily as needed for mild constipation.   mirtazapine (REMERON) 7.5 MG tablet Take 7.5 mg by  mouth at bedtime.   Multiple Vitamins-Minerals (MULTIVITAMIN ADULT PO) Take 1 tablet by mouth daily.    nystatin (MYCOSTATIN/NYSTOP) powder Apply 1 Application topically 2 (two) times daily. Topically to breasts as needed for rash.   ondansetron (ZOFRAN ODT) 4 MG disintegrating tablet Take 1 tablet (4 mg total) by mouth every 8 (eight) hours as needed for nausea or vomiting.   pantoprazole (PROTONIX) 40 MG tablet Take 1 tablet (40 mg total) by mouth daily.   No facility-administered encounter medications on file as of 05/05/2022.    Review of Systems  Constitutional:  Negative for activity change, appetite change, fatigue and unexpected weight change.  HENT:  Negative for congestion and hearing loss.   Eyes: Negative.   Respiratory:  Negative for cough and shortness of breath.   Cardiovascular:  Negative for chest pain, palpitations and leg swelling.  Gastrointestinal:  Positive for constipation. Negative for abdominal pain and diarrhea.  Genitourinary:  Negative for difficulty urinating and dysuria.  Musculoskeletal:  Negative for arthralgias and myalgias.  Skin:  Negative for color change and wound.  Neurological:  Negative for dizziness and weakness.  Psychiatric/Behavioral:  Negative for agitation, behavioral problems and confusion.      Immunization History  Administered Date(s) Administered   Influenza, High Dose Seasonal PF 04/05/2017   Influenza-Unspecified 02/01/2022   MODERNA COVID-19 SARS-COV-2 PEDS BIVALENT BOOSTER 6Y-11Y 04/29/2019, 05/27/2019, 02/25/2022   Moderna SARS-COV2 Booster Vaccination 09/04/2020   Pfizer Covid-19 Vaccine Bivalent Booster 78yr & up 01/08/2021   Pneumococcal Conjugate-13 03/07/2018   Pneumococcal Polysaccharide-23 03/11/2019   Tdap 09/14/2010   Pertinent  Health Maintenance Due  Topic Date Due   DEXA SCAN  Never done   INFLUENZA VACCINE  Completed       No data to display         Functional Status Survey:    Vitals:   05/05/22  1148  BP: 118/79  Pulse: 76  Weight: 147 lb (66.7 kg)  Height: '5\' 3"'$  (1.6 m)   Body mass index is 26.04 kg/m. Physical Exam Constitutional:      General: She is not in acute distress.    Appearance: She is well-developed. She is not diaphoretic.  HENT:     Head: Normocephalic and atraumatic.     Mouth/Throat:     Pharynx: No oropharyngeal exudate.  Eyes:     Conjunctiva/sclera: Conjunctivae normal.     Pupils: Pupils are equal, round, and reactive to light.  Cardiovascular:     Rate and Rhythm: Normal rate and regular rhythm.     Heart sounds: Murmur heard.  Pulmonary:     Effort: Pulmonary effort is normal.     Breath sounds: Normal breath sounds.  Abdominal:     General: Bowel sounds are normal.     Palpations: Abdomen is soft.  Musculoskeletal:  Cervical back: Normal range of motion and neck supple.     Right lower leg: No edema.     Left lower leg: No edema.  Skin:    General: Skin is warm and dry.  Neurological:     Mental Status: She is alert.  Psychiatric:        Mood and Affect: Mood normal.    Labs reviewed: Recent Labs    02/17/22 0000  NA 138  K 4.3  CL 105  CO2 26*  BUN 16  CREATININE 1.1  CALCIUM 9.0   Recent Labs    02/17/22 0000  AST 14  ALT 10  ALKPHOS 51  ALBUMIN 3.6   Recent Labs    02/17/22 0000  WBC 5.1  NEUTROABS 3,111.00  HGB 12.5  PLT 145*   Lab Results  Component Value Date   TSH 1.693 03/03/2016   Lab Results  Component Value Date   HGBA1C 5.8 (H) 04/04/2017   Lab Results  Component Value Date   CHOL 138 02/17/2022   HDL 41 02/17/2022   LDLCALC 74 02/17/2022   TRIG 156 02/17/2022   CHOLHDL 6.3 04/04/2017     Significant Diagnostic Results in last 30 days:  No results found.  Assessment/Plan 1. Primary hypertension -well controlled on current regimen   2. Adult hypothyroidism TSH at goal, continue current synthroid  3. Combined hyperlipidemia LDL at goal on lipitor.   4. Gastroesophageal  reflux disease, unspecified whether esophagitis present Stable on protonix daily  5. Primary osteoarthritis involving multiple joints Ongoing but stable, continues on tylenol PRN  6. Chronic idiopathic constipation -will add colace 100 mg daily to help with bowel regimen  7. History of stroke with residual deficit -stable, has aphagia, continues on plavix.      Carlos American. [place] [phone]

## 2022-06-10 ENCOUNTER — Encounter: Payer: Self-pay | Admitting: Adult Health

## 2022-06-10 ENCOUNTER — Non-Acute Institutional Stay (SKILLED_NURSING_FACILITY): Payer: Medicare Other | Admitting: Adult Health

## 2022-06-10 DIAGNOSIS — R531 Weakness: Secondary | ICD-10-CM

## 2022-06-10 DIAGNOSIS — I10 Essential (primary) hypertension: Secondary | ICD-10-CM

## 2022-06-10 DIAGNOSIS — R112 Nausea with vomiting, unspecified: Secondary | ICD-10-CM | POA: Diagnosis not present

## 2022-06-10 DIAGNOSIS — I6930 Unspecified sequelae of cerebral infarction: Secondary | ICD-10-CM | POA: Diagnosis not present

## 2022-06-10 NOTE — Progress Notes (Unsigned)
Location:  Other Nursing Home Room Number: [id number] Place of Service:  SNF (31) Provider:  Durenda Age, NP   Patient Care Team: Dewayne Shorter, MD as PCP - General (Family Medicine)  Extended Emergency Contact Information Primary Emergency Contact: Tyson Dense Address: [address]          [place], [place] [address] Johnnette Litter of [place] Phone: [phone] Relation: Daughter Secondary Emergency Contact: [employer] of [place] Phone: [phone] Mobile Phone: [phone] Relation: Son  Code Status:  DNR  Goals of care: Advanced Directive information    06/10/2022    2:56 PM  Advanced Directives  Does Patient Have a Medical Advance Directive? Yes  Type of Paramedic of [place];Out of facility DNR (pink MOST or yellow form)  Does patient want to make changes to medical advance directive? No - Patient declined  Copy of [place] in Chart? Yes - validated most recent copy scanned in chart (See row information)  Pre-existing out of facility DNR order (yellow form or pink MOST form) Yellow form placed in chart (order not valid for inpatient use)     Chief Complaint  Patient presents with   Acute Visit    Weakness    HPI:  Pt is a 89 y.o. female seen today for an acute visit for weakness. She was reported to have vomited X 1 yesterday. She was  then noted to be weak. Today, she was seen in her room. No noted SOB. BP 113/75. She takes Losartan for hypertension. She has right hemiparesis due to history of CVA. No vomiting today.      Past Medical History:  Diagnosis Date   Cervicalgia    Depression    Essential hypertension    Hemorrhoids    History of MRSA infection    Hyperlipidemia    Hypertension    Hypothyroidism    IBS (irritable bowel syndrome)    Osteoarthritis of multiple joints    Restless leg    Senile osteoporosis    Shingles    Sleep apnea    Ulcerative  colitis ([place])    Urge incontinence    Past Surgical History:  Procedure Laterality Date   ABDOMINAL HYSTERECTOMY  1980   cataract surgery     CHOLECYSTECTOMY  1955   colon polyp removal  12/2012   ESOPHAGOGASTRODUODENOSCOPY  2014   makoplasty Left 06/2012   parotid gland removal  1984   VISCERAL ANGIOGRAPHY N/A 09/13/2017   Procedure: VISCERAL ANGIOGRAPHY;  Surgeon: Katha Cabal, MD;  Location: [facility];  Service: Cardiovascular;  Laterality: N/A;    Allergies  Allergen Reactions   Codeine Sulfate Hives and Itching    Can take tramadol   Codeine Other (See Comments)    Outpatient Encounter Medications as of 06/10/2022  Medication Sig   acetaminophen (TYLENOL) 325 MG tablet Take 650 mg by mouth every 8 (eight) hours as needed for mild pain or moderate pain.   atorvastatin (LIPITOR) 40 MG tablet Take 1 tablet (40 mg total) by mouth daily at 6 PM.   Calcium Carbonate-Vitamin D (CALCIUM 500 + D) 500-125 MG-UNIT TABS Take 1 tablet by mouth daily.    Cholecalciferol (D3-50) 50000 units capsule Take 50,000 Units by mouth every 30 (thirty) days.   clopidogrel (PLAVIX) 75 MG tablet Take 75 mg by mouth daily.   cyanocobalamin 1000 MCG tablet Take 1,000 mcg by mouth daily.   docusate sodium (COLACE) 100 MG capsule  Take 100 mg by mouth daily.   DULoxetine (CYMBALTA) 30 MG capsule Take 30 mg by mouth daily.   fluticasone (FLONASE) 50 MCG/ACT nasal spray Place 2 sprays into both nostrils daily.   levothyroxine (SYNTHROID, LEVOTHROID) 50 MCG tablet Take 50 mcg by mouth daily before breakfast.   losartan (COZAAR) 50 MG tablet Take 50 mg by mouth daily.    magnesium hydroxide (MILK OF MAGNESIA) 400 MG/5ML suspension Take 30 mLs by mouth daily as needed for mild constipation.   mirtazapine (REMERON) 7.5 MG tablet Take 7.5 mg by mouth at bedtime.   Multiple Vitamins-Minerals (MULTIVITAMIN ADULT PO) Take 1 tablet by mouth daily.    nystatin (MYCOSTATIN/NYSTOP) powder Apply 1  Application topically 2 (two) times daily. Topically to breasts as needed for rash.   ondansetron (ZOFRAN ODT) 4 MG disintegrating tablet Take 1 tablet (4 mg total) by mouth every 8 (eight) hours as needed for nausea or vomiting.   pantoprazole (PROTONIX) 40 MG tablet Take 1 tablet (40 mg total) by mouth daily.   No facility-administered encounter medications on file as of 06/10/2022.    Review of Systems  Constitutional:  Negative for appetite change, chills, fatigue and fever.  HENT:  Negative for congestion, hearing loss, rhinorrhea and sore throat.   Eyes: Negative.   Respiratory:  Negative for cough, shortness of breath and wheezing.   Cardiovascular:  Negative for chest pain, palpitations and leg swelling.  Gastrointestinal:  Negative for abdominal pain, constipation, diarrhea, nausea and vomiting.  Genitourinary:  Negative for dysuria.  Musculoskeletal:  Negative for arthralgias, back pain and myalgias.  Skin:  Negative for color change, rash and wound.  Neurological:  Negative for dizziness, weakness and headaches.  Psychiatric/Behavioral:  Negative for behavioral problems. The patient is not nervous/anxious.     Unable to obtain due to aphasia.  Immunization History  Administered Date(s) Administered   Influenza, High Dose Seasonal PF 04/05/2017   Influenza-Unspecified 02/01/2022   MODERNA COVID-19 SARS-COV-2 PEDS BIVALENT BOOSTER 6Y-11Y 04/29/2019, 05/27/2019, 02/25/2022   Moderna SARS-COV2 Booster Vaccination 09/04/2020   Pfizer Covid-19 Vaccine Bivalent Booster 78yr & up 01/08/2021   Pneumococcal Conjugate-13 03/07/2018   Pneumococcal Polysaccharide-23 03/11/2019   Tdap 09/14/2010   Pertinent  Health Maintenance Due  Topic Date Due   DEXA SCAN  Never done   INFLUENZA VACCINE  Completed      06/10/2022    2:55 PM  FWild Rosein the past year? 0  Was there an injury with Fall? 0  Fall Risk Category Calculator 0  Patient at Risk for Falls Due to No Fall Risks   Fall risk Follow up Falls evaluation completed   Functional Status Survey:    Vitals:   06/10/22 1444  BP: 122/75  Pulse: 62  Resp: 20  Temp: (!) 97.4 F (36.3 C)  SpO2: 96%  Weight: 148 lb 8 oz (67.4 kg)  Height: '5\' 3"'$  (1.6 m)   Body mass index is 26.31 kg/m. Physical Exam Constitutional:      General: She is not in acute distress.    Appearance: Normal appearance.  HENT:     Head: Normocephalic and atraumatic.     Nose: Nose normal.     Mouth/Throat:     Mouth: Mucous membranes are moist.  Eyes:     Conjunctiva/sclera: Conjunctivae normal.  Cardiovascular:     Rate and Rhythm: Normal rate and regular rhythm.     Heart sounds: Murmur heard.  Pulmonary:     Effort:  Pulmonary effort is normal.     Breath sounds: Normal breath sounds.  Abdominal:     General: Bowel sounds are normal.     Palpations: Abdomen is soft.  Musculoskeletal:     Cervical back: Normal range of motion.  Skin:    General: Skin is warm and dry.  Neurological:     Mental Status: She is alert. Mental status is at baseline.     Comments: Alert to self, disoriented to time and place.  Right-sided weakness  Psychiatric:        Mood and Affect: Mood normal.        Behavior: Behavior normal.     Labs reviewed: Recent Labs    02/17/22 0000  NA 138  K 4.3  CL 105  CO2 26*  BUN 16  CREATININE 1.1  CALCIUM 9.0   Recent Labs    02/17/22 0000  AST 14  ALT 10  ALKPHOS 51  ALBUMIN 3.6   Recent Labs    02/17/22 0000  WBC 5.1  NEUTROABS 3,111.00  HGB 12.5  PLT 145*   Lab Results  Component Value Date   TSH 1.693 03/03/2016   Lab Results  Component Value Date   HGBA1C 5.8 (H) 04/04/2017   Lab Results  Component Value Date   CHOL 138 02/17/2022   HDL 41 02/17/2022   LDLCALC 74 02/17/2022   TRIG 156 02/17/2022   CHOLHDL 6.3 04/04/2017    Significant Diagnostic Results in last 30 days:  No results found.  Assessment/Plan  1. Generalized weakness -  staff reported  that she seems to be back to her usual  -  will check labs to rule out infections:  CBC with differentials, BMP and UA with CS  2. Nausea and vomiting, unspecified vomiting type -  vomited X 1 yesterday -  continue Zofran PRN  3. History of stroke with residual deficit -  stable -  continue Plavix and Atorvastatin  4. Primary hypertension -   BP113/75, stable -  continue Losartan    Family/ staff Communication:  Discussed plan of care with charge nurse.  Labs/tests ordered:   CBC, BMP and UA with CS

## 2022-06-13 DIAGNOSIS — I10 Essential (primary) hypertension: Secondary | ICD-10-CM | POA: Diagnosis not present

## 2022-06-13 DIAGNOSIS — E039 Hypothyroidism, unspecified: Secondary | ICD-10-CM | POA: Diagnosis not present

## 2022-06-13 LAB — BASIC METABOLIC PANEL
BUN: 20 (ref 4–21)
CO2: 25 — AB (ref 13–22)
Chloride: 103 (ref 99–108)
Creatinine: 1 (ref 0.5–1.1)
Glucose: 87
Potassium: 4.1 mEq/L (ref 3.5–5.1)
Sodium: 136 — AB (ref 137–147)

## 2022-06-13 LAB — CBC: RBC: 4.16 (ref 3.87–5.11)

## 2022-06-13 LAB — CBC AND DIFFERENTIAL
HCT: 36 (ref 36–46)
Hemoglobin: 12.1 (ref 12.0–16.0)
Neutrophils Absolute: 2980
Platelets: 109 10*3/uL — AB (ref 150–400)
WBC: 4.7

## 2022-06-13 LAB — COMPREHENSIVE METABOLIC PANEL
Calcium: 8.6 — AB (ref 8.7–10.7)
eGFR: 52

## 2022-06-13 LAB — TSH: TSH: 2.04 (ref 0.41–5.90)

## 2022-06-19 DIAGNOSIS — R0989 Other specified symptoms and signs involving the circulatory and respiratory systems: Secondary | ICD-10-CM | POA: Diagnosis not present

## 2022-06-27 ENCOUNTER — Encounter: Payer: Self-pay | Admitting: Adult Health

## 2022-06-27 ENCOUNTER — Non-Acute Institutional Stay (SKILLED_NURSING_FACILITY): Payer: Medicare Other | Admitting: Adult Health

## 2022-06-27 DIAGNOSIS — J209 Acute bronchitis, unspecified: Secondary | ICD-10-CM | POA: Diagnosis not present

## 2022-06-27 NOTE — Progress Notes (Signed)
Location:  Other [facility].  Nursing Home Room Number: [place] of Service:  SNF [phone]) Provider:  Durenda Age, NP  PCP: Dewayne Shorter, MD  Patient Care Team: Dewayne Shorter, MD as PCP - General [facility] Medicine)  Extended Emergency Contact Information Primary Emergency Contact: Tyson Dense Address: [address]          [place], [place] [address] Johnnette Litter of [place] Phone: [phone] Relation: Daughter Secondary Emergency Contact: [employer] of [place] Phone: [phone] Mobile Phone: [phone] Relation: Son  Code Status:  DNR Goals of care: Advanced Directive information    06/27/2022    3:28 PM  Advanced Directives  Does Patient Have a Medical Advance Directive? Yes  Type of Advance Directive Out of facility DNR (pink MOST or yellow form)  Does patient want to make changes to medical advance directive? No - Patient declined     Chief Complaint  Patient presents with   Acute Visit    Cough    HPI:  Pt is a 89 y.o. female seen today for an acute visit for cough. She has a PMH of dementia, ulcerative colitis and hypertension. She was seen in her room today. She has productive cough and was started on Tussin PRN. Chest x-ray done on 06/19/22 was negative for pneumonia. No SOB nor chills reported.   Past Medical History:  Diagnosis Date   Cervicalgia    Depression    Essential hypertension    Hemorrhoids    History of MRSA infection    Hyperlipidemia    Hypertension    Hypothyroidism    IBS (irritable bowel syndrome)    Osteoarthritis of multiple joints    Restless leg    Senile osteoporosis    Shingles    Sleep apnea    Ulcerative colitis ([place])    Urge incontinence    Past Surgical History:  Procedure Laterality Date   ABDOMINAL HYSTERECTOMY  1980   cataract surgery     CHOLECYSTECTOMY  1955   colon polyp removal  12/2012   ESOPHAGOGASTRODUODENOSCOPY  2014   makoplasty  Left 06/2012   parotid gland removal  1984   VISCERAL ANGIOGRAPHY N/A 09/13/2017   Procedure: VISCERAL ANGIOGRAPHY;  Surgeon: Katha Cabal, MD;  Location: [facility];  Service: Cardiovascular;  Laterality: N/A;    Allergies  Allergen Reactions   Codeine Sulfate Hives and Itching    Can take tramadol   Codeine Other (See Comments)    Outpatient Encounter Medications as of 06/27/2022  Medication Sig   acetaminophen (TYLENOL) 325 MG tablet Take 650 mg by mouth every 8 (eight) hours as needed for mild pain or moderate pain.   atorvastatin (LIPITOR) 40 MG tablet Take 1 tablet (40 mg total) by mouth daily at 6 PM.   Calcium Carbonate-Vitamin D (CALCIUM 500 + D) 500-125 MG-UNIT TABS Take 1 tablet by mouth daily.    Cholecalciferol (D3-50) 50000 units capsule Take 50,000 Units by mouth every 30 (thirty) days.   clopidogrel (PLAVIX) 75 MG tablet Take 75 mg by mouth daily.   cyanocobalamin 1000 MCG tablet Take 1,000 mcg by mouth daily.   docusate sodium (COLACE) 100 MG capsule Take 100 mg by mouth daily.   DULoxetine (CYMBALTA) 30 MG capsule Take 30 mg by mouth daily.   fluticasone (FLONASE) 50 MCG/ACT nasal spray Place 2 sprays into both nostrils daily.   guaiFENesin (ROBITUSSIN) 100 MG/5ML liquid Take 10 mLs by mouth every 4 (four) hours  as needed for cough or to loosen phlegm.   levothyroxine (SYNTHROID, LEVOTHROID) 50 MCG tablet Take 50 mcg by mouth daily before breakfast.   losartan (COZAAR) 50 MG tablet Take 50 mg by mouth daily.    magnesium hydroxide (MILK OF MAGNESIA) 400 MG/5ML suspension Take 30 mLs by mouth daily as needed for mild constipation.   mirtazapine (REMERON) 7.5 MG tablet Take 7.5 mg by mouth at bedtime.   Multiple Vitamins-Minerals (MULTIVITAMIN ADULT PO) Take 1 tablet by mouth daily.    nystatin (MYCOSTATIN/NYSTOP) powder Apply 1 Application topically 2 (two) times daily. Topically to breasts as needed for rash.   ondansetron (ZOFRAN) 4 MG tablet Take 4 mg  by mouth every 8 (eight) hours as needed for nausea or vomiting. Give Two tablets by mouth every 8 hours as needed for nausea and vomiting.   pantoprazole (PROTONIX) 40 MG tablet Take 40 mg by mouth daily.   [DISCONTINUED] ondansetron (ZOFRAN ODT) 4 MG disintegrating tablet Take 1 tablet (4 mg total) by mouth every 8 (eight) hours as needed for nausea or vomiting.   [DISCONTINUED] pantoprazole (PROTONIX) 40 MG tablet Take 1 tablet (40 mg total) by mouth daily. (Patient taking differently: Take 40 mg by mouth daily. Give Two tablets by mouth every 8 hours as needed for nausea and vomiting.)   No facility-administered encounter medications on file as of 06/27/2022.    Review of Systems  Constitutional:  Negative for appetite change, chills, fatigue and fever.  HENT:  Negative for congestion, hearing loss, rhinorrhea and sore throat.   Eyes: Negative.   Respiratory:  Positive for cough. Negative for shortness of breath and wheezing.   Cardiovascular:  Negative for chest pain, palpitations and leg swelling.  Gastrointestinal:  Negative for abdominal pain, constipation, diarrhea, nausea and vomiting.  Genitourinary:  Negative for dysuria.  Musculoskeletal:  Negative for arthralgias, back pain and myalgias.  Skin:  Negative for color change, rash and wound.  Neurological:  Negative for dizziness, weakness and headaches.  Psychiatric/Behavioral:  Negative for behavioral problems. The patient is not nervous/anxious.     Immunization History  Administered Date(s) Administered   Influenza, High Dose Seasonal PF 04/05/2017   Influenza-Unspecified 02/01/2022   MODERNA COVID-19 SARS-COV-2 PEDS BIVALENT BOOSTER 6Y-11Y 04/29/2019, 05/27/2019, 02/25/2022   Moderna SARS-COV2 Booster Vaccination 09/04/2020   Pfizer Covid-19 Vaccine Bivalent Booster 33yrs & up 02/28/2020, 01/08/2021   Pneumococcal Conjugate-13 03/07/2018   Pneumococcal Polysaccharide-23 03/11/2019   Tdap 09/14/2010   Pertinent  Health  Maintenance Due  Topic Date Due   DEXA SCAN  Never done   INFLUENZA VACCINE  Completed      06/10/2022    2:55 PM  [place] in the past year? 0  Was there an injury with Fall? 0  Fall Risk Category Calculator 0  Patient at Risk for Falls Due to No Fall Risks  Fall risk Follow up Falls evaluation completed   Functional Status Survey:    Vitals:   06/27/22 1517  BP: 121/75  Pulse: 76  Resp: 18  Temp: 98.1 F (36.7 C)  SpO2: 90%  Weight: 148 lb (67.1 kg)  Height: 5\' 3"  (1.6 m)   Body mass index is 26.22 kg/m. Physical Exam Constitutional:      Appearance: Normal appearance.  HENT:     Head: Normocephalic and atraumatic.     Nose: Nose normal.     Mouth/Throat:     Mouth: Mucous membranes are moist.  Eyes:     Conjunctiva/sclera:  Conjunctivae normal.  Cardiovascular:     Rate and Rhythm: Normal rate and regular rhythm.  Pulmonary:     Effort: Pulmonary effort is normal.     Breath sounds: Rales present.  Abdominal:     General: Bowel sounds are normal.     Palpations: Abdomen is soft.  Musculoskeletal:     Cervical back: Normal range of motion.  Skin:    General: Skin is warm and dry.  Neurological:     Mental Status: She is alert. Mental status is at baseline.  Psychiatric:        Mood and Affect: Mood normal.        Behavior: Behavior normal.        Thought Content: Thought content normal.        Judgment: Judgment normal.     Labs reviewed: Recent Labs    02/17/22 0000 06/13/22 0000  NA 138 136*  K 4.3 4.1  CL 105 103  CO2 26* 25*  BUN 16 20  CREATININE 1.1 1.0  CALCIUM 9.0 8.6*   Recent Labs    02/17/22 0000  AST 14  ALT 10  ALKPHOS 51  ALBUMIN 3.6   Recent Labs    02/17/22 0000 06/13/22 0000  WBC 5.1 4.7  NEUTROABS 3,111.00 2,980.00  HGB 12.5 12.1  HCT  --  36  PLT 145* 109*   Lab Results  Component Value Date   TSH 2.04 06/13/2022   Lab Results  Component Value Date   HGBA1C 5.8 (H) 04/04/2017   Lab Results   Component Value Date   CHOL 138 02/17/2022   HDL 41 02/17/2022   LDLCALC 74 02/17/2022   TRIG 156 02/17/2022   CHOLHDL 6.3 04/04/2017    Significant Diagnostic Results in last 30 days:  No results found.  Assessment/Plan  1. Acute bronchitis, unspecified organism -  will start on Doxycycline 100 mg BID X 10 days and Florastor 250 mg BID X 13 days -  discontinue Tussin PRN -  start on Mucinex 600 mg BID X 10 days -  encourage fluid intake   Family/ staff Communication:  Discussed plan of care with resident and charge nurse.  Labs/tests ordered:  None

## 2022-06-29 ENCOUNTER — Non-Acute Institutional Stay (SKILLED_NURSING_FACILITY): Payer: Medicare Other | Admitting: Student

## 2022-06-29 ENCOUNTER — Encounter: Payer: Self-pay | Admitting: Student

## 2022-06-29 DIAGNOSIS — E039 Hypothyroidism, unspecified: Secondary | ICD-10-CM

## 2022-06-29 DIAGNOSIS — I10 Essential (primary) hypertension: Secondary | ICD-10-CM

## 2022-06-29 DIAGNOSIS — R059 Cough, unspecified: Secondary | ICD-10-CM | POA: Diagnosis not present

## 2022-06-29 DIAGNOSIS — R052 Subacute cough: Secondary | ICD-10-CM

## 2022-06-29 DIAGNOSIS — R0989 Other specified symptoms and signs involving the circulatory and respiratory systems: Secondary | ICD-10-CM | POA: Diagnosis not present

## 2022-06-29 DIAGNOSIS — I69390 Apraxia following cerebral infarction: Secondary | ICD-10-CM

## 2022-06-29 DIAGNOSIS — K219 Gastro-esophageal reflux disease without esophagitis: Secondary | ICD-10-CM

## 2022-06-29 NOTE — Progress Notes (Signed)
Location:  Other [facility]  Nursing Home Room Number: [facility] [address]A Place of Service:  SNF [phone]) Provider:  Earnestine Mealing, MD  Patient Care Team: Earnestine Mealing, MD as PCP - General [facility] Medicine)  Extended Emergency Contact Information Primary Emergency Contact: Fayette Pho Address: [address]          [place], [place] [address] [place] Home Phone: [phone] Relation: Daughter Secondary Emergency Contact: Satira Sark [place] Home Phone: [phone] Mobile Phone: [phone] Relation: Son  Code Status:  DNR Goals of care: Advanced Directive information    06/29/2022   11:03 AM  Advanced Directives  Does Patient Have a Medical Advance Directive? Yes  Type of Advance Directive Out of facility DNR (pink MOST or yellow form)  Does patient want to make changes to medical advance directive? No - Patient declined  Copy of Healthcare Power of Attorney in Chart? Yes - validated most recent copy scanned in chart (See row information)     Chief Complaint  Patient presents with   Acute Visit    Cough and Congestion.     HPI:  Pt is a 89 y.o. female seen today for an acute and routine visit for cough and congestion. Per nursing patient has had progressive cough and congestion for the last few weeks.   Patient is alert. Oriented to self, however, due to apraxia of speech, unable to clearly communicate history. States she is currently comfortable.   Attempted to contact RP listed and left voicemail to contact nursing.    Past Medical History:  Diagnosis Date   Cervicalgia    Depression    Essential hypertension    Hemorrhoids    History of MRSA infection    Hyperlipidemia    Hypertension    Hypothyroidism    IBS (irritable bowel syndrome)    Osteoarthritis of multiple joints    Restless leg    Senile osteoporosis    Shingles    Sleep apnea    Ulcerative colitis (HCC)    Urge incontinence    Past  Surgical History:  Procedure Laterality Date   ABDOMINAL HYSTERECTOMY  1980   cataract surgery     CHOLECYSTECTOMY  1955   colon polyp removal  12/2012   ESOPHAGOGASTRODUODENOSCOPY  2014   makoplasty Left 06/2012   parotid gland removal  1984   VISCERAL ANGIOGRAPHY N/A 09/13/2017   Procedure: VISCERAL ANGIOGRAPHY;  Surgeon: Renford Dills, MD;  Location: [facility] INVASIVE CV LAB;  Service: Cardiovascular;  Laterality: N/A;    Allergies  Allergen Reactions   Codeine Sulfate Hives and Itching    Can take tramadol   Codeine Other (See Comments)    Outpatient Encounter Medications as of 06/29/2022  Medication Sig   acetaminophen (TYLENOL) 325 MG tablet Take 650 mg by mouth every 8 (eight) hours as needed for mild pain or moderate pain.   atorvastatin (LIPITOR) 40 MG tablet Take 1 tablet (40 mg total) by mouth daily at 6 PM.   Calcium Carbonate-Vitamin D (CALCIUM 500 + D) 500-125 MG-UNIT TABS Take 1 tablet by mouth daily.    Cholecalciferol (D3-50) 50000 units capsule Take 50,000 Units by mouth every 30 (thirty) days.   clopidogrel (PLAVIX) 75 MG tablet Take 75 mg by mouth daily.   cyanocobalamin 1000 MCG tablet Take 1,000 mcg by mouth daily.   docusate sodium (COLACE) 100 MG capsule Take 100 mg by mouth daily.   doxycycline (ADOXA) 100 MG tablet Take 100 mg by  mouth 2 (two) times daily.   DULoxetine (CYMBALTA) 30 MG capsule Take 30 mg by mouth daily.   fluticasone (FLONASE) 50 MCG/ACT nasal spray Place 2 sprays into both nostrils daily.   guaiFENesin (ROBITUSSIN) 100 MG/5ML liquid Take 10 mLs by mouth every 4 (four) hours as needed for cough or to loosen phlegm.   levothyroxine (SYNTHROID, LEVOTHROID) 50 MCG tablet Take 50 mcg by mouth daily before breakfast.   losartan (COZAAR) 50 MG tablet Take 50 mg by mouth daily.    magnesium hydroxide (MILK OF MAGNESIA) 400 MG/5ML suspension Take 30 mLs by mouth daily as needed for mild constipation.   mirtazapine (REMERON) 7.5 MG tablet Take 7.5  mg by mouth at bedtime.   Multiple Vitamins-Minerals (MULTIVITAMIN ADULT PO) Take 1 tablet by mouth daily.    nystatin (MYCOSTATIN/NYSTOP) powder Apply 1 Application topically 2 (two) times daily. Topically to breasts as needed for rash.   ondansetron (ZOFRAN) 4 MG tablet Take 4 mg by mouth every 8 (eight) hours as needed for nausea or vomiting. Give Two tablets by mouth every 8 hours as needed for nausea and vomiting.   pantoprazole (PROTONIX) 40 MG tablet Take 40 mg by mouth daily.   saccharomyces boulardii (FLORASTOR) 250 MG capsule Take 250 mg by mouth 2 (two) times daily.   No facility-administered encounter medications on file as of 06/29/2022.    Review of Systems  Immunization History  Administered Date(s) Administered   Influenza, High Dose Seasonal PF 04/05/2017   Influenza-Unspecified 02/01/2022   MODERNA COVID-19 SARS-COV-2 PEDS BIVALENT BOOSTER 6Y-11Y 04/29/2019, 05/27/2019, 02/25/2022   Moderna SARS-COV2 Booster Vaccination 09/04/2020   Pfizer Covid-19 Vaccine Bivalent Booster 92yrs & up 02/28/2020, 01/08/2021   Pneumococcal Conjugate-13 03/07/2018   Pneumococcal Polysaccharide-23 03/11/2019   Tdap 09/14/2010   Pertinent  Health Maintenance Due  Topic Date Due   DEXA SCAN  Never done   INFLUENZA VACCINE  Completed      06/10/2022    2:55 PM  Fall Risk  Falls in the past year? 0  Was there an injury with Fall? 0  Fall Risk Category Calculator 0  Patient at Risk for Falls Due to No Fall Risks  Fall risk Follow up Falls evaluation completed   Functional Status Survey:    Vitals:   06/29/22 1052  BP: 114/73  Pulse: 68  Resp: 20  Temp: (!) 97 F (36.1 C)  SpO2: 98%  Weight: 148 lb (67.1 kg)  Height: 5\' 3"  (1.6 m)   Body mass index is 26.22 kg/m. Physical Exam Constitutional:      Appearance: Normal appearance.  Cardiovascular:     Rate and Rhythm: Normal rate.     Pulses: Normal pulses.  Pulmonary:     Comments: Bilateral ronchi in lower lobes, no  wheezes or increased work of breathing.  Skin:    General: Skin is warm and dry.  Neurological:     Mental Status: She is alert. Mental status is at baseline. She is disoriented.     Labs reviewed: Recent Labs    02/17/22 0000 06/13/22 0000  NA 138 136*  K 4.3 4.1  CL 105 103  CO2 26* 25*  BUN 16 20  CREATININE 1.1 1.0  CALCIUM 9.0 8.6*   Recent Labs    02/17/22 0000  AST 14  ALT 10  ALKPHOS 51  ALBUMIN 3.6   Recent Labs    02/17/22 0000 06/13/22 0000  WBC 5.1 4.7  NEUTROABS 3,111.00 2,980.00  HGB 12.5 12.1  HCT  --  36  PLT 145* 109*   Lab Results  Component Value Date   TSH 2.04 06/13/2022   Lab Results  Component Value Date   HGBA1C 5.8 (H) 04/04/2017   Lab Results  Component Value Date   CHOL 138 02/17/2022   HDL 41 02/17/2022   LDLCALC 74 02/17/2022   TRIG 156 02/17/2022   CHOLHDL 6.3 04/04/2017    Significant Diagnostic Results in last 30 days:  No results found.  Assessment/Plan Subacute cough  Apraxia due to and not concurrent with ischemic cerebrovascular accident (CVA)  Primary hypertension  Gastroesophageal reflux disease, unspecified whether esophagitis present  Adult hypothyroidism Patient seen today for subacute cough that has worsened. She had a CXR 2/15 which was negative at this time, however, congestion has become progressively worse. She has notable rhonchi present, however, remains afebrile and on room air -- symptoms most consistent with aspiration. Negative COVID and flu performed. Will also perform RVP. Concern for aspiration will also order SLP for evaluation and treat. Patient's dementia has progressed in recent months and some concern this is a reflection of her decline. At this time BP well-controlled and stable. Continue losartan 50 mg daily. Given hx of stroke, will continue plavix, atorvastatin. Hx of hypothyroidism, most recent TSH at goal range, continue current dose of levothyroxine 50 mcg. Unclear if GERD is  contributing to symptoms, will continue protonix daily. Continue guaifenesin PRN for cough. Despite inability to communicate with family, patient has a DNH and DNR ordered from previous conversations. ABX pending lab and imaging results.    Family/ staff Communication: nursing  Labs/tests ordered:  SLP evaluation, CXR, CBC, BMP

## 2022-06-30 DIAGNOSIS — J22 Unspecified acute lower respiratory infection: Secondary | ICD-10-CM | POA: Diagnosis not present

## 2022-06-30 DIAGNOSIS — Z1383 Encounter for screening for respiratory disorder NEC: Secondary | ICD-10-CM | POA: Diagnosis not present

## 2022-06-30 DIAGNOSIS — I10 Essential (primary) hypertension: Secondary | ICD-10-CM | POA: Diagnosis not present

## 2022-06-30 DIAGNOSIS — R531 Weakness: Secondary | ICD-10-CM | POA: Diagnosis not present

## 2022-06-30 DIAGNOSIS — R051 Acute cough: Secondary | ICD-10-CM | POA: Diagnosis not present

## 2022-07-05 DIAGNOSIS — Z20818 Contact with and (suspected) exposure to other bacterial communicable diseases: Secondary | ICD-10-CM | POA: Diagnosis not present

## 2022-07-05 DIAGNOSIS — B950 Streptococcus, group A, as the cause of diseases classified elsewhere: Secondary | ICD-10-CM | POA: Diagnosis not present

## 2022-07-07 DIAGNOSIS — R1312 Dysphagia, oropharyngeal phase: Secondary | ICD-10-CM | POA: Diagnosis not present

## 2022-07-07 DIAGNOSIS — F0150 Vascular dementia without behavioral disturbance: Secondary | ICD-10-CM | POA: Diagnosis not present

## 2022-07-13 DIAGNOSIS — F0150 Vascular dementia without behavioral disturbance: Secondary | ICD-10-CM | POA: Diagnosis not present

## 2022-07-13 DIAGNOSIS — R1312 Dysphagia, oropharyngeal phase: Secondary | ICD-10-CM | POA: Diagnosis not present

## 2022-07-18 DIAGNOSIS — R1312 Dysphagia, oropharyngeal phase: Secondary | ICD-10-CM | POA: Diagnosis not present

## 2022-07-18 DIAGNOSIS — F0150 Vascular dementia without behavioral disturbance: Secondary | ICD-10-CM | POA: Diagnosis not present

## 2022-07-20 DIAGNOSIS — R1312 Dysphagia, oropharyngeal phase: Secondary | ICD-10-CM | POA: Diagnosis not present

## 2022-07-20 DIAGNOSIS — F0150 Vascular dementia without behavioral disturbance: Secondary | ICD-10-CM | POA: Diagnosis not present

## 2022-07-26 DIAGNOSIS — Z23 Encounter for immunization: Secondary | ICD-10-CM | POA: Diagnosis not present

## 2022-08-17 DIAGNOSIS — N390 Urinary tract infection, site not specified: Secondary | ICD-10-CM | POA: Diagnosis not present

## 2022-08-18 DIAGNOSIS — I10 Essential (primary) hypertension: Secondary | ICD-10-CM | POA: Diagnosis not present

## 2022-08-18 LAB — CBC AND DIFFERENTIAL
HCT: 36 (ref 36–46)
Hemoglobin: 11.8 — AB (ref 12.0–16.0)
Platelets: 125 10*3/uL — AB (ref 150–400)
WBC: 5.3

## 2022-08-18 LAB — CBC: RBC: 4.06 (ref 3.87–5.11)

## 2022-08-25 ENCOUNTER — Encounter: Payer: Self-pay | Admitting: Nurse Practitioner

## 2022-08-25 ENCOUNTER — Non-Acute Institutional Stay (SKILLED_NURSING_FACILITY): Payer: Medicare Other | Admitting: Nurse Practitioner

## 2022-08-25 DIAGNOSIS — E559 Vitamin D deficiency, unspecified: Secondary | ICD-10-CM | POA: Diagnosis not present

## 2022-08-25 DIAGNOSIS — E782 Mixed hyperlipidemia: Secondary | ICD-10-CM

## 2022-08-25 DIAGNOSIS — E039 Hypothyroidism, unspecified: Secondary | ICD-10-CM

## 2022-08-25 DIAGNOSIS — I6930 Unspecified sequelae of cerebral infarction: Secondary | ICD-10-CM

## 2022-08-25 DIAGNOSIS — K219 Gastro-esophageal reflux disease without esophagitis: Secondary | ICD-10-CM

## 2022-08-25 DIAGNOSIS — K5904 Chronic idiopathic constipation: Secondary | ICD-10-CM

## 2022-08-25 DIAGNOSIS — M159 Polyosteoarthritis, unspecified: Secondary | ICD-10-CM

## 2022-08-25 DIAGNOSIS — I10 Essential (primary) hypertension: Secondary | ICD-10-CM | POA: Diagnosis not present

## 2022-08-25 DIAGNOSIS — R634 Abnormal weight loss: Secondary | ICD-10-CM | POA: Diagnosis not present

## 2022-08-25 NOTE — Progress Notes (Signed)
Location:  Other [facility].  Nursing Home Room Number: [facility] [address]A Place of Service:  SNF [phone]) Wyatt Mage  PCP: Earnestine Mealing, MD  Patient Care Team: Earnestine Mealing, MD as PCP - General [facility] Medicine)  Extended Emergency Contact Information Primary Emergency Contact: Fayette Pho Address: [address]          [place], [place] [address] [place] of [place] Home Phone: [phone] Relation: Daughter Secondary Emergency Contact: Satira Sark [place] Home Phone: [phone] Mobile Phone: [phone] Relation: Son  Goals of care: Advanced Directive information    08/25/2022   12:52 PM  Advanced Directives  Does Patient Have a Medical Advance Directive? Yes  Type of Advance Directive Out of facility DNR (pink MOST or yellow form);Healthcare Power of Attorney  Does patient want to make changes to medical advance directive? No - Patient declined  Copy of Healthcare Power of Attorney in Chart? Yes - validated most recent copy scanned in chart (See row information)     Chief Complaint  Patient presents with   Medical Management of Chronic Issues    Medical Management of Chronic Issues.     HPI:  Pt is a 89 y.o. female seen today for medical management of chronic disease.  Pt at [facility] for long term care. She has hx of CVA, hypothyroid, htn, hyperlipidemia, IBS, OAB.  Nursing reports episodes of vomiting occasionally (once a week for the past few weeks). Does not report nausea. She is currently is being treated for a UTI.  Has had some weight loss. She is on Remeron for appetite.     Past Medical History:  Diagnosis Date   Cervicalgia    Depression    Essential hypertension    Hemorrhoids    History of MRSA infection    Hyperlipidemia    Hypertension    Hypothyroidism    IBS (irritable bowel syndrome)    Osteoarthritis of multiple joints    Restless leg    Senile osteoporosis    Shingles    Sleep  apnea    Ulcerative colitis (HCC)    Urge incontinence    Past Surgical History:  Procedure Laterality Date   ABDOMINAL HYSTERECTOMY  1980   cataract surgery     CHOLECYSTECTOMY  1955   colon polyp removal  12/2012   ESOPHAGOGASTRODUODENOSCOPY  2014   makoplasty Left 06/2012   parotid gland removal  1984   VISCERAL ANGIOGRAPHY N/A 09/13/2017   Procedure: VISCERAL ANGIOGRAPHY;  Surgeon: Renford Dills, MD;  Location: [facility] INVASIVE CV LAB;  Service: Cardiovascular;  Laterality: N/A;    Allergies  Allergen Reactions   Codeine Sulfate Hives and Itching    Can take tramadol   Codeine Other (See Comments)    Outpatient Encounter Medications as of 08/25/2022  Medication Sig   acetaminophen (TYLENOL) 325 MG tablet Take 650 mg by mouth every 8 (eight) hours as needed for mild pain or moderate pain.   amoxicillin-clavulanate (AUGMENTIN) 875-125 MG tablet Take 1 tablet by mouth 2 (two) times daily.   atorvastatin (LIPITOR) 40 MG tablet Take 1 tablet (40 mg total) by mouth daily at 6 PM.   Calcium Carbonate-Vitamin D (CALCIUM 500 + D) 500-125 MG-UNIT TABS Take 1 tablet by mouth daily.    Cholecalciferol (D3-50) 50000 units capsule Take 50,000 Units by mouth every 30 (thirty) days.   clopidogrel (PLAVIX) 75 MG tablet Take 75 mg by mouth daily.   cyanocobalamin 1000 MCG tablet Take 1,000 mcg by  mouth daily.   docusate sodium (COLACE) 100 MG capsule Take 100 mg by mouth daily.   DULoxetine (CYMBALTA) 30 MG capsule Take 30 mg by mouth daily.   fluticasone (FLONASE) 50 MCG/ACT nasal spray Place 2 sprays into both nostrils daily.   levothyroxine (SYNTHROID, LEVOTHROID) 50 MCG tablet Take 50 mcg by mouth daily before breakfast.   losartan (COZAAR) 50 MG tablet Take 50 mg by mouth daily.    magnesium hydroxide (MILK OF MAGNESIA) 400 MG/5ML suspension Take 30 mLs by mouth daily as needed for mild constipation.   mirtazapine (REMERON) 7.5 MG tablet Take 7.5 mg by mouth at bedtime.   Multiple  Vitamins-Minerals (MULTIVITAMIN ADULT PO) Take 1 tablet by mouth daily.    nystatin (MYCOSTATIN/NYSTOP) powder Apply 1 Application topically 2 (two) times daily. Topically to breasts as needed for rash.   ondansetron (ZOFRAN) 4 MG tablet Take 4 mg by mouth every 8 (eight) hours as needed for nausea or vomiting. Give Two tablets by mouth every 8 hours as needed for nausea and vomiting.   pantoprazole (PROTONIX) 40 MG tablet Take 40 mg by mouth daily.   [DISCONTINUED] doxycycline (ADOXA) 100 MG tablet Take 100 mg by mouth 2 (two) times daily.   [DISCONTINUED] guaiFENesin (ROBITUSSIN) 100 MG/5ML liquid Take 10 mLs by mouth every 4 (four) hours as needed for cough or to loosen phlegm.   [DISCONTINUED] saccharomyces boulardii (FLORASTOR) 250 MG capsule Take 250 mg by mouth 2 (two) times daily.   No facility-administered encounter medications on file as of 08/25/2022.    Review of Systems   Immunization History  Administered Date(s) Administered   Influenza, High Dose Seasonal PF 04/05/2017   Influenza-Unspecified 02/01/2022   MODERNA COVID-19 SARS-COV-2 PEDS BIVALENT BOOSTER 6Y-11Y 04/29/2019, 05/27/2019, 02/25/2022   Moderna SARS-COV2 Booster Vaccination 09/04/2020   Pfizer Covid-19 Vaccine Bivalent Booster 35yrs & up 02/28/2020, 01/08/2021   Pneumococcal Conjugate-13 03/07/2018   Pneumococcal Polysaccharide-23 03/11/2019   Tdap 09/14/2010   Pertinent  Health Maintenance Due  Topic Date Due   DEXA SCAN  Never done   INFLUENZA VACCINE  11/17/2022      06/10/2022    2:55 PM  Fall Risk  Falls in the past year? 0  Was there an injury with Fall? 0  Fall Risk Category Calculator 0  Patient at Risk for Falls Due to No Fall Risks  Fall risk Follow up Falls evaluation completed   Functional Status Survey:    Vitals:   08/25/22 1242  BP: 111/74  Pulse: 83  Resp: 20  Temp: 97.6 F (36.4 C)  SpO2: 92%  Weight: 144 lb 9.6 oz (65.6 kg)  Height: 5\' 3"  (1.6 m)   Body mass index is 25.61  kg/m.  Wt Readings from Last 3 Encounters:  08/25/22 144 lb 9.6 oz (65.6 kg)  06/29/22 148 lb (67.1 kg)  06/27/22 148 lb (67.1 kg)    Physical Exam Constitutional:      General: She is not in acute distress.    Appearance: She is well-developed. She is not diaphoretic.  HENT:     Head: Normocephalic and atraumatic.     Mouth/Throat:     Pharynx: No oropharyngeal exudate.  Eyes:     Conjunctiva/sclera: Conjunctivae normal.     Pupils: Pupils are equal, round, and reactive to light.  Cardiovascular:     Rate and Rhythm: Normal rate and regular rhythm.     Heart sounds: Normal heart sounds.  Pulmonary:     Effort: Pulmonary effort  is normal.     Breath sounds: Normal breath sounds.  Abdominal:     General: Bowel sounds are normal.     Palpations: Abdomen is soft.  Musculoskeletal:     Cervical back: Normal range of motion and neck supple.     Right lower leg: No edema.     Left lower leg: No edema.  Skin:    General: Skin is warm and dry.  Neurological:     Mental Status: She is alert. Mental status is at baseline. She is disoriented.     Motor: Weakness present.     Gait: Gait abnormal.  Psychiatric:        Mood and Affect: Mood normal.        Cognition and Memory: Cognition is impaired. Memory is impaired. She exhibits impaired recent memory.     Labs reviewed: Recent Labs    02/17/22 0000 06/13/22 0000  NA 138 136*  K 4.3 4.1  CL 105 103  CO2 26* 25*  BUN 16 20  CREATININE 1.1 1.0  CALCIUM 9.0 8.6*   Recent Labs    02/17/22 0000  AST 14  ALT 10  ALKPHOS 51  ALBUMIN 3.6   Recent Labs    02/17/22 0000 06/13/22 0000 08/18/22 0000  WBC 5.1 4.7 5.3  NEUTROABS 3,111.00 2,980.00  --   HGB 12.5 12.1 11.8*  HCT  --  36 36  PLT 145* 109* 125*   Lab Results  Component Value Date   TSH 2.04 06/13/2022   Lab Results  Component Value Date   HGBA1C 5.8 (H) 04/04/2017   Lab Results  Component Value Date   CHOL 138 02/17/2022   HDL 41 02/17/2022    LDLCALC 74 02/17/2022   TRIG 156 02/17/2022   CHOLHDL 6.3 04/04/2017    Significant Diagnostic Results in last 30 days:  No results found.  Assessment/Plan 1. Primary hypertension -Blood pressure well controlled, goal bp <140/90 Continue current medications and dietary modifications follow metabolic panel  2. Adult hypothyroidism -TSH at goal.   3. History of stroke with residual deficit -stable, she is a poor historian, continues supportive care from staff.   4. Combined hyperlipidemia LDL at goal on lipitor  5. Primary osteoarthritis involving multiple joints -stable, no reports of pain  6. Gastroesophageal reflux disease without esophagitis Continues on protonix  7. Chronic idiopathic constipation -stable on colace  8. Vitamin D deficiency -continues on vit D supplement  9. Weight loss -noted, continues on remeron and supplement     Loran Fleet K. Biagio Borg [facility] [phone]

## 2022-09-15 ENCOUNTER — Encounter: Payer: Self-pay | Admitting: Nurse Practitioner

## 2022-09-15 ENCOUNTER — Non-Acute Institutional Stay (SKILLED_NURSING_FACILITY): Payer: Medicare Other | Admitting: Nurse Practitioner

## 2022-09-15 DIAGNOSIS — R0781 Pleurodynia: Secondary | ICD-10-CM | POA: Diagnosis not present

## 2022-09-15 DIAGNOSIS — W19XXXA Unspecified fall, initial encounter: Secondary | ICD-10-CM

## 2022-09-15 DIAGNOSIS — S20211A Contusion of right front wall of thorax, initial encounter: Secondary | ICD-10-CM | POA: Diagnosis not present

## 2022-09-15 NOTE — Progress Notes (Signed)
Location:  Other [facility] Nursing Home Room Number: 417 A Place of Service:  SNF (31)  Taron Conrey K. Janyth Contes, NP   Patient Care Team: Earnestine Mealing, MD as PCP - General [facility] Medicine)  Extended Emergency Contact Information Primary Emergency Contact: Fayette Pho Address: [address]          [place], [place] [address] [place] of [place] Home Phone: [phone] Relation: Daughter Secondary Emergency Contact: Satira Sark [place] of [place] Home Phone: [phone] Mobile Phone: [phone] Relation: Son  Goals of care: Advanced Directive information    09/15/2022    1:49 PM  Advanced Directives  Does Patient Have a Medical Advance Directive? Yes  Type of Advance Directive Out of facility DNR (pink MOST or yellow form)  Does patient want to make changes to medical advance directive? No - Patient declined  Copy of Healthcare Power of Attorney in Chart? Yes - validated most recent copy scanned in chart (See row information)  Pre-existing out of facility DNR order (yellow form or pink MOST form) Yellow form placed in chart (order not valid for inpatient use);Pink MOST form placed in chart (order not valid for inpatient use)     Chief Complaint  Patient presents with   Acute Visit    Fall    HPI:  Pt is a 89 y.o. female seen today for an acute visit after fall.  Staff witness a fall in the dining room at breakfast. She hit her right side with a wheelchair causing a bruise and swelling in the right rib/back area.  Pt reports increase in pain to her back and states in radiates down her side. States it hurts to take a deep breath in and out.  She is able to stand and walk but reports her back hurts Does not have much of an appetite for lunch, reports she ate a big breakfast   Past Medical History:  Diagnosis Date   Cervicalgia    Depression    Essential hypertension    Hemorrhoids    History of MRSA infection    Hyperlipidemia     Hypertension    Hypothyroidism    IBS (irritable bowel syndrome)    Osteoarthritis of multiple joints    Restless leg    Senile osteoporosis    Shingles    Sleep apnea    Ulcerative colitis (HCC)    Urge incontinence    Past Surgical History:  Procedure Laterality Date   ABDOMINAL HYSTERECTOMY  1980   cataract surgery     CHOLECYSTECTOMY  1955   colon polyp removal  12/2012   ESOPHAGOGASTRODUODENOSCOPY  2014   makoplasty Left 06/2012   parotid gland removal  1984   VISCERAL ANGIOGRAPHY N/A 09/13/2017   Procedure: VISCERAL ANGIOGRAPHY;  Surgeon: Renford Dills, MD;  Location: [facility] INVASIVE CV LAB;  Service: Cardiovascular;  Laterality: N/A;    Allergies  Allergen Reactions   Codeine Sulfate Hives and Itching    Can take tramadol   Codeine Other (See Comments)    Outpatient Encounter Medications as of 09/15/2022  Medication Sig   acetaminophen (TYLENOL) 325 MG tablet Take 650 mg by mouth every 8 (eight) hours as needed for mild pain or moderate pain.   atorvastatin (LIPITOR) 40 MG tablet Take 1 tablet (40 mg total) by mouth daily at 6 PM.   Calcium Carbonate-Vit D-Min (CALCIUM 600+D PLUS MINERALS) 600-400 MG-UNIT TABS Take 1 tablet by mouth daily.   Cholecalciferol (D3-50) 50000 units capsule Take 50,000 Units by  mouth every 30 (thirty) days.   clopidogrel (PLAVIX) 75 MG tablet Take 75 mg by mouth daily.   cyanocobalamin 1000 MCG tablet Take 1,000 mcg by mouth daily.   docusate sodium (COLACE) 100 MG capsule Take 100 mg by mouth daily.   DULoxetine (CYMBALTA) 30 MG capsule Take 30 mg by mouth daily.   fluticasone (FLONASE) 50 MCG/ACT nasal spray Place 2 sprays into both nostrils daily.   levothyroxine (SYNTHROID, LEVOTHROID) 50 MCG tablet Take 50 mcg by mouth daily before breakfast.   losartan (COZAAR) 50 MG tablet Take 50 mg by mouth daily.    magnesium hydroxide (MILK OF MAGNESIA) 400 MG/5ML suspension Take 30 mLs by mouth daily as needed for mild constipation.    mirtazapine (REMERON) 7.5 MG tablet Take 7.5 mg by mouth at bedtime.   Multiple Vitamins-Minerals (MULTIVITAMIN ADULT PO) Take 1 tablet by mouth daily.    nystatin (MYCOSTATIN/NYSTOP) powder Apply 1 Application topically 2 (two) times daily. Topically to breasts as needed for rash.   ondansetron (ZOFRAN) 4 MG tablet Take 4 mg by mouth every 8 (eight) hours as needed for nausea or vomiting. Give Two tablets by mouth every 8 hours as needed for nausea and vomiting.   pantoprazole (PROTONIX) 40 MG tablet Take 40 mg by mouth daily.   [DISCONTINUED] amoxicillin-clavulanate (AUGMENTIN) 875-125 MG tablet Take 1 tablet by mouth 2 (two) times daily.   [DISCONTINUED] Calcium Carbonate-Vitamin D (CALCIUM 500 + D) 500-125 MG-UNIT TABS Take 1 tablet by mouth daily.    No facility-administered encounter medications on file as of 09/15/2022.    Review of Systems  Constitutional:  Negative for activity change, fatigue and fever.  Respiratory:  Negative for cough and shortness of breath.   Musculoskeletal:  Positive for back pain and myalgias.  Neurological:  Negative for dizziness.    Immunization History  Administered Date(s) Administered   Influenza, High Dose Seasonal PF 04/05/2017   Influenza-Unspecified 02/01/2022   MODERNA COVID-19 SARS-COV-2 PEDS BIVALENT BOOSTER 6Y-11Y 04/29/2019, 05/27/2019, 02/25/2022   Moderna SARS-COV2 Booster Vaccination 09/04/2020   Pfizer Covid-19 Vaccine Bivalent Booster 18yrs & up 02/28/2020, 01/08/2021   Pneumococcal Conjugate-13 03/07/2018   Pneumococcal Polysaccharide-23 03/11/2019   Tdap 09/14/2010   Pertinent  Health Maintenance Due  Topic Date Due   DEXA SCAN  Never done   INFLUENZA VACCINE  11/17/2022      06/10/2022    2:55 PM  Fall Risk  Falls in the past year? 0  Was there an injury with Fall? 0  Fall Risk Category Calculator 0  Patient at Risk for Falls Due to No Fall Risks  Fall risk Follow up Falls evaluation completed   Functional Status  Survey:    Vitals:   09/15/22 1347  BP: 105/71  Pulse: 77  Weight: 144 lb 9.6 oz (65.6 kg)  Height: 5\' 3"  (1.6 m)   Body mass index is 25.61 kg/m. Physical Exam Constitutional:      General: She is not in acute distress.    Appearance: She is well-developed. She is not diaphoretic.  HENT:     Head: Normocephalic and atraumatic.     Mouth/Throat:     Pharynx: No oropharyngeal exudate.  Eyes:     Conjunctiva/sclera: Conjunctivae normal.     Pupils: Pupils are equal, round, and reactive to light.  Cardiovascular:     Rate and Rhythm: Normal rate and regular rhythm.     Heart sounds: Normal heart sounds.  Pulmonary:     Effort: Pulmonary effort  is normal.     Breath sounds: Normal breath sounds.  Abdominal:     General: Bowel sounds are normal.     Palpations: Abdomen is soft.  Musculoskeletal:     Cervical back: Normal range of motion and neck supple.       Back:     Right lower leg: No edema.     Left lower leg: No edema.     Comments: Tenderness, swelling and bruising to right mid back after fall.  No pain to lumbar spine or right hip/leg   Skin:    General: Skin is warm and dry.  Neurological:     Mental Status: She is alert.  Psychiatric:        Mood and Affect: Mood normal.     Labs reviewed: Recent Labs    02/17/22 0000 06/13/22 0000  NA 138 136*  K 4.3 4.1  CL 105 103  CO2 26* 25*  BUN 16 20  CREATININE 1.1 1.0  CALCIUM 9.0 8.6*   Recent Labs    02/17/22 0000  AST 14  ALT 10  ALKPHOS 51  ALBUMIN 3.6   Recent Labs    02/17/22 0000 06/13/22 0000 08/18/22 0000  WBC 5.1 4.7 5.3  NEUTROABS 3,111.00 2,980.00  --   HGB 12.5 12.1 11.8*  HCT  --  36 36  PLT 145* 109* 125*   Lab Results  Component Value Date   TSH 2.04 06/13/2022   Lab Results  Component Value Date   HGBA1C 5.8 (H) 04/04/2017   Lab Results  Component Value Date   CHOL 138 02/17/2022   HDL 41 02/17/2022   LDLCALC 74 02/17/2022   TRIG 156 02/17/2022   CHOLHDL 6.3  04/04/2017    Significant Diagnostic Results in last 30 days:  No results found.  Assessment/Plan 1. Rib contusion, right, initial encounter -reports tylenol has helped with pain but still has pain at this time -increase tylenol to 1000 mg by mouth TID -to use ice to back as tolerated for the next 24 hours -will get chest xray at this time   2. Fall, initial encounter Increase fall precautions  Daylan Boggess K. Biagio Borg [facility] [phone]

## 2022-09-16 ENCOUNTER — Non-Acute Institutional Stay (SKILLED_NURSING_FACILITY): Payer: Medicare Other | Admitting: Student

## 2022-09-16 ENCOUNTER — Encounter: Payer: Self-pay | Admitting: Student

## 2022-09-16 DIAGNOSIS — S2241XA Multiple fractures of ribs, right side, initial encounter for closed fracture: Secondary | ICD-10-CM | POA: Diagnosis not present

## 2022-09-16 MED ORDER — OXYCODONE HCL 5 MG PO TABS: 2.5000 mg | ORAL_TABLET | Freq: Four times a day (QID) | ORAL | 0 refills | Status: AC | PRN

## 2022-09-16 NOTE — Progress Notes (Unsigned)
Location:  Other [facility].  Nursing Home Room Number: [facility] 417A Place of Service:  SNF [phone]) Provider:  Earnestine Mealing, MD  Patient Care Team: Earnestine Mealing, MD as PCP - General [facility] Medicine)  Extended Emergency Contact Information Primary Emergency Contact: Fayette Pho Address: [address]          [place], [place] [address] [place] of [place] Home Phone: [phone] Relation: Daughter Secondary Emergency Contact: Satira Sark [place] Home Phone: [phone] Mobile Phone: [phone] Relation: Son  Code Status:  DNR Goals of care: Advanced Directive information    09/16/2022    9:44 AM  Advanced Directives  Does Patient Have a Medical Advance Directive? Yes  Type of Advance Directive Out of facility DNR (pink MOST or yellow form)  Does patient want to make changes to medical advance directive? No - Patient declined     Chief Complaint  Patient presents with  . Acute Visit    Fall, Rib Fracture.     HPI:  Pt is a 89 y.o. female seen today for an acute visit for Fall, Rib Fracture.   She has been over here from [place] and loves the transition.   She had a fall yesterday and broke 3 ribs on the right side.   Her daughter kim is at bedside and she is discussing pain control.   She hasn't been more confused or disoriented at this itme. She is taking care well. Yesterday the pain was so severe she vomited 2x. One was after the fall and the second was when she was getting changed.   She has had episodes of unexpected smells can lead to vomiting. Gasoline and deisal can make her vomit  Past Medical History:  Diagnosis Date  . Cervicalgia   . Depression   . Essential hypertension   . Hemorrhoids   . History of MRSA infection   . Hyperlipidemia   . Hypertension   . Hypothyroidism   . IBS (irritable bowel syndrome)   . Osteoarthritis of multiple joints   . Restless leg   . Senile osteoporosis   .  Shingles   . Sleep apnea   . Ulcerative colitis (HCC)   . Urge incontinence    Past Surgical History:  Procedure Laterality Date  . ABDOMINAL HYSTERECTOMY  1980  . cataract surgery    . CHOLECYSTECTOMY  1955  . colon polyp removal  12/2012  . ESOPHAGOGASTRODUODENOSCOPY  2014  . makoplasty Left 06/2012  . parotid gland removal  1984  . VISCERAL ANGIOGRAPHY N/A 09/13/2017   Procedure: VISCERAL ANGIOGRAPHY;  Surgeon: Renford Dills, MD;  Location: [facility] INVASIVE CV LAB;  Service: Cardiovascular;  Laterality: N/A;    Allergies  Allergen Reactions  . Codeine Sulfate Hives and Itching    Can take tramadol  . Codeine Other (See Comments)    Outpatient Encounter Medications as of 09/16/2022  Medication Sig  . acetaminophen (TYLENOL) 500 MG tablet Take 1,000 mg by mouth every 8 (eight) hours as needed.  Marland Kitchen atorvastatin (LIPITOR) 40 MG tablet Take 1 tablet (40 mg total) by mouth daily at 6 PM.  . Calcium Carbonate-Vit D-Min (CALCIUM 600+D PLUS MINERALS) 600-400 MG-UNIT TABS Take 1 tablet by mouth daily.  . Cholecalciferol (D3-50) 50000 units capsule Take 50,000 Units by mouth every 30 (thirty) days.  . clopidogrel (PLAVIX) 75 MG tablet Take 75 mg by mouth daily.  . cyanocobalamin 1000 MCG tablet Take 1,000 mcg by mouth daily.  Marland Kitchen docusate sodium (COLACE)  100 MG capsule Take 100 mg by mouth daily.  . DULoxetine (CYMBALTA) 30 MG capsule Take 30 mg by mouth daily.  . fluticasone (FLONASE) 50 MCG/ACT nasal spray Place 2 sprays into both nostrils daily.  Marland Kitchen levothyroxine (SYNTHROID, LEVOTHROID) 50 MCG tablet Take 50 mcg by mouth daily before breakfast.  . losartan (COZAAR) 50 MG tablet Take 50 mg by mouth daily.   . magnesium hydroxide (MILK OF MAGNESIA) 400 MG/5ML suspension Take 30 mLs by mouth daily as needed for mild constipation.  . mirtazapine (REMERON) 7.5 MG tablet Take 7.5 mg by mouth at bedtime.  . Multiple Vitamins-Minerals (MULTIVITAMIN ADULT PO) Take 1 tablet by mouth daily.   Marland Kitchen  nystatin (MYCOSTATIN/NYSTOP) powder Apply 1 Application topically 2 (two) times daily. Topically to breasts as needed for rash.  . ondansetron (ZOFRAN) 4 MG tablet Take 4 mg by mouth every 8 (eight) hours as needed for nausea or vomiting. Give 2 tablets by mouth every 8 hours as needed  . pantoprazole (PROTONIX) 40 MG tablet Take 40 mg by mouth daily.  . [DISCONTINUED] acetaminophen (TYLENOL) 325 MG tablet Take 650 mg by mouth every 8 (eight) hours as needed for mild pain or moderate pain.   No facility-administered encounter medications on file as of 09/16/2022.    Review of Systems  Immunization History  Administered Date(s) Administered  . Influenza, High Dose Seasonal PF 04/05/2017  . Influenza-Unspecified 02/04/2020, 02/01/2021, 02/01/2022  . MODERNA COVID-19 SARS-COV-2 PEDS BIVALENT BOOSTER 6Y-11Y 04/29/2019, 05/27/2019, 02/25/2022  . Moderna SARS-COV2 Booster Vaccination 09/04/2020  . Research officer, trade union 1yrs & up 02/28/2020, 01/08/2021  . Pneumococcal Conjugate-13 03/07/2018  . Pneumococcal Polysaccharide-23 03/11/2019  . Tdap 09/14/2010   Pertinent  Health Maintenance Due  Topic Date Due  . DEXA SCAN  Never done  . INFLUENZA VACCINE  11/17/2022      06/10/2022    2:55 PM  Fall Risk  Falls in the past year? 0  Was there an injury with Fall? 0  Fall Risk Category Calculator 0  Patient at Risk for Falls Due to No Fall Risks  Fall risk Follow up Falls evaluation completed   Functional Status Survey:    Vitals:   09/16/22 0934  BP: 132/64  Pulse: 94  Resp: 20  Temp: (!) 97.1 F (36.2 C)  SpO2: 92%  Weight: 144 lb 9.6 oz (65.6 kg)  Height: 5\' 3"  (1.6 m)   Body mass index is 25.61 kg/m. Physical Exam Vitals reviewed.  Constitutional:      Appearance: Normal appearance.  Cardiovascular:     Rate and Rhythm: Normal rate.     Pulses: Normal pulses.  Pulmonary:     Comments: Decreased inspiration limited by pain Abdominal:     General:  Abdomen is flat.  Skin:    General: Skin is warm.  Neurological:     Mental Status: She is alert. Mental status is at baseline. She is disoriented.    Labs reviewed: Recent Labs    02/17/22 0000 06/13/22 0000  NA 138 136*  K 4.3 4.1  CL 105 103  CO2 26* 25*  BUN 16 20  CREATININE 1.1 1.0  CALCIUM 9.0 8.6*   Recent Labs    02/17/22 0000  AST 14  ALT 10  ALKPHOS 51  ALBUMIN 3.6   Recent Labs    02/17/22 0000 06/13/22 0000 08/18/22 0000  WBC 5.1 4.7 5.3  NEUTROABS 3,111.00 2,980.00  --   HGB 12.5 12.1 11.8*  HCT  --  36 36  PLT 145* 109* 125*   Lab Results  Component Value Date   TSH 2.04 06/13/2022   Lab Results  Component Value Date   HGBA1C 5.8 (H) 04/04/2017   Lab Results  Component Value Date   CHOL 138 02/17/2022   HDL 41 02/17/2022   LDLCALC 74 02/17/2022   TRIG 156 02/17/2022   CHOLHDL 6.3 04/04/2017    Significant Diagnostic Results in last 30 days:  No results found.  Assessment/Plan Closed fracture of multiple ribs of right side, initial encounter - Plan: oxyCODONE (ROXICODONE) 5 MG immediate release tablet Patient with a witnessed fall and suffered 4-6 right rib fractures. Pain control. Discussed concern that given patient's age, sedentary lifestyle, and facility living she is at high risk for pneumonia following multiple rib fracture. At this time will focus on pain control. Schedule oxycodone 2.5mg  q6 hours for 24 hours then PRN.   Family/ staff Communication: Selena Batten and nursing  Labs/tests ordered:  none  I

## 2022-09-17 ENCOUNTER — Encounter: Payer: Self-pay | Admitting: Student

## 2022-09-24 DIAGNOSIS — R2689 Other abnormalities of gait and mobility: Secondary | ICD-10-CM | POA: Diagnosis not present

## 2022-09-24 DIAGNOSIS — N183 Chronic kidney disease, stage 3 unspecified: Secondary | ICD-10-CM | POA: Diagnosis not present

## 2022-09-24 DIAGNOSIS — M6281 Muscle weakness (generalized): Secondary | ICD-10-CM | POA: Diagnosis not present

## 2022-09-24 DIAGNOSIS — Z741 Need for assistance with personal care: Secondary | ICD-10-CM | POA: Diagnosis not present

## 2022-09-24 DIAGNOSIS — M797 Fibromyalgia: Secondary | ICD-10-CM | POA: Diagnosis not present

## 2022-09-24 DIAGNOSIS — I69351 Hemiplegia and hemiparesis following cerebral infarction affecting right dominant side: Secondary | ICD-10-CM | POA: Diagnosis not present

## 2022-09-24 DIAGNOSIS — R278 Other lack of coordination: Secondary | ICD-10-CM | POA: Diagnosis not present

## 2022-09-24 DIAGNOSIS — I129 Hypertensive chronic kidney disease with stage 1 through stage 4 chronic kidney disease, or unspecified chronic kidney disease: Secondary | ICD-10-CM | POA: Diagnosis not present

## 2022-09-24 DIAGNOSIS — Z9181 History of falling: Secondary | ICD-10-CM | POA: Diagnosis not present

## 2022-09-24 DIAGNOSIS — I10 Essential (primary) hypertension: Secondary | ICD-10-CM | POA: Diagnosis not present

## 2022-09-26 DIAGNOSIS — I69351 Hemiplegia and hemiparesis following cerebral infarction affecting right dominant side: Secondary | ICD-10-CM | POA: Diagnosis not present

## 2022-09-26 DIAGNOSIS — N183 Chronic kidney disease, stage 3 unspecified: Secondary | ICD-10-CM | POA: Diagnosis not present

## 2022-09-26 DIAGNOSIS — M6281 Muscle weakness (generalized): Secondary | ICD-10-CM | POA: Diagnosis not present

## 2022-09-26 DIAGNOSIS — I129 Hypertensive chronic kidney disease with stage 1 through stage 4 chronic kidney disease, or unspecified chronic kidney disease: Secondary | ICD-10-CM | POA: Diagnosis not present

## 2022-09-26 DIAGNOSIS — M797 Fibromyalgia: Secondary | ICD-10-CM | POA: Diagnosis not present

## 2022-09-26 DIAGNOSIS — I10 Essential (primary) hypertension: Secondary | ICD-10-CM | POA: Diagnosis not present

## 2022-09-27 DIAGNOSIS — I129 Hypertensive chronic kidney disease with stage 1 through stage 4 chronic kidney disease, or unspecified chronic kidney disease: Secondary | ICD-10-CM | POA: Diagnosis not present

## 2022-09-27 DIAGNOSIS — I69351 Hemiplegia and hemiparesis following cerebral infarction affecting right dominant side: Secondary | ICD-10-CM | POA: Diagnosis not present

## 2022-09-27 DIAGNOSIS — M6281 Muscle weakness (generalized): Secondary | ICD-10-CM | POA: Diagnosis not present

## 2022-09-27 DIAGNOSIS — N183 Chronic kidney disease, stage 3 unspecified: Secondary | ICD-10-CM | POA: Diagnosis not present

## 2022-09-27 DIAGNOSIS — M797 Fibromyalgia: Secondary | ICD-10-CM | POA: Diagnosis not present

## 2022-09-27 DIAGNOSIS — I10 Essential (primary) hypertension: Secondary | ICD-10-CM | POA: Diagnosis not present

## 2022-09-28 DIAGNOSIS — I69351 Hemiplegia and hemiparesis following cerebral infarction affecting right dominant side: Secondary | ICD-10-CM | POA: Diagnosis not present

## 2022-09-28 DIAGNOSIS — M797 Fibromyalgia: Secondary | ICD-10-CM | POA: Diagnosis not present

## 2022-09-28 DIAGNOSIS — I10 Essential (primary) hypertension: Secondary | ICD-10-CM | POA: Diagnosis not present

## 2022-09-28 DIAGNOSIS — I129 Hypertensive chronic kidney disease with stage 1 through stage 4 chronic kidney disease, or unspecified chronic kidney disease: Secondary | ICD-10-CM | POA: Diagnosis not present

## 2022-09-28 DIAGNOSIS — N183 Chronic kidney disease, stage 3 unspecified: Secondary | ICD-10-CM | POA: Diagnosis not present

## 2022-09-28 DIAGNOSIS — M6281 Muscle weakness (generalized): Secondary | ICD-10-CM | POA: Diagnosis not present

## 2022-09-30 ENCOUNTER — Other Ambulatory Visit: Payer: Self-pay | Admitting: Student

## 2022-09-30 DIAGNOSIS — S20211A Contusion of right front wall of thorax, initial encounter: Secondary | ICD-10-CM

## 2022-09-30 MED ORDER — OXYCODONE HCL 5 MG PO TABS: 2.5000 mg | ORAL_TABLET | Freq: Three times a day (TID) | ORAL | 0 refills | Status: AC | PRN

## 2022-09-30 NOTE — Progress Notes (Signed)
Patient to receive 3d of pain control. Will not continue refilling pain medication beyond this point. Continue tylenol and lidoderm patches for pain control.

## 2022-10-04 DIAGNOSIS — M797 Fibromyalgia: Secondary | ICD-10-CM | POA: Diagnosis not present

## 2022-10-04 DIAGNOSIS — N183 Chronic kidney disease, stage 3 unspecified: Secondary | ICD-10-CM | POA: Diagnosis not present

## 2022-10-04 DIAGNOSIS — M6281 Muscle weakness (generalized): Secondary | ICD-10-CM | POA: Diagnosis not present

## 2022-10-04 DIAGNOSIS — I129 Hypertensive chronic kidney disease with stage 1 through stage 4 chronic kidney disease, or unspecified chronic kidney disease: Secondary | ICD-10-CM | POA: Diagnosis not present

## 2022-10-04 DIAGNOSIS — I69351 Hemiplegia and hemiparesis following cerebral infarction affecting right dominant side: Secondary | ICD-10-CM | POA: Diagnosis not present

## 2022-10-04 DIAGNOSIS — I10 Essential (primary) hypertension: Secondary | ICD-10-CM | POA: Diagnosis not present

## 2022-10-06 DIAGNOSIS — M6281 Muscle weakness (generalized): Secondary | ICD-10-CM | POA: Diagnosis not present

## 2022-10-06 DIAGNOSIS — I129 Hypertensive chronic kidney disease with stage 1 through stage 4 chronic kidney disease, or unspecified chronic kidney disease: Secondary | ICD-10-CM | POA: Diagnosis not present

## 2022-10-06 DIAGNOSIS — N183 Chronic kidney disease, stage 3 unspecified: Secondary | ICD-10-CM | POA: Diagnosis not present

## 2022-10-06 DIAGNOSIS — M797 Fibromyalgia: Secondary | ICD-10-CM | POA: Diagnosis not present

## 2022-10-06 DIAGNOSIS — I10 Essential (primary) hypertension: Secondary | ICD-10-CM | POA: Diagnosis not present

## 2022-10-06 DIAGNOSIS — I69351 Hemiplegia and hemiparesis following cerebral infarction affecting right dominant side: Secondary | ICD-10-CM | POA: Diagnosis not present

## 2022-10-07 DIAGNOSIS — I10 Essential (primary) hypertension: Secondary | ICD-10-CM | POA: Diagnosis not present

## 2022-10-07 DIAGNOSIS — N183 Chronic kidney disease, stage 3 unspecified: Secondary | ICD-10-CM | POA: Diagnosis not present

## 2022-10-07 DIAGNOSIS — M797 Fibromyalgia: Secondary | ICD-10-CM | POA: Diagnosis not present

## 2022-10-07 DIAGNOSIS — I69351 Hemiplegia and hemiparesis following cerebral infarction affecting right dominant side: Secondary | ICD-10-CM | POA: Diagnosis not present

## 2022-10-07 DIAGNOSIS — M6281 Muscle weakness (generalized): Secondary | ICD-10-CM | POA: Diagnosis not present

## 2022-10-07 DIAGNOSIS — I129 Hypertensive chronic kidney disease with stage 1 through stage 4 chronic kidney disease, or unspecified chronic kidney disease: Secondary | ICD-10-CM | POA: Diagnosis not present

## 2022-10-08 DIAGNOSIS — M6281 Muscle weakness (generalized): Secondary | ICD-10-CM | POA: Diagnosis not present

## 2022-10-08 DIAGNOSIS — M797 Fibromyalgia: Secondary | ICD-10-CM | POA: Diagnosis not present

## 2022-10-08 DIAGNOSIS — I69351 Hemiplegia and hemiparesis following cerebral infarction affecting right dominant side: Secondary | ICD-10-CM | POA: Diagnosis not present

## 2022-10-08 DIAGNOSIS — N183 Chronic kidney disease, stage 3 unspecified: Secondary | ICD-10-CM | POA: Diagnosis not present

## 2022-10-08 DIAGNOSIS — I10 Essential (primary) hypertension: Secondary | ICD-10-CM | POA: Diagnosis not present

## 2022-10-08 DIAGNOSIS — I129 Hypertensive chronic kidney disease with stage 1 through stage 4 chronic kidney disease, or unspecified chronic kidney disease: Secondary | ICD-10-CM | POA: Diagnosis not present

## 2022-10-10 DIAGNOSIS — N183 Chronic kidney disease, stage 3 unspecified: Secondary | ICD-10-CM | POA: Diagnosis not present

## 2022-10-10 DIAGNOSIS — I10 Essential (primary) hypertension: Secondary | ICD-10-CM | POA: Diagnosis not present

## 2022-10-10 DIAGNOSIS — M6281 Muscle weakness (generalized): Secondary | ICD-10-CM | POA: Diagnosis not present

## 2022-10-10 DIAGNOSIS — I129 Hypertensive chronic kidney disease with stage 1 through stage 4 chronic kidney disease, or unspecified chronic kidney disease: Secondary | ICD-10-CM | POA: Diagnosis not present

## 2022-10-10 DIAGNOSIS — I69351 Hemiplegia and hemiparesis following cerebral infarction affecting right dominant side: Secondary | ICD-10-CM | POA: Diagnosis not present

## 2022-10-10 DIAGNOSIS — M797 Fibromyalgia: Secondary | ICD-10-CM | POA: Diagnosis not present

## 2022-10-11 DIAGNOSIS — N183 Chronic kidney disease, stage 3 unspecified: Secondary | ICD-10-CM | POA: Diagnosis not present

## 2022-10-11 DIAGNOSIS — I129 Hypertensive chronic kidney disease with stage 1 through stage 4 chronic kidney disease, or unspecified chronic kidney disease: Secondary | ICD-10-CM | POA: Diagnosis not present

## 2022-10-11 DIAGNOSIS — M797 Fibromyalgia: Secondary | ICD-10-CM | POA: Diagnosis not present

## 2022-10-11 DIAGNOSIS — I69351 Hemiplegia and hemiparesis following cerebral infarction affecting right dominant side: Secondary | ICD-10-CM | POA: Diagnosis not present

## 2022-10-11 DIAGNOSIS — M6281 Muscle weakness (generalized): Secondary | ICD-10-CM | POA: Diagnosis not present

## 2022-10-11 DIAGNOSIS — I10 Essential (primary) hypertension: Secondary | ICD-10-CM | POA: Diagnosis not present

## 2022-10-12 DIAGNOSIS — M6281 Muscle weakness (generalized): Secondary | ICD-10-CM | POA: Diagnosis not present

## 2022-10-12 DIAGNOSIS — I69351 Hemiplegia and hemiparesis following cerebral infarction affecting right dominant side: Secondary | ICD-10-CM | POA: Diagnosis not present

## 2022-10-12 DIAGNOSIS — I129 Hypertensive chronic kidney disease with stage 1 through stage 4 chronic kidney disease, or unspecified chronic kidney disease: Secondary | ICD-10-CM | POA: Diagnosis not present

## 2022-10-12 DIAGNOSIS — N183 Chronic kidney disease, stage 3 unspecified: Secondary | ICD-10-CM | POA: Diagnosis not present

## 2022-10-12 DIAGNOSIS — I10 Essential (primary) hypertension: Secondary | ICD-10-CM | POA: Diagnosis not present

## 2022-10-12 DIAGNOSIS — M797 Fibromyalgia: Secondary | ICD-10-CM | POA: Diagnosis not present

## 2022-10-14 DIAGNOSIS — I69351 Hemiplegia and hemiparesis following cerebral infarction affecting right dominant side: Secondary | ICD-10-CM | POA: Diagnosis not present

## 2022-10-14 DIAGNOSIS — M6281 Muscle weakness (generalized): Secondary | ICD-10-CM | POA: Diagnosis not present

## 2022-10-14 DIAGNOSIS — M797 Fibromyalgia: Secondary | ICD-10-CM | POA: Diagnosis not present

## 2022-10-14 DIAGNOSIS — I129 Hypertensive chronic kidney disease with stage 1 through stage 4 chronic kidney disease, or unspecified chronic kidney disease: Secondary | ICD-10-CM | POA: Diagnosis not present

## 2022-10-14 DIAGNOSIS — N183 Chronic kidney disease, stage 3 unspecified: Secondary | ICD-10-CM | POA: Diagnosis not present

## 2022-10-14 DIAGNOSIS — I10 Essential (primary) hypertension: Secondary | ICD-10-CM | POA: Diagnosis not present

## 2022-10-15 DIAGNOSIS — M6281 Muscle weakness (generalized): Secondary | ICD-10-CM | POA: Diagnosis not present

## 2022-10-15 DIAGNOSIS — I69351 Hemiplegia and hemiparesis following cerebral infarction affecting right dominant side: Secondary | ICD-10-CM | POA: Diagnosis not present

## 2022-10-15 DIAGNOSIS — M797 Fibromyalgia: Secondary | ICD-10-CM | POA: Diagnosis not present

## 2022-10-15 DIAGNOSIS — I10 Essential (primary) hypertension: Secondary | ICD-10-CM | POA: Diagnosis not present

## 2022-10-15 DIAGNOSIS — N183 Chronic kidney disease, stage 3 unspecified: Secondary | ICD-10-CM | POA: Diagnosis not present

## 2022-10-15 DIAGNOSIS — I129 Hypertensive chronic kidney disease with stage 1 through stage 4 chronic kidney disease, or unspecified chronic kidney disease: Secondary | ICD-10-CM | POA: Diagnosis not present

## 2022-10-17 DIAGNOSIS — M797 Fibromyalgia: Secondary | ICD-10-CM | POA: Diagnosis not present

## 2022-10-17 DIAGNOSIS — R2689 Other abnormalities of gait and mobility: Secondary | ICD-10-CM | POA: Diagnosis not present

## 2022-10-17 DIAGNOSIS — I10 Essential (primary) hypertension: Secondary | ICD-10-CM | POA: Diagnosis not present

## 2022-10-17 DIAGNOSIS — M6281 Muscle weakness (generalized): Secondary | ICD-10-CM | POA: Diagnosis not present

## 2022-10-17 DIAGNOSIS — R278 Other lack of coordination: Secondary | ICD-10-CM | POA: Diagnosis not present

## 2022-10-17 DIAGNOSIS — I129 Hypertensive chronic kidney disease with stage 1 through stage 4 chronic kidney disease, or unspecified chronic kidney disease: Secondary | ICD-10-CM | POA: Diagnosis not present

## 2022-10-17 DIAGNOSIS — Z9181 History of falling: Secondary | ICD-10-CM | POA: Diagnosis not present

## 2022-10-17 DIAGNOSIS — I69351 Hemiplegia and hemiparesis following cerebral infarction affecting right dominant side: Secondary | ICD-10-CM | POA: Diagnosis not present

## 2022-10-17 DIAGNOSIS — Z741 Need for assistance with personal care: Secondary | ICD-10-CM | POA: Diagnosis not present

## 2022-10-17 DIAGNOSIS — N183 Chronic kidney disease, stage 3 unspecified: Secondary | ICD-10-CM | POA: Diagnosis not present

## 2022-10-18 DIAGNOSIS — N183 Chronic kidney disease, stage 3 unspecified: Secondary | ICD-10-CM | POA: Diagnosis not present

## 2022-10-18 DIAGNOSIS — I129 Hypertensive chronic kidney disease with stage 1 through stage 4 chronic kidney disease, or unspecified chronic kidney disease: Secondary | ICD-10-CM | POA: Diagnosis not present

## 2022-10-18 DIAGNOSIS — I10 Essential (primary) hypertension: Secondary | ICD-10-CM | POA: Diagnosis not present

## 2022-10-18 DIAGNOSIS — M797 Fibromyalgia: Secondary | ICD-10-CM | POA: Diagnosis not present

## 2022-10-18 DIAGNOSIS — M6281 Muscle weakness (generalized): Secondary | ICD-10-CM | POA: Diagnosis not present

## 2022-10-18 DIAGNOSIS — I69351 Hemiplegia and hemiparesis following cerebral infarction affecting right dominant side: Secondary | ICD-10-CM | POA: Diagnosis not present

## 2022-10-19 DIAGNOSIS — I10 Essential (primary) hypertension: Secondary | ICD-10-CM | POA: Diagnosis not present

## 2022-10-19 DIAGNOSIS — I129 Hypertensive chronic kidney disease with stage 1 through stage 4 chronic kidney disease, or unspecified chronic kidney disease: Secondary | ICD-10-CM | POA: Diagnosis not present

## 2022-10-19 DIAGNOSIS — I69351 Hemiplegia and hemiparesis following cerebral infarction affecting right dominant side: Secondary | ICD-10-CM | POA: Diagnosis not present

## 2022-10-19 DIAGNOSIS — M797 Fibromyalgia: Secondary | ICD-10-CM | POA: Diagnosis not present

## 2022-10-19 DIAGNOSIS — N183 Chronic kidney disease, stage 3 unspecified: Secondary | ICD-10-CM | POA: Diagnosis not present

## 2022-10-19 DIAGNOSIS — M6281 Muscle weakness (generalized): Secondary | ICD-10-CM | POA: Diagnosis not present

## 2022-10-21 DIAGNOSIS — I69351 Hemiplegia and hemiparesis following cerebral infarction affecting right dominant side: Secondary | ICD-10-CM | POA: Diagnosis not present

## 2022-10-21 DIAGNOSIS — M797 Fibromyalgia: Secondary | ICD-10-CM | POA: Diagnosis not present

## 2022-10-21 DIAGNOSIS — I129 Hypertensive chronic kidney disease with stage 1 through stage 4 chronic kidney disease, or unspecified chronic kidney disease: Secondary | ICD-10-CM | POA: Diagnosis not present

## 2022-10-21 DIAGNOSIS — N183 Chronic kidney disease, stage 3 unspecified: Secondary | ICD-10-CM | POA: Diagnosis not present

## 2022-10-21 DIAGNOSIS — M6281 Muscle weakness (generalized): Secondary | ICD-10-CM | POA: Diagnosis not present

## 2022-10-21 DIAGNOSIS — I10 Essential (primary) hypertension: Secondary | ICD-10-CM | POA: Diagnosis not present

## 2022-10-24 ENCOUNTER — Non-Acute Institutional Stay (SKILLED_NURSING_FACILITY): Payer: Medicare Other | Admitting: Student

## 2022-10-24 DIAGNOSIS — E782 Mixed hyperlipidemia: Secondary | ICD-10-CM

## 2022-10-24 DIAGNOSIS — I129 Hypertensive chronic kidney disease with stage 1 through stage 4 chronic kidney disease, or unspecified chronic kidney disease: Secondary | ICD-10-CM | POA: Diagnosis not present

## 2022-10-24 DIAGNOSIS — K219 Gastro-esophageal reflux disease without esophagitis: Secondary | ICD-10-CM

## 2022-10-24 DIAGNOSIS — N183 Chronic kidney disease, stage 3 unspecified: Secondary | ICD-10-CM | POA: Diagnosis not present

## 2022-10-24 DIAGNOSIS — E559 Vitamin D deficiency, unspecified: Secondary | ICD-10-CM | POA: Diagnosis not present

## 2022-10-24 DIAGNOSIS — I69351 Hemiplegia and hemiparesis following cerebral infarction affecting right dominant side: Secondary | ICD-10-CM | POA: Diagnosis not present

## 2022-10-24 DIAGNOSIS — E039 Hypothyroidism, unspecified: Secondary | ICD-10-CM

## 2022-10-24 DIAGNOSIS — D696 Thrombocytopenia, unspecified: Secondary | ICD-10-CM

## 2022-10-24 DIAGNOSIS — M797 Fibromyalgia: Secondary | ICD-10-CM | POA: Diagnosis not present

## 2022-10-24 DIAGNOSIS — I10 Essential (primary) hypertension: Secondary | ICD-10-CM | POA: Diagnosis not present

## 2022-10-24 DIAGNOSIS — I69390 Apraxia following cerebral infarction: Secondary | ICD-10-CM | POA: Diagnosis not present

## 2022-10-24 DIAGNOSIS — M6281 Muscle weakness (generalized): Secondary | ICD-10-CM | POA: Diagnosis not present

## 2022-10-24 NOTE — Progress Notes (Signed)
Location:  Other Nursing Home Room Number: [facility] 417A Place of Service:  SNF [phone]) Provider:  Ander Gaster, Benetta Spar, MD  Patient Care Team: Earnestine Mealing, MD as PCP - General [facility] Medicine)  Extended Emergency Contact Information Primary Emergency Contact: Fayette Pho Address: [address]          [place], [place] [address] [place] of [place] Home Phone: [phone] Relation: Daughter Secondary Emergency Contact: Satira Sark [place] Home Phone: [phone] Mobile Phone: [phone] Relation: Son  Code Status:  DNR Goals of care: Advanced Directive information    09/16/2022    9:44 AM  Advanced Directives  Does Patient Have a Medical Advance Directive? Yes  Type of Advance Directive Out of facility DNR (pink MOST or yellow form)  Does patient want to make changes to medical advance directive? No - Patient declined     Chief Complaint  Patient presents with  . Medical Management of Chronic Conditions    HPI:  Pt is a 89 y.o. female seen today for medical management of chronic diseases.     Past Medical History:  Diagnosis Date  . Cervicalgia   . Depression   . Essential hypertension   . Hemorrhoids   . History of MRSA infection   . Hyperlipidemia   . Hypertension   . Hypothyroidism   . IBS (irritable bowel syndrome)   . Osteoarthritis of multiple joints   . Restless leg   . Senile osteoporosis   . Shingles   . Sleep apnea   . Ulcerative colitis (HCC)   . Urge incontinence    Past Surgical History:  Procedure Laterality Date  . ABDOMINAL HYSTERECTOMY  1980  . cataract surgery    . CHOLECYSTECTOMY  1955  . colon polyp removal  12/2012  . ESOPHAGOGASTRODUODENOSCOPY  2014  . makoplasty Left 06/2012  . parotid gland removal  1984  . VISCERAL ANGIOGRAPHY N/A 09/13/2017   Procedure: VISCERAL ANGIOGRAPHY;  Surgeon: Renford Dills, MD;  Location: [facility] INVASIVE CV LAB;  Service: Cardiovascular;   Laterality: N/A;    Allergies  Allergen Reactions  . Codeine Sulfate Hives and Itching    Can take tramadol  . Codeine Other (See Comments)    Outpatient Encounter Medications as of 10/24/2022  Medication Sig  . acetaminophen (TYLENOL) 500 MG tablet Take 1,000 mg by mouth every 8 (eight) hours as needed.  Marland Kitchen atorvastatin (LIPITOR) 40 MG tablet Take 1 tablet (40 mg total) by mouth daily at 6 PM.  . Calcium Carbonate-Vit D-Min (CALCIUM 600+D PLUS MINERALS) 600-400 MG-UNIT TABS Take 1 tablet by mouth daily.  . Cholecalciferol (D3-50) 50000 units capsule Take 50,000 Units by mouth every 30 (thirty) days.  . clopidogrel (PLAVIX) 75 MG tablet Take 75 mg by mouth daily.  . cyanocobalamin 1000 MCG tablet Take 1,000 mcg by mouth daily.  Marland Kitchen docusate sodium (COLACE) 100 MG capsule Take 100 mg by mouth daily.  . DULoxetine (CYMBALTA) 30 MG capsule Take 30 mg by mouth daily.  . fluticasone (FLONASE) 50 MCG/ACT nasal spray Place 2 sprays into both nostrils daily.  Marland Kitchen levothyroxine (SYNTHROID, LEVOTHROID) 50 MCG tablet Take 50 mcg by mouth daily before breakfast.  . losartan (COZAAR) 50 MG tablet Take 50 mg by mouth daily.   . magnesium hydroxide (MILK OF MAGNESIA) 400 MG/5ML suspension Take 30 mLs by mouth daily as needed for mild constipation.  . mirtazapine (REMERON) 7.5 MG tablet Take 7.5 mg by mouth at bedtime.  . Multiple Vitamins-Minerals (  MULTIVITAMIN ADULT PO) Take 1 tablet by mouth daily.   Marland Kitchen nystatin (MYCOSTATIN/NYSTOP) powder Apply 1 Application topically 2 (two) times daily. Topically to breasts as needed for rash.  . ondansetron (ZOFRAN) 4 MG tablet Take 4 mg by mouth every 8 (eight) hours as needed for nausea or vomiting. Give 2 tablets by mouth every 8 hours as needed  . pantoprazole (PROTONIX) 40 MG tablet Take 40 mg by mouth daily.   No facility-administered encounter medications on file as of 10/24/2022.    Review of Systems  Immunization History  Administered Date(s) Administered   . Influenza, High Dose Seasonal PF 04/05/2017  . Influenza-Unspecified 02/04/2020, 02/01/2021, 02/01/2022  . MODERNA COVID-19 SARS-COV-2 PEDS BIVALENT BOOSTER 6Y-11Y 04/29/2019, 05/27/2019, 02/25/2022  . Moderna SARS-COV2 Booster Vaccination 09/04/2020  . Research officer, trade union 44yrs & up 02/28/2020, 01/08/2021  . Pneumococcal Conjugate-13 03/07/2018  . Pneumococcal Polysaccharide-23 03/11/2019  . Tdap 09/14/2010   Pertinent  Health Maintenance Due  Topic Date Due  . DEXA SCAN  Never done  . INFLUENZA VACCINE  11/17/2022      06/10/2022    2:55 PM  Fall Risk  Falls in the past year? 0  Was there an injury with Fall? 0  Fall Risk Category Calculator 0  Patient at Risk for Falls Due to No Fall Risks  Fall risk Follow up Falls evaluation completed   Functional Status Survey:    Vitals:   10/24/22 1027  BP: 139/83  Pulse: 78  Resp: 16  Temp: (!) 97.4 F (36.3 C)   There is no height or weight on file to calculate BMI. Physical Exam  Labs reviewed: Recent Labs    02/17/22 0000 06/13/22 0000  NA 138 136*  K 4.3 4.1  CL 105 103  CO2 26* 25*  BUN 16 20  CREATININE 1.1 1.0  CALCIUM 9.0 8.6*   Recent Labs    02/17/22 0000  AST 14  ALT 10  ALKPHOS 51  ALBUMIN 3.6   Recent Labs    02/17/22 0000 06/13/22 0000 08/18/22 0000  WBC 5.1 4.7 5.3  NEUTROABS 3,111.00 2,980.00  --   HGB 12.5 12.1 11.8*  HCT  --  36 36  PLT 145* 109* 125*   Lab Results  Component Value Date   TSH 2.04 06/13/2022   Lab Results  Component Value Date   HGBA1C 5.8 (H) 04/04/2017   Lab Results  Component Value Date   CHOL 138 02/17/2022   HDL 41 02/17/2022   LDLCALC 74 02/17/2022   TRIG 156 02/17/2022   CHOLHDL 6.3 04/04/2017    Significant Diagnostic Results in last 30 days:  No results found.  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

## 2022-10-26 DIAGNOSIS — I129 Hypertensive chronic kidney disease with stage 1 through stage 4 chronic kidney disease, or unspecified chronic kidney disease: Secondary | ICD-10-CM | POA: Diagnosis not present

## 2022-10-26 DIAGNOSIS — M797 Fibromyalgia: Secondary | ICD-10-CM | POA: Diagnosis not present

## 2022-10-26 DIAGNOSIS — I69351 Hemiplegia and hemiparesis following cerebral infarction affecting right dominant side: Secondary | ICD-10-CM | POA: Diagnosis not present

## 2022-10-26 DIAGNOSIS — I10 Essential (primary) hypertension: Secondary | ICD-10-CM | POA: Diagnosis not present

## 2022-10-26 DIAGNOSIS — M6281 Muscle weakness (generalized): Secondary | ICD-10-CM | POA: Diagnosis not present

## 2022-10-26 DIAGNOSIS — N183 Chronic kidney disease, stage 3 unspecified: Secondary | ICD-10-CM | POA: Diagnosis not present

## 2022-10-27 DIAGNOSIS — M797 Fibromyalgia: Secondary | ICD-10-CM | POA: Diagnosis not present

## 2022-10-27 DIAGNOSIS — M6281 Muscle weakness (generalized): Secondary | ICD-10-CM | POA: Diagnosis not present

## 2022-10-27 DIAGNOSIS — I10 Essential (primary) hypertension: Secondary | ICD-10-CM | POA: Diagnosis not present

## 2022-10-27 DIAGNOSIS — I69351 Hemiplegia and hemiparesis following cerebral infarction affecting right dominant side: Secondary | ICD-10-CM | POA: Diagnosis not present

## 2022-10-27 DIAGNOSIS — I129 Hypertensive chronic kidney disease with stage 1 through stage 4 chronic kidney disease, or unspecified chronic kidney disease: Secondary | ICD-10-CM | POA: Diagnosis not present

## 2022-10-27 DIAGNOSIS — N183 Chronic kidney disease, stage 3 unspecified: Secondary | ICD-10-CM | POA: Diagnosis not present

## 2022-10-27 LAB — CBC: RBC: 4.3 (ref 3.87–5.11)

## 2022-10-27 LAB — BASIC METABOLIC PANEL
BUN: 13 (ref 4–21)
CO2: 27 — AB (ref 13–22)
Chloride: 105 (ref 99–108)
Creatinine: 1 (ref 0.5–1.1)
Glucose: 74
Potassium: 3.9 mEq/L (ref 3.5–5.1)
Sodium: 139 (ref 137–147)

## 2022-10-27 LAB — COMPREHENSIVE METABOLIC PANEL: Calcium: 8.7 (ref 8.7–10.7)

## 2022-10-27 LAB — CBC AND DIFFERENTIAL
HCT: 38 (ref 36–46)
Hemoglobin: 12.4 (ref 12.0–16.0)
Neutrophils Absolute: 4171
Platelets: 147 10*3/uL — AB (ref 150–400)
WBC: 6.3

## 2022-10-28 DIAGNOSIS — M6281 Muscle weakness (generalized): Secondary | ICD-10-CM | POA: Diagnosis not present

## 2022-10-28 DIAGNOSIS — I69351 Hemiplegia and hemiparesis following cerebral infarction affecting right dominant side: Secondary | ICD-10-CM | POA: Diagnosis not present

## 2022-10-28 DIAGNOSIS — I10 Essential (primary) hypertension: Secondary | ICD-10-CM | POA: Diagnosis not present

## 2022-10-28 DIAGNOSIS — I129 Hypertensive chronic kidney disease with stage 1 through stage 4 chronic kidney disease, or unspecified chronic kidney disease: Secondary | ICD-10-CM | POA: Diagnosis not present

## 2022-10-28 DIAGNOSIS — N183 Chronic kidney disease, stage 3 unspecified: Secondary | ICD-10-CM | POA: Diagnosis not present

## 2022-10-28 DIAGNOSIS — M797 Fibromyalgia: Secondary | ICD-10-CM | POA: Diagnosis not present

## 2022-10-31 DIAGNOSIS — I10 Essential (primary) hypertension: Secondary | ICD-10-CM | POA: Diagnosis not present

## 2022-10-31 DIAGNOSIS — M6281 Muscle weakness (generalized): Secondary | ICD-10-CM | POA: Diagnosis not present

## 2022-10-31 DIAGNOSIS — N183 Chronic kidney disease, stage 3 unspecified: Secondary | ICD-10-CM | POA: Diagnosis not present

## 2022-10-31 DIAGNOSIS — I129 Hypertensive chronic kidney disease with stage 1 through stage 4 chronic kidney disease, or unspecified chronic kidney disease: Secondary | ICD-10-CM | POA: Diagnosis not present

## 2022-10-31 DIAGNOSIS — M797 Fibromyalgia: Secondary | ICD-10-CM | POA: Diagnosis not present

## 2022-10-31 DIAGNOSIS — I69351 Hemiplegia and hemiparesis following cerebral infarction affecting right dominant side: Secondary | ICD-10-CM | POA: Diagnosis not present

## 2022-11-01 DIAGNOSIS — I129 Hypertensive chronic kidney disease with stage 1 through stage 4 chronic kidney disease, or unspecified chronic kidney disease: Secondary | ICD-10-CM | POA: Diagnosis not present

## 2022-11-01 DIAGNOSIS — N183 Chronic kidney disease, stage 3 unspecified: Secondary | ICD-10-CM | POA: Diagnosis not present

## 2022-11-01 DIAGNOSIS — M797 Fibromyalgia: Secondary | ICD-10-CM | POA: Diagnosis not present

## 2022-11-01 DIAGNOSIS — I10 Essential (primary) hypertension: Secondary | ICD-10-CM | POA: Diagnosis not present

## 2022-11-01 DIAGNOSIS — I69351 Hemiplegia and hemiparesis following cerebral infarction affecting right dominant side: Secondary | ICD-10-CM | POA: Diagnosis not present

## 2022-11-01 DIAGNOSIS — M6281 Muscle weakness (generalized): Secondary | ICD-10-CM | POA: Diagnosis not present

## 2022-11-02 DIAGNOSIS — M6281 Muscle weakness (generalized): Secondary | ICD-10-CM | POA: Diagnosis not present

## 2022-11-02 DIAGNOSIS — N183 Chronic kidney disease, stage 3 unspecified: Secondary | ICD-10-CM | POA: Diagnosis not present

## 2022-11-02 DIAGNOSIS — I10 Essential (primary) hypertension: Secondary | ICD-10-CM | POA: Diagnosis not present

## 2022-11-02 DIAGNOSIS — M797 Fibromyalgia: Secondary | ICD-10-CM | POA: Diagnosis not present

## 2022-11-02 DIAGNOSIS — I129 Hypertensive chronic kidney disease with stage 1 through stage 4 chronic kidney disease, or unspecified chronic kidney disease: Secondary | ICD-10-CM | POA: Diagnosis not present

## 2022-11-02 DIAGNOSIS — I69351 Hemiplegia and hemiparesis following cerebral infarction affecting right dominant side: Secondary | ICD-10-CM | POA: Diagnosis not present

## 2022-11-03 ENCOUNTER — Encounter: Payer: Self-pay | Admitting: Adult Health

## 2022-11-03 ENCOUNTER — Non-Acute Institutional Stay (SKILLED_NURSING_FACILITY): Payer: Medicare Other | Admitting: Adult Health

## 2022-11-03 DIAGNOSIS — K625 Hemorrhage of anus and rectum: Secondary | ICD-10-CM | POA: Diagnosis not present

## 2022-11-03 DIAGNOSIS — N183 Chronic kidney disease, stage 3 unspecified: Secondary | ICD-10-CM | POA: Diagnosis not present

## 2022-11-03 DIAGNOSIS — I69351 Hemiplegia and hemiparesis following cerebral infarction affecting right dominant side: Secondary | ICD-10-CM | POA: Diagnosis not present

## 2022-11-03 DIAGNOSIS — I129 Hypertensive chronic kidney disease with stage 1 through stage 4 chronic kidney disease, or unspecified chronic kidney disease: Secondary | ICD-10-CM | POA: Diagnosis not present

## 2022-11-03 DIAGNOSIS — Z8673 Personal history of transient ischemic attack (TIA), and cerebral infarction without residual deficits: Secondary | ICD-10-CM

## 2022-11-03 DIAGNOSIS — M797 Fibromyalgia: Secondary | ICD-10-CM | POA: Diagnosis not present

## 2022-11-03 DIAGNOSIS — I10 Essential (primary) hypertension: Secondary | ICD-10-CM | POA: Diagnosis not present

## 2022-11-03 DIAGNOSIS — D649 Anemia, unspecified: Secondary | ICD-10-CM | POA: Diagnosis not present

## 2022-11-03 DIAGNOSIS — M6281 Muscle weakness (generalized): Secondary | ICD-10-CM | POA: Diagnosis not present

## 2022-11-03 MED ORDER — FAMOTIDINE 20 MG PO TABS: 20.0000 mg | ORAL_TABLET | Freq: Two times a day (BID) | ORAL | 0 refills | Status: DC

## 2022-11-03 MED ORDER — PANTOPRAZOLE SODIUM 40 MG PO TBEC: 40.0000 mg | DELAYED_RELEASE_TABLET | Freq: Two times a day (BID) | ORAL | 0 refills | Status: DC

## 2022-11-03 NOTE — Progress Notes (Unsigned)
Location:  Other [facility]) Nursing Home Room Number: 417 A Place of Service:  SNF (31) Provider:  Kenard Gower, DNP, FNP-BC  Patient Care Team: Earnestine Mealing, MD as PCP - General (Family Medicine)  Extended Emergency Contact Information Primary Emergency Contact: Fayette Pho Address: [address]          [place], [place] [address] [place] of [place] Home Phone: [phone] Relation: Daughter Secondary Emergency Contact: Satira Sark [place] of [place] Home Phone: [phone] Mobile Phone: [phone] Relation: Son  Code Status:  DNR  Goals of care: Advanced Directive information    11/03/2022   10:32 AM  Advanced Directives  Does Patient Have a Medical Advance Directive? Yes  Type of Advance Directive Out of facility DNR (pink MOST or yellow form)  Does patient want to make changes to medical advance directive? No - Patient declined  Pre-existing out of facility DNR order (yellow form or pink MOST form) Yellow form placed in chart (order not valid for inpatient use);Pink MOST form placed in chart (order not valid for inpatient use)     Chief Complaint  Patient presents with   Acute Visit    blood in underwear     HPI:  Pt is a 89 y.o. female seen today for an acute visit regarding blood on her diaper. She is a resident of [facility]. She was reported to have moderate amount of red blood on her diaper. This morning she was noted to have another moderate amount of red blood on her diaper. She was reported to have about 10 blood clots on the toilet bowl. BP 132/66, HR 76. She has expressive aphasia. She denies pain. She was reported to have eaten breakfast. She was noted to have rectal external hemmorhoids. She has history of GI bleed and ulcerative colitis.. She currently takes Famotidine 10 mg Q HS. She has order for do not hospitalized and do not resuscitate. She takes Plavix for history of CVA. Plavix was discontinued today  due to bleeding episode.                                                              Past Medical History:  Diagnosis Date   Cervicalgia    Depression    Essential hypertension    Hemorrhoids    History of MRSA infection    Hyperlipidemia    Hypertension    Hypothyroidism    IBS (irritable bowel syndrome)    Osteoarthritis of multiple joints    Restless leg    Senile osteoporosis    Shingles    Sleep apnea    Ulcerative colitis (HCC)    Urge incontinence    Past Surgical History:  Procedure Laterality Date   ABDOMINAL HYSTERECTOMY  1980   cataract surgery     CHOLECYSTECTOMY  1955   colon polyp removal  12/2012   ESOPHAGOGASTRODUODENOSCOPY  2014   makoplasty Left 06/2012   parotid gland removal  1984   VISCERAL ANGIOGRAPHY N/A 09/13/2017   Procedure: VISCERAL ANGIOGRAPHY;  Surgeon: Renford Dills, MD;  Location: [facility] INVASIVE CV LAB;  Service: Cardiovascular;  Laterality: N/A;    Allergies  Allergen Reactions   Codeine Sulfate Hives and Itching    Can take tramadol   Codeine Other (See Comments)  Outpatient Encounter Medications as of 11/03/2022  Medication Sig   acetaminophen (TYLENOL) 500 MG tablet Take 1,000 mg by mouth 3 (three) times daily.   atorvastatin (LIPITOR) 40 MG tablet Take 1 tablet (40 mg total) by mouth daily at 6 PM.   Calcium Carbonate-Vit D-Min (CALCIUM 600+D PLUS MINERALS) 600-400 MG-UNIT TABS Take 1 tablet by mouth daily.   Cholecalciferol (D3-50) 50000 units capsule Take 50,000 Units by mouth every 30 (thirty) days.   clopidogrel (PLAVIX) 75 MG tablet Take 75 mg by mouth daily.   cyanocobalamin 1000 MCG tablet Take 1,000 mcg by mouth daily.   docusate sodium (COLACE) 100 MG capsule Take 100 mg by mouth daily.   DULoxetine (CYMBALTA) 30 MG capsule Take 30 mg by mouth daily.   famotidine (PEPCID) 20 MG tablet Take 1 tablet (20 mg total) by mouth 2 (two) times daily.   fluticasone (FLONASE) 50 MCG/ACT nasal spray Place 2 sprays into both  nostrils daily.   levothyroxine (SYNTHROID, LEVOTHROID) 50 MCG tablet Take 50 mcg by mouth daily before breakfast.   lidocaine (ASPERCREME LIDOCAINE) 4 % Place 1 patch onto the skin daily. As needed for pain   losartan (COZAAR) 50 MG tablet Take 50 mg by mouth daily.    magnesium hydroxide (MILK OF MAGNESIA) 400 MG/5ML suspension Take 30 mLs by mouth every 12 (twelve) hours as needed for mild constipation.   mirtazapine (REMERON) 7.5 MG tablet Take 7.5 mg by mouth at bedtime.   Multiple Vitamins-Minerals (MULTIVITAMIN ADULT PO) Take 1 tablet by mouth daily.    nystatin (MYCOSTATIN/NYSTOP) powder Apply 1 Application topically every 12 (twelve) hours as needed. Topically to breasts as needed for rash.   ondansetron (ZOFRAN) 4 MG tablet Take 4 mg by mouth every 8 (eight) hours as needed for nausea or vomiting. Give 2 tablets by mouth every 8 hours as needed   [DISCONTINUED] famotidine (PEPCID) 10 MG tablet Take 10 mg by mouth at bedtime.   [DISCONTINUED] pantoprazole (PROTONIX) 40 MG tablet Take 1 tablet (40 mg total) by mouth 2 (two) times daily for 7 days.   [DISCONTINUED] pantoprazole (PROTONIX) 40 MG tablet Take 40 mg by mouth daily.   No facility-administered encounter medications on file as of 11/03/2022.    Review of Systems  Unable to obtain due to aphasia.    Immunization History  Administered Date(s) Administered   Influenza, High Dose Seasonal PF 04/05/2017   Influenza-Unspecified 02/04/2020, 02/01/2021, 02/01/2022   MODERNA COVID-19 SARS-COV-2 PEDS BIVALENT BOOSTER 35yr-27yr 04/29/2019, 05/27/2019, 02/25/2022   Moderna SARS-COV2 Booster Vaccination 09/04/2020   Pfizer Covid-19 Vaccine Bivalent Booster 42yrs & up 02/28/2020, 01/08/2021   Pneumococcal Conjugate-13 03/07/2018   Pneumococcal Polysaccharide-23 03/11/2019   Tdap 09/14/2010   Pertinent  Health Maintenance Due  Topic Date Due   DEXA SCAN  Never done   INFLUENZA VACCINE  11/17/2022      06/10/2022    2:55 PM  Fall  Risk  Falls in the past year? 0  Was there an injury with Fall? 0  Fall Risk Category Calculator 0  Patient at Risk for Falls Due to No Fall Risks  Fall risk Follow up Falls evaluation completed     Vitals:   11/03/22 1028 11/03/22 1148  BP: (!) 152/90 132/66  Pulse: 76   Temp: 97.6 F (36.4 C)   SpO2: 95%   Weight: 142 lb 6.4 oz (64.6 kg)   Height: 5\' 3"  (1.6 m)    Body mass index is 25.23 kg/m.  Physical Exam  Constitutional:      General: She is not in acute distress.    Appearance: Normal appearance.  HENT:     Head: Normocephalic and atraumatic.     Nose: Nose normal.     Mouth/Throat:     Mouth: Mucous membranes are moist.  Eyes:     Conjunctiva/sclera: Conjunctivae normal.  Cardiovascular:     Rate and Rhythm: Normal rate and regular rhythm.  Pulmonary:     Effort: Pulmonary effort is normal.     Breath sounds: Normal breath sounds.  Abdominal:     General: Bowel sounds are normal.     Palpations: Abdomen is soft.  Genitourinary:    Comments: Has external hemorrhoids Musculoskeletal:        General: Normal range of motion.     Cervical back: Normal range of motion.  Skin:    General: Skin is warm and dry.  Neurological:     Mental Status: She is alert. Mental status is at baseline.     Comments: aphasia  Psychiatric:        Mood and Affect: Mood normal.        Behavior: Behavior normal.        Labs reviewed: Recent Labs    02/17/22 0000 06/13/22 0000 10/27/22 0000  NA 138 136* 139  K 4.3 4.1 3.9  CL 105 103 105  CO2 26* 25* 27*  BUN 16 20 13   CREATININE 1.1 1.0 1.0  CALCIUM 9.0 8.6* 8.7   Recent Labs    02/17/22 0000  AST 14  ALT 10  ALKPHOS 51  ALBUMIN 3.6   Recent Labs    02/17/22 0000 06/13/22 0000 08/18/22 0000 10/27/22 0000  WBC 5.1 4.7 5.3 6.3  NEUTROABS 3,111.00 2,980.00  --  4,171.00  HGB 12.5 12.1 11.8* 12.4  HCT  --  36 36 38  PLT 145* 109* 125* 147*   Lab Results  Component Value Date   TSH 2.04 06/13/2022    Lab Results  Component Value Date   HGBA1C 5.8 (H) 04/04/2017   Lab Results  Component Value Date   CHOL 138 02/17/2022   HDL 41 02/17/2022   LDLCALC 74 02/17/2022   TRIG 156 02/17/2022   CHOLHDL 6.3 04/04/2017    Significant Diagnostic Results in last 30 days:  No results found.  Assessment/Plan  1. Rectal bleed -  has history of GI bleed and ulcerative colitis -  DNH/DNR -  will increase Famotidine dosage from 10 mg Q HS to 20 mg BID - CBC stat was done this morning, awaiting result - Basic metabolic panel - famotidine (PEPCID) 20 MG tablet; Take 1 tablet (20 mg total) by mouth 2 (two) times daily.  Dispense: 60 tablet; Refill: 0  2. History of CVA (cerebrovascular accident) -  discontinue Plavix due to bleeding -  continue Atorvastatin   Family/ staff Communication: Discussed plan of care with charge nurse.  Labs/tests ordered:  CBC, BMP    Kenard Gower, DNP, MSN, FNP-BC [facility] [phone] (Monday-Friday 8:00 a.m. - 5:00 p.m.) [phone] (after hours)

## 2022-11-04 ENCOUNTER — Telehealth: Payer: Medicare Other | Admitting: Student

## 2022-11-04 DIAGNOSIS — I129 Hypertensive chronic kidney disease with stage 1 through stage 4 chronic kidney disease, or unspecified chronic kidney disease: Secondary | ICD-10-CM | POA: Diagnosis not present

## 2022-11-04 DIAGNOSIS — I69351 Hemiplegia and hemiparesis following cerebral infarction affecting right dominant side: Secondary | ICD-10-CM | POA: Diagnosis not present

## 2022-11-04 DIAGNOSIS — M797 Fibromyalgia: Secondary | ICD-10-CM | POA: Diagnosis not present

## 2022-11-04 DIAGNOSIS — M6281 Muscle weakness (generalized): Secondary | ICD-10-CM | POA: Diagnosis not present

## 2022-11-04 DIAGNOSIS — I10 Essential (primary) hypertension: Secondary | ICD-10-CM

## 2022-11-04 DIAGNOSIS — D649 Anemia, unspecified: Secondary | ICD-10-CM | POA: Diagnosis not present

## 2022-11-04 DIAGNOSIS — K625 Hemorrhage of anus and rectum: Secondary | ICD-10-CM | POA: Diagnosis not present

## 2022-11-04 DIAGNOSIS — N183 Chronic kidney disease, stage 3 unspecified: Secondary | ICD-10-CM | POA: Diagnosis not present

## 2022-11-04 DIAGNOSIS — I69390 Apraxia following cerebral infarction: Secondary | ICD-10-CM

## 2022-11-04 DIAGNOSIS — Z8673 Personal history of transient ischemic attack (TIA), and cerebral infarction without residual deficits: Secondary | ICD-10-CM

## 2022-11-04 LAB — CBC AND DIFFERENTIAL
HCT: 37 (ref 36–46)
Hemoglobin: 11.9 — AB (ref 12.0–16.0)
Platelets: 225 10*3/uL (ref 150–400)
WBC: 11.2

## 2022-11-04 LAB — CBC: RBC: 4.12 (ref 3.87–5.11)

## 2022-11-04 NOTE — Telephone Encounter (Signed)
Provider: [facility] with [facility] HCPOA: Offsite  Spoke with patient's daughter regarding blood in stool. Patient had an episode yesterday and we spoke for 20 minutes at that time where she stated she would not want to have a colonoscopy, but may want her to go back to the hospital. DNR upholds, but would be open to hospitalization. Discussed managing conservatively with CBC today and potentially through the weekend. Discussed checking vital signs more often through the weekend and starting Protonix if there is indeed a bleed. Discussed concern for poor communication from the patient (has expressive aphasia) as well as risk of delirium in the hospital and without desire to have an intervention, going could cause more confusion and weakness for the patient.   At this time, will plan for CBC in house and q8 vital signs. Start Protonix 40 mg BID.   Discussed if drop >1 point, could consider transition to the hospital. Yesterday Hgb 11.0.   All questions answered.   I spent greater than 15 minutes for the care of this patient in face to face time, chart review, telephone communication with HCPOA, clinical documentation, patient education.

## 2022-11-07 DIAGNOSIS — M797 Fibromyalgia: Secondary | ICD-10-CM | POA: Diagnosis not present

## 2022-11-07 DIAGNOSIS — I10 Essential (primary) hypertension: Secondary | ICD-10-CM | POA: Diagnosis not present

## 2022-11-07 DIAGNOSIS — I69351 Hemiplegia and hemiparesis following cerebral infarction affecting right dominant side: Secondary | ICD-10-CM | POA: Diagnosis not present

## 2022-11-07 DIAGNOSIS — M6281 Muscle weakness (generalized): Secondary | ICD-10-CM | POA: Diagnosis not present

## 2022-11-07 DIAGNOSIS — I129 Hypertensive chronic kidney disease with stage 1 through stage 4 chronic kidney disease, or unspecified chronic kidney disease: Secondary | ICD-10-CM | POA: Diagnosis not present

## 2022-11-07 DIAGNOSIS — N183 Chronic kidney disease, stage 3 unspecified: Secondary | ICD-10-CM | POA: Diagnosis not present

## 2022-11-07 DIAGNOSIS — D649 Anemia, unspecified: Secondary | ICD-10-CM | POA: Diagnosis not present

## 2022-11-07 LAB — BASIC METABOLIC PANEL
BUN: 17 (ref 4–21)
CO2: 29 — AB (ref 13–22)
Chloride: 105 (ref 99–108)
Creatinine: 1 (ref 0.5–1.1)
Glucose: 87
Potassium: 4.2 mEq/L (ref 3.5–5.1)
Sodium: 139 (ref 137–147)

## 2022-11-07 LAB — COMPREHENSIVE METABOLIC PANEL
Calcium: 8.7 (ref 8.7–10.7)
eGFR: 53

## 2022-11-07 LAB — CBC AND DIFFERENTIAL
HCT: 30 — AB (ref 36–46)
Hemoglobin: 9.6 — AB (ref 12.0–16.0)
Platelets: 146 10*3/uL — AB (ref 150–400)
WBC: 5.9

## 2022-11-07 LAB — CBC: RBC: 3.38 — AB (ref 3.87–5.11)

## 2022-11-08 DIAGNOSIS — M6281 Muscle weakness (generalized): Secondary | ICD-10-CM | POA: Diagnosis not present

## 2022-11-08 DIAGNOSIS — I69351 Hemiplegia and hemiparesis following cerebral infarction affecting right dominant side: Secondary | ICD-10-CM | POA: Diagnosis not present

## 2022-11-08 DIAGNOSIS — N183 Chronic kidney disease, stage 3 unspecified: Secondary | ICD-10-CM | POA: Diagnosis not present

## 2022-11-08 DIAGNOSIS — I129 Hypertensive chronic kidney disease with stage 1 through stage 4 chronic kidney disease, or unspecified chronic kidney disease: Secondary | ICD-10-CM | POA: Diagnosis not present

## 2022-11-08 DIAGNOSIS — M797 Fibromyalgia: Secondary | ICD-10-CM | POA: Diagnosis not present

## 2022-11-08 DIAGNOSIS — I10 Essential (primary) hypertension: Secondary | ICD-10-CM | POA: Diagnosis not present

## 2022-11-09 DIAGNOSIS — I129 Hypertensive chronic kidney disease with stage 1 through stage 4 chronic kidney disease, or unspecified chronic kidney disease: Secondary | ICD-10-CM | POA: Diagnosis not present

## 2022-11-09 DIAGNOSIS — M6281 Muscle weakness (generalized): Secondary | ICD-10-CM | POA: Diagnosis not present

## 2022-11-09 DIAGNOSIS — N183 Chronic kidney disease, stage 3 unspecified: Secondary | ICD-10-CM | POA: Diagnosis not present

## 2022-11-09 DIAGNOSIS — I69351 Hemiplegia and hemiparesis following cerebral infarction affecting right dominant side: Secondary | ICD-10-CM | POA: Diagnosis not present

## 2022-11-09 DIAGNOSIS — I10 Essential (primary) hypertension: Secondary | ICD-10-CM | POA: Diagnosis not present

## 2022-11-09 DIAGNOSIS — M797 Fibromyalgia: Secondary | ICD-10-CM | POA: Diagnosis not present

## 2022-11-10 ENCOUNTER — Non-Acute Institutional Stay (INDEPENDENT_AMBULATORY_CARE_PROVIDER_SITE_OTHER): Payer: Medicare Other | Admitting: Nurse Practitioner

## 2022-11-10 ENCOUNTER — Encounter: Payer: Self-pay | Admitting: Nurse Practitioner

## 2022-11-10 DIAGNOSIS — Z0000 Encounter for general adult medical examination without abnormal findings: Secondary | ICD-10-CM

## 2022-11-10 DIAGNOSIS — I10 Essential (primary) hypertension: Secondary | ICD-10-CM | POA: Diagnosis not present

## 2022-11-10 DIAGNOSIS — I69351 Hemiplegia and hemiparesis following cerebral infarction affecting right dominant side: Secondary | ICD-10-CM | POA: Diagnosis not present

## 2022-11-10 DIAGNOSIS — N183 Chronic kidney disease, stage 3 unspecified: Secondary | ICD-10-CM | POA: Diagnosis not present

## 2022-11-10 DIAGNOSIS — M797 Fibromyalgia: Secondary | ICD-10-CM | POA: Diagnosis not present

## 2022-11-10 DIAGNOSIS — I129 Hypertensive chronic kidney disease with stage 1 through stage 4 chronic kidney disease, or unspecified chronic kidney disease: Secondary | ICD-10-CM | POA: Diagnosis not present

## 2022-11-10 DIAGNOSIS — M6281 Muscle weakness (generalized): Secondary | ICD-10-CM | POA: Diagnosis not present

## 2022-11-10 NOTE — Progress Notes (Signed)
Subjective:   Kaitlyn Ramos is a 89 y.o. female who presents for Medicare Annual (Subsequent) preventive examination.  Visit Complete: In person at [facility]   Patient Medicare AWV questionnaire was completed by the patient on 7/25; I have confirmed that all information answered by patient is correct and no changes since this date.  Review of Systems     Cardiac Risk Factors include: advanced age (>65men, >63 women);sedentary lifestyle;hypertension;dyslipidemia     Objective:    Today's Vitals   11/10/22 1021 11/10/22 1147  BP: 123/79   Pulse: 92   Resp: 17   Temp: 97.6 F (36.4 C)   SpO2: 95%   Weight: 142 lb 6.4 oz (64.6 kg)   Height: 5\' 3"  (1.6 m)   PainSc:  0-No pain   Body mass index is 25.23 kg/m.     11/10/2022   10:34 AM 11/03/2022   10:32 AM 09/16/2022    9:44 AM 09/15/2022    1:49 PM 08/25/2022   12:52 PM 06/29/2022   11:03 AM 06/27/2022    3:28 PM  Advanced Directives  Does Patient Have a Medical Advance Directive? Yes Yes Yes Yes Yes Yes Yes  Type of Advance Directive Out of facility DNR (pink MOST or yellow form) Out of facility DNR (pink MOST or yellow form) Out of facility DNR (pink MOST or yellow form) Out of facility DNR (pink MOST or yellow form) Out of facility DNR (pink MOST or yellow form);Healthcare Power of [employer] of facility DNR (pink MOST or yellow form) Out of facility DNR (pink MOST or yellow form)  Does patient want to make changes to medical advance directive? No - Patient declined No - Patient declined No - Patient declined No - Patient declined No - Patient declined No - Patient declined No - Patient declined  Copy of Healthcare Power of Attorney in Chart?    Yes - validated most recent copy scanned in chart (See row information) Yes - validated most recent copy scanned in chart (See row information) Yes - validated most recent copy scanned in chart (See row information)   Pre-existing out of facility DNR order (yellow form or pink MOST  form)  Yellow form placed in chart (order not valid for inpatient use);Pink MOST form placed in chart (order not valid for inpatient use)  Yellow form placed in chart (order not valid for inpatient use);Pink MOST form placed in chart (order not valid for inpatient use)       Current Medications (verified) Outpatient Encounter Medications as of 11/10/2022  Medication Sig   acetaminophen (TYLENOL) 500 MG tablet Take 1,000 mg by mouth 3 (three) times daily.   atorvastatin (LIPITOR) 40 MG tablet Take 1 tablet (40 mg total) by mouth daily at 6 PM.   Calcium Carbonate-Vit D-Min (CALCIUM 600+D PLUS MINERALS) 600-400 MG-UNIT TABS Take 1 tablet by mouth daily.   Cholecalciferol (D3-50) 50000 units capsule Take 50,000 Units by mouth every 30 (thirty) days.   cyanocobalamin 1000 MCG tablet Take 1,000 mcg by mouth daily.   docusate sodium (COLACE) 100 MG capsule Take 100 mg by mouth daily.   DULoxetine (CYMBALTA) 30 MG capsule Take 30 mg by mouth daily.   ferrous sulfate 300 (60 Fe) MG/5ML syrup Take 5 mLs by mouth daily.   fluticasone (FLONASE) 50 MCG/ACT nasal spray Place 2 sprays into both nostrils daily.   levothyroxine (SYNTHROID, LEVOTHROID) 50 MCG tablet Take 50 mcg by mouth daily before breakfast.   lidocaine (ASPERCREME LIDOCAINE) 4 %  Place 1 patch onto the skin daily. As needed for pain   losartan (COZAAR) 50 MG tablet Take 50 mg by mouth daily.    magnesium hydroxide (MILK OF MAGNESIA) 400 MG/5ML suspension Take 30 mLs by mouth every 12 (twelve) hours as needed for mild constipation.   mirtazapine (REMERON) 7.5 MG tablet Take 7.5 mg by mouth at bedtime.   Multiple Vitamins-Minerals (MULTIVITAMIN ADULT PO) Take 1 tablet by mouth daily.    pantoprazole sodium (PROTONIX) 40 mg Take 40 mg by mouth 2 (two) times daily.   [DISCONTINUED] clopidogrel (PLAVIX) 75 MG tablet Take 75 mg by mouth daily.   [DISCONTINUED] famotidine (PEPCID) 20 MG tablet Take 1 tablet (20 mg total) by mouth 2 (two) times  daily.   [DISCONTINUED] nystatin (MYCOSTATIN/NYSTOP) powder Apply 1 Application topically every 12 (twelve) hours as needed. Topically to breasts as needed for rash.   [DISCONTINUED] ondansetron (ZOFRAN) 4 MG tablet Take 4 mg by mouth every 8 (eight) hours as needed for nausea or vomiting. Give 2 tablets by mouth every 8 hours as needed   No facility-administered encounter medications on file as of 11/10/2022.    Allergies (verified) Codeine sulfate and Codeine   History: Past Medical History:  Diagnosis Date   Cervicalgia    Depression    Essential hypertension    Hemorrhoids    History of MRSA infection    Hyperlipidemia    Hypertension    Hypothyroidism    IBS (irritable bowel syndrome)    Osteoarthritis of multiple joints    Restless leg    Senile osteoporosis    Shingles    Sleep apnea    Ulcerative colitis (HCC)    Urge incontinence    Past Surgical History:  Procedure Laterality Date   ABDOMINAL HYSTERECTOMY  1980   cataract surgery     CHOLECYSTECTOMY  1955   colon polyp removal  12/2012   ESOPHAGOGASTRODUODENOSCOPY  2014   makoplasty Left 06/2012   parotid gland removal  1984   VISCERAL ANGIOGRAPHY N/A 09/13/2017   Procedure: VISCERAL ANGIOGRAPHY;  Surgeon: Renford Dills, MD;  Location: [facility] INVASIVE CV LAB;  Service: Cardiovascular;  Laterality: N/A;   Family History  Problem Relation Age of Onset   Breast cancer Daughter    Colon cancer Maternal Grandfather    Hypertension Father    Dementia Father    Arthritis Mother    Osteoporosis Mother    Kidney disease Neg Hx    Bladder Cancer Neg Hx    Social History   Socioeconomic History   Marital status: Divorced    Spouse name: Not on file   Number of children: Not on file   Years of education: Not on file   Highest education level: Not on file  Occupational History   Not on file  Tobacco Use   Smoking status: Former   Smokeless tobacco: Former   Tobacco comments:    quit at age 81  Vaping  Use   Vaping status: Never Used  Substance and Sexual Activity   Alcohol use: No    Alcohol/week: 0.0 standard drinks of alcohol    Comment: occasional   Drug use: No   Sexual activity: Not Currently  Other Topics Concern   Not on file  Social History Narrative   ** Merged History Encounter **       Social Determinants of Health   Financial Resource Strain: Low Risk  (04/03/2017)   Overall Financial Resource Strain (CARDIA)  Difficulty of Paying Living Expenses: Not hard at all  Food Insecurity: No Food Insecurity (04/03/2017)   Hunger Vital Sign    Worried About Running Out of Food in the Last Year: Never true    Ran Out of Food in the Last Year: Never true  Transportation Needs: No Transportation Needs (04/03/2017)   PRAPARE - Administrator, Civil Service (Medical): No    Lack of Transportation (Non-Medical): No  Physical Activity: Unknown (04/03/2017)   Exercise Vital Sign    Days of Exercise per Week: Patient declined    Minutes of Exercise per Session: Patient declined  Stress: No Stress Concern Present (04/03/2017)   [employer] of Occupational Health - Occupational Stress Questionnaire    Feeling of Stress : Not at all  Social Connections: Unknown (04/03/2017)   Social Connection and Isolation Panel [NHANES]    Frequency of Communication with Friends and Family: Patient declined    Frequency of Social Gatherings with Friends and Family: Patient declined    Attends Religious Services: Patient declined    Database administrator or Organizations: Patient declined    Attends Engineer, structural: Patient declined    Marital Status: Patient declined    Tobacco Counseling Counseling given: Not Answered Tobacco comments: quit at age 39   Clinical Intake:  Pre-visit preparation completed: Yes  Pain : No/denies pain Pain Score: 0-No pain     BMI - recorded: 25 Nutritional Status: BMI 25 -29 Overweight Nutritional Risks:  None Diabetes: No  How often do you need to have someone help you when you read instructions, pamphlets, or other written materials from your doctor or pharmacy?: 4 - Often         Activities of Daily Living    11/10/2022   11:39 AM  In your present state of health, do you have any difficulty performing the following activities:  Hearing? 0  Vision? 0  Difficulty concentrating or making decisions? 1  Walking or climbing stairs? 1  Dressing or bathing? 1  Doing errands, shopping? 1  Preparing Food and eating ? Y  Using the Toilet? Y  In the past six months, have you accidently leaked urine? Y  Do you have problems with loss of bowel control? N  Managing your Medications? Y  Managing your Finances? Y  Housekeeping or managing your Housekeeping? Y    Patient Care Team: Earnestine Mealing, MD as PCP - General (Family Medicine)  Indicate any recent Medical Services you may have received from other than [facility] providers in the past year (date may be approximate).     Assessment:   This is a routine wellness examination for Letita.  Hearing/Vision screen No results found.  Dietary issues and exercise activities discussed:     Goals Addressed   None    Depression Screen    06/10/2022    2:55 PM  PHQ 2/9 Scores  PHQ - 2 Score 0    Fall Risk    11/10/2022   11:48 AM 06/10/2022    2:55 PM  Fall Risk   Falls in the past year? 1 0  Number falls in past yr: 1 0  Injury with Fall? 1 0  Risk for fall due to : History of fall(s);Impaired balance/gait;Impaired mobility No Fall Risks  Follow up Falls evaluation completed;Education provided Falls evaluation completed    MEDICARE RISK AT HOME:   TIMED UP AND GO:  Was the test performed?  No    Cognitive  Function:        Immunizations Immunization History  Administered Date(s) Administered   Covid-19, Mrna,Vaccine(Spikevax)82yrs and older 07/26/2022   Influenza, High Dose Seasonal PF 04/05/2017    Influenza-Unspecified 02/04/2020, 02/01/2021, 02/01/2022   MODERNA COVID-19 SARS-COV-2 PEDS BIVALENT BOOSTER 74yr-50yr 04/29/2019, 05/27/2019, 02/25/2022   Moderna SARS-COV2 Booster Vaccination 09/04/2020   Pfizer Covid-19 Vaccine Bivalent Booster 58yrs & up 02/28/2020, 01/08/2021   Pneumococcal Conjugate-13 03/07/2018   Pneumococcal Polysaccharide-23 03/11/2019   Tdap 09/14/2010    TDAP status: Due, Education has been provided regarding the importance of this vaccine. Advised may receive this vaccine at local pharmacy or Health Dept. Aware to provide a copy of the vaccination record if obtained from local pharmacy or Health Dept. Verbalized acceptance and understanding.  Flu Vaccine status: Up to date  Pneumococcal vaccine status: Up to date  Covid-19 vaccine status: Information provided on how to obtain vaccines.   Qualifies for Shingles Vaccine? Yes   Zostavax completed No   Shingrix Completed?: No.    Education has been provided regarding the importance of this vaccine. Patient has been advised to call insurance company to determine out of pocket expense if they have not yet received this vaccine. Advised may also receive vaccine at local pharmacy or Health Dept. Verbalized acceptance and understanding.  Screening Tests Health Maintenance  Topic Date Due   Zoster Vaccines- Shingrix (1 of 2) Never done   DTaP/Tdap/Td (2 - Td or Tdap) 09/13/2020   INFLUENZA VACCINE  11/17/2022   COVID-19 Vaccine (8 - 2023-24 season) 11/25/2022   Medicare Annual Wellness (AWV)  11/10/2023   Pneumonia Vaccine 73+ Years old  Completed   HPV VACCINES  Aged Out   DEXA SCAN  Discontinued    Health Maintenance  Health Maintenance Due  Topic Date Due   Zoster Vaccines- Shingrix (1 of 2) Never done   DTaP/Tdap/Td (2 - Td or Tdap) 09/13/2020    Colorectal cancer screening: No longer required.   Mammogram status: No longer required due to age.  Not candidate for bone density due to immobility    Lung Cancer Screening: (Low Dose CT Chest recommended if Age 60-80 years, 20 pack-year currently smoking OR have quit w/in 15years.) does not qualify.   Lung Cancer Screening Referral: na  Additional Screening:  Hepatitis C Screening: does not qualify  Vision Screening: Recommended annual ophthalmology exams for early detection of glaucoma and other disorders of the eye. Is the patient up to date with their annual eye exam?  No  Who is the provider or what is the name of the office in which the patient attends annual eye exams? Unsure    Dental Screening: Recommended annual dental exams for proper oral hygiene   Community Resource Referral / Chronic Care Management: CRR required this visit?  No   CCM required this visit?  No     Plan:     I have personally reviewed and noted the following in the patient's chart:   Medical and social history Use of alcohol, tobacco or illicit drugs  Current medications and supplements including opioid prescriptions. Patient is not currently taking opioid prescriptions. Functional ability and status Nutritional status Physical activity Advanced directives List of other physicians Hospitalizations, surgeries, and ER visits in previous 12 months Vitals Screenings to include cognitive, depression, and falls Referrals and appointments  In addition, I have reviewed and discussed with patient certain preventive protocols, quality metrics, and best practice recommendations. A written personalized care plan for preventive services as well  as general preventive health recommendations were provided to patient.     Sharon Seller, NP   11/10/2022

## 2022-11-10 NOTE — Patient Instructions (Addendum)
  Kaitlyn Ramos , Thank you for taking time to come for your Medicare Wellness Visit. I appreciate your ongoing commitment to your health goals. Please review the following plan we discussed and let me know if I can assist you in the future.   These are the goals we discussed:  Goals   None     This is a list of the screening recommended for you and due dates:  Health Maintenance  Topic Date Due   Zoster (Shingles) Vaccine (1 of 2) Never done   DTaP/Tdap/Td vaccine (2 - Td or Tdap) 09/13/2020   Flu Shot  11/17/2022   COVID-19 Vaccine (8 - 2023-24 season) 11/25/2022   Medicare Annual Wellness Visit  11/10/2023   Pneumonia Vaccine  Completed   HPV Vaccine  Aged Out   DEXA scan (bone density measurement)  Discontinued   If you would like to have shingrix vaccine or TDAP can be given in facility to let nursing know.

## 2022-11-11 DIAGNOSIS — M797 Fibromyalgia: Secondary | ICD-10-CM | POA: Diagnosis not present

## 2022-11-11 DIAGNOSIS — N183 Chronic kidney disease, stage 3 unspecified: Secondary | ICD-10-CM | POA: Diagnosis not present

## 2022-11-11 DIAGNOSIS — I10 Essential (primary) hypertension: Secondary | ICD-10-CM | POA: Diagnosis not present

## 2022-11-11 DIAGNOSIS — I69351 Hemiplegia and hemiparesis following cerebral infarction affecting right dominant side: Secondary | ICD-10-CM | POA: Diagnosis not present

## 2022-11-11 DIAGNOSIS — I129 Hypertensive chronic kidney disease with stage 1 through stage 4 chronic kidney disease, or unspecified chronic kidney disease: Secondary | ICD-10-CM | POA: Diagnosis not present

## 2022-11-11 DIAGNOSIS — M6281 Muscle weakness (generalized): Secondary | ICD-10-CM | POA: Diagnosis not present

## 2022-11-14 DIAGNOSIS — D649 Anemia, unspecified: Secondary | ICD-10-CM | POA: Diagnosis not present

## 2022-11-14 LAB — CBC: RBC: 3.29 — AB (ref 3.87–5.11)

## 2022-11-14 LAB — CBC AND DIFFERENTIAL
HCT: 30 — AB (ref 36–46)
Hemoglobin: 9.6 — AB (ref 12.0–16.0)
Platelets: 173 10*3/uL (ref 150–400)
WBC: 5.9

## 2022-11-15 DIAGNOSIS — N183 Chronic kidney disease, stage 3 unspecified: Secondary | ICD-10-CM | POA: Diagnosis not present

## 2022-11-15 DIAGNOSIS — I10 Essential (primary) hypertension: Secondary | ICD-10-CM | POA: Diagnosis not present

## 2022-11-15 DIAGNOSIS — M797 Fibromyalgia: Secondary | ICD-10-CM | POA: Diagnosis not present

## 2022-11-15 DIAGNOSIS — I129 Hypertensive chronic kidney disease with stage 1 through stage 4 chronic kidney disease, or unspecified chronic kidney disease: Secondary | ICD-10-CM | POA: Diagnosis not present

## 2022-11-15 DIAGNOSIS — I69351 Hemiplegia and hemiparesis following cerebral infarction affecting right dominant side: Secondary | ICD-10-CM | POA: Diagnosis not present

## 2022-11-15 DIAGNOSIS — M6281 Muscle weakness (generalized): Secondary | ICD-10-CM | POA: Diagnosis not present

## 2022-11-16 DIAGNOSIS — M6281 Muscle weakness (generalized): Secondary | ICD-10-CM | POA: Diagnosis not present

## 2022-11-16 DIAGNOSIS — N183 Chronic kidney disease, stage 3 unspecified: Secondary | ICD-10-CM | POA: Diagnosis not present

## 2022-11-16 DIAGNOSIS — M797 Fibromyalgia: Secondary | ICD-10-CM | POA: Diagnosis not present

## 2022-11-16 DIAGNOSIS — I10 Essential (primary) hypertension: Secondary | ICD-10-CM | POA: Diagnosis not present

## 2022-11-16 DIAGNOSIS — I69351 Hemiplegia and hemiparesis following cerebral infarction affecting right dominant side: Secondary | ICD-10-CM | POA: Diagnosis not present

## 2022-11-16 DIAGNOSIS — I129 Hypertensive chronic kidney disease with stage 1 through stage 4 chronic kidney disease, or unspecified chronic kidney disease: Secondary | ICD-10-CM | POA: Diagnosis not present

## 2022-11-17 DIAGNOSIS — Z9181 History of falling: Secondary | ICD-10-CM | POA: Diagnosis not present

## 2022-11-17 DIAGNOSIS — M6281 Muscle weakness (generalized): Secondary | ICD-10-CM | POA: Diagnosis not present

## 2022-11-17 DIAGNOSIS — I69351 Hemiplegia and hemiparesis following cerebral infarction affecting right dominant side: Secondary | ICD-10-CM | POA: Diagnosis not present

## 2022-11-17 DIAGNOSIS — M797 Fibromyalgia: Secondary | ICD-10-CM | POA: Diagnosis not present

## 2022-11-17 DIAGNOSIS — Z741 Need for assistance with personal care: Secondary | ICD-10-CM | POA: Diagnosis not present

## 2022-11-17 DIAGNOSIS — R2689 Other abnormalities of gait and mobility: Secondary | ICD-10-CM | POA: Diagnosis not present

## 2022-11-17 DIAGNOSIS — R278 Other lack of coordination: Secondary | ICD-10-CM | POA: Diagnosis not present

## 2022-11-17 DIAGNOSIS — I10 Essential (primary) hypertension: Secondary | ICD-10-CM | POA: Diagnosis not present

## 2022-11-17 DIAGNOSIS — D649 Anemia, unspecified: Secondary | ICD-10-CM | POA: Diagnosis not present

## 2022-11-17 DIAGNOSIS — N183 Chronic kidney disease, stage 3 unspecified: Secondary | ICD-10-CM | POA: Diagnosis not present

## 2022-11-17 DIAGNOSIS — I129 Hypertensive chronic kidney disease with stage 1 through stage 4 chronic kidney disease, or unspecified chronic kidney disease: Secondary | ICD-10-CM | POA: Diagnosis not present

## 2022-11-17 LAB — CBC AND DIFFERENTIAL
HCT: 31 — AB (ref 36–46)
Hemoglobin: 10 — AB (ref 12.0–16.0)
Neutrophils Absolute: 3038
Platelets: 166 10*3/uL (ref 150–400)
WBC: 4.9

## 2022-11-17 LAB — CBC: RBC: 3.47 — AB (ref 3.87–5.11)

## 2022-11-18 DIAGNOSIS — I129 Hypertensive chronic kidney disease with stage 1 through stage 4 chronic kidney disease, or unspecified chronic kidney disease: Secondary | ICD-10-CM | POA: Diagnosis not present

## 2022-11-18 DIAGNOSIS — M797 Fibromyalgia: Secondary | ICD-10-CM | POA: Diagnosis not present

## 2022-11-18 DIAGNOSIS — N183 Chronic kidney disease, stage 3 unspecified: Secondary | ICD-10-CM | POA: Diagnosis not present

## 2022-11-18 DIAGNOSIS — I69351 Hemiplegia and hemiparesis following cerebral infarction affecting right dominant side: Secondary | ICD-10-CM | POA: Diagnosis not present

## 2022-11-18 DIAGNOSIS — M6281 Muscle weakness (generalized): Secondary | ICD-10-CM | POA: Diagnosis not present

## 2022-11-18 DIAGNOSIS — I10 Essential (primary) hypertension: Secondary | ICD-10-CM | POA: Diagnosis not present

## 2022-11-22 DIAGNOSIS — I69351 Hemiplegia and hemiparesis following cerebral infarction affecting right dominant side: Secondary | ICD-10-CM | POA: Diagnosis not present

## 2022-11-22 DIAGNOSIS — M797 Fibromyalgia: Secondary | ICD-10-CM | POA: Diagnosis not present

## 2022-11-22 DIAGNOSIS — N183 Chronic kidney disease, stage 3 unspecified: Secondary | ICD-10-CM | POA: Diagnosis not present

## 2022-11-22 DIAGNOSIS — I129 Hypertensive chronic kidney disease with stage 1 through stage 4 chronic kidney disease, or unspecified chronic kidney disease: Secondary | ICD-10-CM | POA: Diagnosis not present

## 2022-11-22 DIAGNOSIS — I10 Essential (primary) hypertension: Secondary | ICD-10-CM | POA: Diagnosis not present

## 2022-11-22 DIAGNOSIS — M6281 Muscle weakness (generalized): Secondary | ICD-10-CM | POA: Diagnosis not present

## 2022-11-24 DIAGNOSIS — I129 Hypertensive chronic kidney disease with stage 1 through stage 4 chronic kidney disease, or unspecified chronic kidney disease: Secondary | ICD-10-CM | POA: Diagnosis not present

## 2022-11-24 DIAGNOSIS — I10 Essential (primary) hypertension: Secondary | ICD-10-CM | POA: Diagnosis not present

## 2022-11-24 DIAGNOSIS — M797 Fibromyalgia: Secondary | ICD-10-CM | POA: Diagnosis not present

## 2022-11-24 DIAGNOSIS — N183 Chronic kidney disease, stage 3 unspecified: Secondary | ICD-10-CM | POA: Diagnosis not present

## 2022-11-24 DIAGNOSIS — I69351 Hemiplegia and hemiparesis following cerebral infarction affecting right dominant side: Secondary | ICD-10-CM | POA: Diagnosis not present

## 2022-11-24 DIAGNOSIS — M6281 Muscle weakness (generalized): Secondary | ICD-10-CM | POA: Diagnosis not present

## 2022-11-25 DIAGNOSIS — M797 Fibromyalgia: Secondary | ICD-10-CM | POA: Diagnosis not present

## 2022-11-25 DIAGNOSIS — M6281 Muscle weakness (generalized): Secondary | ICD-10-CM | POA: Diagnosis not present

## 2022-11-25 DIAGNOSIS — I129 Hypertensive chronic kidney disease with stage 1 through stage 4 chronic kidney disease, or unspecified chronic kidney disease: Secondary | ICD-10-CM | POA: Diagnosis not present

## 2022-11-25 DIAGNOSIS — N183 Chronic kidney disease, stage 3 unspecified: Secondary | ICD-10-CM | POA: Diagnosis not present

## 2022-11-25 DIAGNOSIS — I10 Essential (primary) hypertension: Secondary | ICD-10-CM | POA: Diagnosis not present

## 2022-11-25 DIAGNOSIS — I69351 Hemiplegia and hemiparesis following cerebral infarction affecting right dominant side: Secondary | ICD-10-CM | POA: Diagnosis not present

## 2022-11-28 DIAGNOSIS — N183 Chronic kidney disease, stage 3 unspecified: Secondary | ICD-10-CM | POA: Diagnosis not present

## 2022-11-28 DIAGNOSIS — M797 Fibromyalgia: Secondary | ICD-10-CM | POA: Diagnosis not present

## 2022-11-28 DIAGNOSIS — I129 Hypertensive chronic kidney disease with stage 1 through stage 4 chronic kidney disease, or unspecified chronic kidney disease: Secondary | ICD-10-CM | POA: Diagnosis not present

## 2022-11-28 DIAGNOSIS — M6281 Muscle weakness (generalized): Secondary | ICD-10-CM | POA: Diagnosis not present

## 2022-11-28 DIAGNOSIS — I10 Essential (primary) hypertension: Secondary | ICD-10-CM | POA: Diagnosis not present

## 2022-11-28 DIAGNOSIS — I69351 Hemiplegia and hemiparesis following cerebral infarction affecting right dominant side: Secondary | ICD-10-CM | POA: Diagnosis not present

## 2022-11-30 DIAGNOSIS — N183 Chronic kidney disease, stage 3 unspecified: Secondary | ICD-10-CM | POA: Diagnosis not present

## 2022-11-30 DIAGNOSIS — M6281 Muscle weakness (generalized): Secondary | ICD-10-CM | POA: Diagnosis not present

## 2022-11-30 DIAGNOSIS — M797 Fibromyalgia: Secondary | ICD-10-CM | POA: Diagnosis not present

## 2022-11-30 DIAGNOSIS — I69351 Hemiplegia and hemiparesis following cerebral infarction affecting right dominant side: Secondary | ICD-10-CM | POA: Diagnosis not present

## 2022-11-30 DIAGNOSIS — I129 Hypertensive chronic kidney disease with stage 1 through stage 4 chronic kidney disease, or unspecified chronic kidney disease: Secondary | ICD-10-CM | POA: Diagnosis not present

## 2022-11-30 DIAGNOSIS — I10 Essential (primary) hypertension: Secondary | ICD-10-CM | POA: Diagnosis not present

## 2022-12-01 DIAGNOSIS — M797 Fibromyalgia: Secondary | ICD-10-CM | POA: Diagnosis not present

## 2022-12-01 DIAGNOSIS — N183 Chronic kidney disease, stage 3 unspecified: Secondary | ICD-10-CM | POA: Diagnosis not present

## 2022-12-01 DIAGNOSIS — I10 Essential (primary) hypertension: Secondary | ICD-10-CM | POA: Diagnosis not present

## 2022-12-01 DIAGNOSIS — I69351 Hemiplegia and hemiparesis following cerebral infarction affecting right dominant side: Secondary | ICD-10-CM | POA: Diagnosis not present

## 2022-12-01 DIAGNOSIS — M6281 Muscle weakness (generalized): Secondary | ICD-10-CM | POA: Diagnosis not present

## 2022-12-01 DIAGNOSIS — I129 Hypertensive chronic kidney disease with stage 1 through stage 4 chronic kidney disease, or unspecified chronic kidney disease: Secondary | ICD-10-CM | POA: Diagnosis not present

## 2022-12-05 DIAGNOSIS — N183 Chronic kidney disease, stage 3 unspecified: Secondary | ICD-10-CM | POA: Diagnosis not present

## 2022-12-05 DIAGNOSIS — I129 Hypertensive chronic kidney disease with stage 1 through stage 4 chronic kidney disease, or unspecified chronic kidney disease: Secondary | ICD-10-CM | POA: Diagnosis not present

## 2022-12-05 DIAGNOSIS — M6281 Muscle weakness (generalized): Secondary | ICD-10-CM | POA: Diagnosis not present

## 2022-12-05 DIAGNOSIS — I69351 Hemiplegia and hemiparesis following cerebral infarction affecting right dominant side: Secondary | ICD-10-CM | POA: Diagnosis not present

## 2022-12-05 DIAGNOSIS — M797 Fibromyalgia: Secondary | ICD-10-CM | POA: Diagnosis not present

## 2022-12-05 DIAGNOSIS — I10 Essential (primary) hypertension: Secondary | ICD-10-CM | POA: Diagnosis not present

## 2022-12-06 DIAGNOSIS — I69351 Hemiplegia and hemiparesis following cerebral infarction affecting right dominant side: Secondary | ICD-10-CM | POA: Diagnosis not present

## 2022-12-06 DIAGNOSIS — M6281 Muscle weakness (generalized): Secondary | ICD-10-CM | POA: Diagnosis not present

## 2022-12-06 DIAGNOSIS — N183 Chronic kidney disease, stage 3 unspecified: Secondary | ICD-10-CM | POA: Diagnosis not present

## 2022-12-06 DIAGNOSIS — I10 Essential (primary) hypertension: Secondary | ICD-10-CM | POA: Diagnosis not present

## 2022-12-06 DIAGNOSIS — I129 Hypertensive chronic kidney disease with stage 1 through stage 4 chronic kidney disease, or unspecified chronic kidney disease: Secondary | ICD-10-CM | POA: Diagnosis not present

## 2022-12-06 DIAGNOSIS — M797 Fibromyalgia: Secondary | ICD-10-CM | POA: Diagnosis not present

## 2022-12-07 DIAGNOSIS — M6281 Muscle weakness (generalized): Secondary | ICD-10-CM | POA: Diagnosis not present

## 2022-12-07 DIAGNOSIS — M797 Fibromyalgia: Secondary | ICD-10-CM | POA: Diagnosis not present

## 2022-12-07 DIAGNOSIS — I129 Hypertensive chronic kidney disease with stage 1 through stage 4 chronic kidney disease, or unspecified chronic kidney disease: Secondary | ICD-10-CM | POA: Diagnosis not present

## 2022-12-07 DIAGNOSIS — I69351 Hemiplegia and hemiparesis following cerebral infarction affecting right dominant side: Secondary | ICD-10-CM | POA: Diagnosis not present

## 2022-12-07 DIAGNOSIS — N183 Chronic kidney disease, stage 3 unspecified: Secondary | ICD-10-CM | POA: Diagnosis not present

## 2022-12-07 DIAGNOSIS — I10 Essential (primary) hypertension: Secondary | ICD-10-CM | POA: Diagnosis not present

## 2022-12-08 DIAGNOSIS — I69351 Hemiplegia and hemiparesis following cerebral infarction affecting right dominant side: Secondary | ICD-10-CM | POA: Diagnosis not present

## 2022-12-08 DIAGNOSIS — I129 Hypertensive chronic kidney disease with stage 1 through stage 4 chronic kidney disease, or unspecified chronic kidney disease: Secondary | ICD-10-CM | POA: Diagnosis not present

## 2022-12-08 DIAGNOSIS — M797 Fibromyalgia: Secondary | ICD-10-CM | POA: Diagnosis not present

## 2022-12-08 DIAGNOSIS — N183 Chronic kidney disease, stage 3 unspecified: Secondary | ICD-10-CM | POA: Diagnosis not present

## 2022-12-08 DIAGNOSIS — M6281 Muscle weakness (generalized): Secondary | ICD-10-CM | POA: Diagnosis not present

## 2022-12-08 DIAGNOSIS — I10 Essential (primary) hypertension: Secondary | ICD-10-CM | POA: Diagnosis not present

## 2022-12-09 DIAGNOSIS — I69351 Hemiplegia and hemiparesis following cerebral infarction affecting right dominant side: Secondary | ICD-10-CM | POA: Diagnosis not present

## 2022-12-09 DIAGNOSIS — I10 Essential (primary) hypertension: Secondary | ICD-10-CM | POA: Diagnosis not present

## 2022-12-09 DIAGNOSIS — M6281 Muscle weakness (generalized): Secondary | ICD-10-CM | POA: Diagnosis not present

## 2022-12-09 DIAGNOSIS — N183 Chronic kidney disease, stage 3 unspecified: Secondary | ICD-10-CM | POA: Diagnosis not present

## 2022-12-09 DIAGNOSIS — M797 Fibromyalgia: Secondary | ICD-10-CM | POA: Diagnosis not present

## 2022-12-09 DIAGNOSIS — I129 Hypertensive chronic kidney disease with stage 1 through stage 4 chronic kidney disease, or unspecified chronic kidney disease: Secondary | ICD-10-CM | POA: Diagnosis not present

## 2022-12-12 DIAGNOSIS — M6281 Muscle weakness (generalized): Secondary | ICD-10-CM | POA: Diagnosis not present

## 2022-12-12 DIAGNOSIS — I69351 Hemiplegia and hemiparesis following cerebral infarction affecting right dominant side: Secondary | ICD-10-CM | POA: Diagnosis not present

## 2022-12-12 DIAGNOSIS — M797 Fibromyalgia: Secondary | ICD-10-CM | POA: Diagnosis not present

## 2022-12-12 DIAGNOSIS — I129 Hypertensive chronic kidney disease with stage 1 through stage 4 chronic kidney disease, or unspecified chronic kidney disease: Secondary | ICD-10-CM | POA: Diagnosis not present

## 2022-12-12 DIAGNOSIS — I10 Essential (primary) hypertension: Secondary | ICD-10-CM | POA: Diagnosis not present

## 2022-12-12 DIAGNOSIS — N183 Chronic kidney disease, stage 3 unspecified: Secondary | ICD-10-CM | POA: Diagnosis not present

## 2022-12-14 DIAGNOSIS — I10 Essential (primary) hypertension: Secondary | ICD-10-CM | POA: Diagnosis not present

## 2022-12-14 DIAGNOSIS — N183 Chronic kidney disease, stage 3 unspecified: Secondary | ICD-10-CM | POA: Diagnosis not present

## 2022-12-14 DIAGNOSIS — M797 Fibromyalgia: Secondary | ICD-10-CM | POA: Diagnosis not present

## 2022-12-14 DIAGNOSIS — I69351 Hemiplegia and hemiparesis following cerebral infarction affecting right dominant side: Secondary | ICD-10-CM | POA: Diagnosis not present

## 2022-12-14 DIAGNOSIS — M6281 Muscle weakness (generalized): Secondary | ICD-10-CM | POA: Diagnosis not present

## 2022-12-14 DIAGNOSIS — I129 Hypertensive chronic kidney disease with stage 1 through stage 4 chronic kidney disease, or unspecified chronic kidney disease: Secondary | ICD-10-CM | POA: Diagnosis not present

## 2022-12-19 DIAGNOSIS — Z9181 History of falling: Secondary | ICD-10-CM | POA: Diagnosis not present

## 2022-12-19 DIAGNOSIS — R2689 Other abnormalities of gait and mobility: Secondary | ICD-10-CM | POA: Diagnosis not present

## 2022-12-19 DIAGNOSIS — N183 Chronic kidney disease, stage 3 unspecified: Secondary | ICD-10-CM | POA: Diagnosis not present

## 2022-12-19 DIAGNOSIS — M797 Fibromyalgia: Secondary | ICD-10-CM | POA: Diagnosis not present

## 2022-12-19 DIAGNOSIS — Z741 Need for assistance with personal care: Secondary | ICD-10-CM | POA: Diagnosis not present

## 2022-12-19 DIAGNOSIS — I69351 Hemiplegia and hemiparesis following cerebral infarction affecting right dominant side: Secondary | ICD-10-CM | POA: Diagnosis not present

## 2022-12-19 DIAGNOSIS — R278 Other lack of coordination: Secondary | ICD-10-CM | POA: Diagnosis not present

## 2022-12-19 DIAGNOSIS — I129 Hypertensive chronic kidney disease with stage 1 through stage 4 chronic kidney disease, or unspecified chronic kidney disease: Secondary | ICD-10-CM | POA: Diagnosis not present

## 2022-12-19 DIAGNOSIS — M6281 Muscle weakness (generalized): Secondary | ICD-10-CM | POA: Diagnosis not present

## 2022-12-19 DIAGNOSIS — I10 Essential (primary) hypertension: Secondary | ICD-10-CM | POA: Diagnosis not present

## 2022-12-21 DIAGNOSIS — M6281 Muscle weakness (generalized): Secondary | ICD-10-CM | POA: Diagnosis not present

## 2022-12-21 DIAGNOSIS — I129 Hypertensive chronic kidney disease with stage 1 through stage 4 chronic kidney disease, or unspecified chronic kidney disease: Secondary | ICD-10-CM | POA: Diagnosis not present

## 2022-12-21 DIAGNOSIS — M797 Fibromyalgia: Secondary | ICD-10-CM | POA: Diagnosis not present

## 2022-12-21 DIAGNOSIS — I69351 Hemiplegia and hemiparesis following cerebral infarction affecting right dominant side: Secondary | ICD-10-CM | POA: Diagnosis not present

## 2022-12-21 DIAGNOSIS — I10 Essential (primary) hypertension: Secondary | ICD-10-CM | POA: Diagnosis not present

## 2022-12-21 DIAGNOSIS — N183 Chronic kidney disease, stage 3 unspecified: Secondary | ICD-10-CM | POA: Diagnosis not present

## 2022-12-22 DIAGNOSIS — I10 Essential (primary) hypertension: Secondary | ICD-10-CM | POA: Diagnosis not present

## 2022-12-22 LAB — CBC AND DIFFERENTIAL
HCT: 37 (ref 36–46)
Hemoglobin: 11.5 — AB (ref 12.0–16.0)
Neutrophils Absolute: 4122
Platelets: 170 10*3/uL (ref 150–400)
WBC: 6

## 2022-12-22 LAB — CBC: RBC: 4 (ref 3.87–5.11)

## 2022-12-23 DIAGNOSIS — N183 Chronic kidney disease, stage 3 unspecified: Secondary | ICD-10-CM | POA: Diagnosis not present

## 2022-12-23 DIAGNOSIS — I69351 Hemiplegia and hemiparesis following cerebral infarction affecting right dominant side: Secondary | ICD-10-CM | POA: Diagnosis not present

## 2022-12-23 DIAGNOSIS — I10 Essential (primary) hypertension: Secondary | ICD-10-CM | POA: Diagnosis not present

## 2022-12-23 DIAGNOSIS — I129 Hypertensive chronic kidney disease with stage 1 through stage 4 chronic kidney disease, or unspecified chronic kidney disease: Secondary | ICD-10-CM | POA: Diagnosis not present

## 2022-12-23 DIAGNOSIS — M6281 Muscle weakness (generalized): Secondary | ICD-10-CM | POA: Diagnosis not present

## 2022-12-23 DIAGNOSIS — M797 Fibromyalgia: Secondary | ICD-10-CM | POA: Diagnosis not present

## 2022-12-26 DIAGNOSIS — I129 Hypertensive chronic kidney disease with stage 1 through stage 4 chronic kidney disease, or unspecified chronic kidney disease: Secondary | ICD-10-CM | POA: Diagnosis not present

## 2022-12-26 DIAGNOSIS — I10 Essential (primary) hypertension: Secondary | ICD-10-CM | POA: Diagnosis not present

## 2022-12-26 DIAGNOSIS — N183 Chronic kidney disease, stage 3 unspecified: Secondary | ICD-10-CM | POA: Diagnosis not present

## 2022-12-26 DIAGNOSIS — M6281 Muscle weakness (generalized): Secondary | ICD-10-CM | POA: Diagnosis not present

## 2022-12-26 DIAGNOSIS — I69351 Hemiplegia and hemiparesis following cerebral infarction affecting right dominant side: Secondary | ICD-10-CM | POA: Diagnosis not present

## 2022-12-26 DIAGNOSIS — M797 Fibromyalgia: Secondary | ICD-10-CM | POA: Diagnosis not present

## 2022-12-27 DIAGNOSIS — I129 Hypertensive chronic kidney disease with stage 1 through stage 4 chronic kidney disease, or unspecified chronic kidney disease: Secondary | ICD-10-CM | POA: Diagnosis not present

## 2022-12-27 DIAGNOSIS — N183 Chronic kidney disease, stage 3 unspecified: Secondary | ICD-10-CM | POA: Diagnosis not present

## 2022-12-27 DIAGNOSIS — I10 Essential (primary) hypertension: Secondary | ICD-10-CM | POA: Diagnosis not present

## 2022-12-27 DIAGNOSIS — M6281 Muscle weakness (generalized): Secondary | ICD-10-CM | POA: Diagnosis not present

## 2022-12-27 DIAGNOSIS — M797 Fibromyalgia: Secondary | ICD-10-CM | POA: Diagnosis not present

## 2022-12-27 DIAGNOSIS — I69351 Hemiplegia and hemiparesis following cerebral infarction affecting right dominant side: Secondary | ICD-10-CM | POA: Diagnosis not present

## 2022-12-28 DIAGNOSIS — I10 Essential (primary) hypertension: Secondary | ICD-10-CM | POA: Diagnosis not present

## 2022-12-28 DIAGNOSIS — I129 Hypertensive chronic kidney disease with stage 1 through stage 4 chronic kidney disease, or unspecified chronic kidney disease: Secondary | ICD-10-CM | POA: Diagnosis not present

## 2022-12-28 DIAGNOSIS — I69351 Hemiplegia and hemiparesis following cerebral infarction affecting right dominant side: Secondary | ICD-10-CM | POA: Diagnosis not present

## 2022-12-28 DIAGNOSIS — N183 Chronic kidney disease, stage 3 unspecified: Secondary | ICD-10-CM | POA: Diagnosis not present

## 2022-12-28 DIAGNOSIS — M6281 Muscle weakness (generalized): Secondary | ICD-10-CM | POA: Diagnosis not present

## 2022-12-28 DIAGNOSIS — M797 Fibromyalgia: Secondary | ICD-10-CM | POA: Diagnosis not present

## 2022-12-30 DIAGNOSIS — N183 Chronic kidney disease, stage 3 unspecified: Secondary | ICD-10-CM | POA: Diagnosis not present

## 2022-12-30 DIAGNOSIS — M797 Fibromyalgia: Secondary | ICD-10-CM | POA: Diagnosis not present

## 2022-12-30 DIAGNOSIS — I129 Hypertensive chronic kidney disease with stage 1 through stage 4 chronic kidney disease, or unspecified chronic kidney disease: Secondary | ICD-10-CM | POA: Diagnosis not present

## 2022-12-30 DIAGNOSIS — I10 Essential (primary) hypertension: Secondary | ICD-10-CM | POA: Diagnosis not present

## 2022-12-30 DIAGNOSIS — I69351 Hemiplegia and hemiparesis following cerebral infarction affecting right dominant side: Secondary | ICD-10-CM | POA: Diagnosis not present

## 2022-12-30 DIAGNOSIS — M6281 Muscle weakness (generalized): Secondary | ICD-10-CM | POA: Diagnosis not present

## 2023-01-03 ENCOUNTER — Encounter: Payer: Self-pay | Admitting: Nurse Practitioner

## 2023-01-03 ENCOUNTER — Non-Acute Institutional Stay (SKILLED_NURSING_FACILITY): Payer: Self-pay | Admitting: Nurse Practitioner

## 2023-01-03 DIAGNOSIS — M797 Fibromyalgia: Secondary | ICD-10-CM | POA: Diagnosis not present

## 2023-01-03 DIAGNOSIS — I10 Essential (primary) hypertension: Secondary | ICD-10-CM

## 2023-01-03 DIAGNOSIS — M6281 Muscle weakness (generalized): Secondary | ICD-10-CM | POA: Diagnosis not present

## 2023-01-03 DIAGNOSIS — E039 Hypothyroidism, unspecified: Secondary | ICD-10-CM

## 2023-01-03 DIAGNOSIS — Z8673 Personal history of transient ischemic attack (TIA), and cerebral infarction without residual deficits: Secondary | ICD-10-CM | POA: Diagnosis not present

## 2023-01-03 DIAGNOSIS — N183 Chronic kidney disease, stage 3 unspecified: Secondary | ICD-10-CM | POA: Diagnosis not present

## 2023-01-03 DIAGNOSIS — K625 Hemorrhage of anus and rectum: Secondary | ICD-10-CM

## 2023-01-03 DIAGNOSIS — E559 Vitamin D deficiency, unspecified: Secondary | ICD-10-CM

## 2023-01-03 DIAGNOSIS — I129 Hypertensive chronic kidney disease with stage 1 through stage 4 chronic kidney disease, or unspecified chronic kidney disease: Secondary | ICD-10-CM | POA: Diagnosis not present

## 2023-01-03 DIAGNOSIS — R0781 Pleurodynia: Secondary | ICD-10-CM

## 2023-01-03 DIAGNOSIS — K219 Gastro-esophageal reflux disease without esophagitis: Secondary | ICD-10-CM | POA: Diagnosis not present

## 2023-01-03 DIAGNOSIS — I69351 Hemiplegia and hemiparesis following cerebral infarction affecting right dominant side: Secondary | ICD-10-CM | POA: Diagnosis not present

## 2023-01-03 NOTE — Progress Notes (Signed)
Location:  Other [place].  Nursing Home Room Number: [facility] 417A Place of Service:  SNF [phone]) Kaitlyn Chatters, NP  PCP: Earnestine Mealing, MD  Patient Care Team: Earnestine Mealing, MD as PCP - General [facility])  Extended Emergency Contact Information Primary Emergency Contact: Fayette Pho Address: [address]          [place], [place] [address] [place] of [place] Home Phone: [phone] Relation: Daughter Secondary Emergency Contact: Satira Sark [place] of [place] Home Phone: [phone] Mobile Phone: [phone] Relation: Son  Goals of care: Advanced Directive information    01/03/2023    2:53 PM  Advanced Directives  Does Patient Have a Medical Advance Directive? Yes  Type of Advance Directive Out of facility DNR (pink MOST or yellow form)  Does patient want to make changes to medical advance directive? No - Patient declined     Chief Complaint  Patient presents with   Medical Management of Chronic Issues    Medical Management of Chronic Issues.     HPI:  Pt is a 89 y.o. female seen today for medical management of chronic disease.   Pt is doing well. She has had some ongoing weight loss over the last 6 months, stable over the last month- on medpass at supper and on Remeron for appetite at bedtime.   Staff has no concerns. Reports she does not complain of pain.   Sleeping well. Mood has been stable.    Past Medical History:  Diagnosis Date   Cervicalgia    Depression    Essential hypertension    Hemorrhoids    History of MRSA infection    Hyperlipidemia    Hypertension    Hypothyroidism    IBS (irritable bowel syndrome)    Osteoarthritis of multiple joints    Restless leg    Senile osteoporosis    Shingles    Sleep apnea    Ulcerative colitis (HCC)    Urge incontinence    Past Surgical History:  Procedure Laterality Date   ABDOMINAL HYSTERECTOMY  1980   cataract surgery     CHOLECYSTECTOMY  1955    colon polyp removal  12/2012   ESOPHAGOGASTRODUODENOSCOPY  2014   makoplasty Left 06/2012   parotid gland removal  1984   VISCERAL ANGIOGRAPHY N/A 09/13/2017   Procedure: VISCERAL ANGIOGRAPHY;  Surgeon: Renford Dills, MD;  Location: [facility] INVASIVE CV LAB;  Service: Cardiovascular;  Laterality: N/A;    Allergies  Allergen Reactions   Codeine Sulfate Hives and Itching    Can take tramadol   Codeine Other (See Comments)    Outpatient Encounter Medications as of 01/03/2023  Medication Sig   acetaminophen (TYLENOL) 500 MG tablet Take 1,000 mg by mouth 3 (three) times daily.   atorvastatin (LIPITOR) 40 MG tablet Take 1 tablet (40 mg total) by mouth daily at 6 PM.   Calcium Carbonate-Vit D-Min (CALCIUM 600+D PLUS MINERALS) 600-400 MG-UNIT TABS Take 1 tablet by mouth daily.   Cholecalciferol (D3-50) 50000 units capsule Take 50,000 Units by mouth every 30 (thirty) days.   cyanocobalamin 1000 MCG tablet Take 1,000 mcg by mouth daily.   DULoxetine (CYMBALTA) 30 MG capsule Take 30 mg by mouth daily.   ferrous sulfate 300 (60 Fe) MG/5ML syrup Take 7.4 mLs by mouth daily.   levothyroxine (SYNTHROID, LEVOTHROID) 50 MCG tablet Take 50 mcg by mouth daily before breakfast.   lidocaine (ASPERCREME LIDOCAINE) 4 % Place 1 patch onto the skin daily. As needed for pain  losartan (COZAAR) 50 MG tablet Take 50 mg by mouth daily.    magnesium hydroxide (MILK OF MAGNESIA) 400 MG/5ML suspension Take 30 mLs by mouth every 12 (twelve) hours as needed for mild constipation.   mirtazapine (REMERON) 7.5 MG tablet Take 7.5 mg by mouth at bedtime.   Multiple Vitamins-Minerals (MULTIVITAMIN ADULT PO) Take 1 tablet by mouth daily.    polyethylene glycol (MIRALAX / GLYCOLAX) 17 g packet Take 17 g by mouth daily as needed.   Zinc Oxide (TRIPLE PASTE) 12.8 % ointment Apply 1 Application topically. Twice daily.   [DISCONTINUED] docusate sodium (COLACE) 100 MG capsule Take 100 mg by mouth daily.   [DISCONTINUED]  fluticasone (FLONASE) 50 MCG/ACT nasal spray Place 2 sprays into both nostrils daily.   [DISCONTINUED] pantoprazole sodium (PROTONIX) 40 mg Take 40 mg by mouth 2 (two) times daily.   No facility-administered encounter medications on file as of 01/03/2023.    Review of Systems  Unable to perform ROS: Dementia     Immunization History  Administered Date(s) Administered   Covid-19, Mrna,Vaccine(Spikevax)69yrs and older 07/26/2022   Influenza, High Dose Seasonal PF 04/05/2017   Influenza-Unspecified 02/04/2020, 02/01/2021, 02/01/2022   MODERNA COVID-19 SARS-COV-2 PEDS BIVALENT BOOSTER 53yr-41yr 04/29/2019, 05/27/2019, 02/25/2022   Moderna SARS-COV2 Booster Vaccination 09/04/2020   Pfizer Covid-19 Vaccine Bivalent Booster 69yrs & up 02/28/2020, 01/08/2021   Pneumococcal Conjugate-13 03/07/2018   Pneumococcal Polysaccharide-23 03/11/2019   Tdap 09/14/2010   Pertinent  Health Maintenance Due  Topic Date Due   INFLUENZA VACCINE  11/17/2022   DEXA SCAN  Discontinued      06/10/2022    2:55 PM 11/10/2022   11:48 AM  Fall Risk  Falls in the past year? 0 1  Was there an injury with Fall? 0 1  Fall Risk Category Calculator 0 3  Patient at Risk for Falls Due to No Fall Risks History of fall(s);Impaired balance/gait;Impaired mobility  Fall risk Follow up Falls evaluation completed Falls evaluation completed;Education provided   Functional Status Survey:    Vitals:   01/03/23 1444 01/03/23 1457  BP: (!) 145/86 136/79  Pulse: 76   Resp: 18   Temp: 98 F (36.7 C)   SpO2: 95%   Weight: 140 lb 3.2 oz (63.6 kg)   Height: 5\' 3"  (1.6 m)    Body mass index is 24.84 kg/m. Physical Exam Constitutional:      General: She is not in acute distress.    Appearance: She is well-developed. She is not diaphoretic.  HENT:     Head: Normocephalic and atraumatic.     Mouth/Throat:     Pharynx: No oropharyngeal exudate.  Eyes:     Conjunctiva/sclera: Conjunctivae normal.     Pupils: Pupils are  equal, round, and reactive to light.  Cardiovascular:     Rate and Rhythm: Normal rate and regular rhythm.     Heart sounds: Normal heart sounds.  Pulmonary:     Effort: Pulmonary effort is normal.     Breath sounds: Normal breath sounds. No rales.  Abdominal:     General: Bowel sounds are normal.     Palpations: Abdomen is soft.  Musculoskeletal:     Cervical back: Normal range of motion and neck supple.     Right lower leg: No edema.     Left lower leg: No edema.  Skin:    General: Skin is warm and dry.  Neurological:     Mental Status: She is alert. Mental status is at baseline.  Motor: Weakness present.     Gait: Gait abnormal.  Psychiatric:        Mood and Affect: Mood normal.     Labs reviewed: Recent Labs    06/13/22 0000 10/27/22 0000 11/07/22 0000  NA 136* 139 139  K 4.1 3.9 4.2  CL 103 105 105  CO2 25* 27* 29*  BUN 20 13 17   CREATININE 1.0 1.0 1.0  CALCIUM 8.6* 8.7 8.7   Recent Labs    02/17/22 0000  AST 14  ALT 10  ALKPHOS 51  ALBUMIN 3.6   Recent Labs    10/27/22 0000 11/04/22 0000 11/14/22 0000 11/17/22 0000 12/22/22 0000  WBC 6.3   < > 5.9 4.9 6.0  NEUTROABS 4,171.00  --   --  3,038.00 4,122.00  HGB 12.4   < > 9.6* 10.0* 11.5*  HCT 38   < > 30* 31* 37  PLT 147*   < > 173 166 170   < > = values in this interval not displayed.   Lab Results  Component Value Date   TSH 2.04 06/13/2022   Lab Results  Component Value Date   HGBA1C 5.8 (H) 04/04/2017   Lab Results  Component Value Date   CHOL 138 02/17/2022   HDL 41 02/17/2022   LDLCALC 74 02/17/2022   TRIG 156 02/17/2022   CHOLHDL 6.3 04/04/2017    Significant Diagnostic Results in last 30 days:  No results found.  Assessment/Plan 1. Rectal bleed No signs of recent blood loss, hgb trending up Continues on iron supplement  2. History of CVA (cerebrovascular accident) Stable, continues on lipitor. Not on asa due to rectal bleed  3. Primary hypertension Blood pressure  well controlled, goal bp <140/90 Continue current medications and dietary modifications follow metabolic panel  5. Adult hypothyroidism TSH at goal on last labs.   6. Gastroesophageal reflux disease without esophagitis No sings or worsening GERD.   7. Vitamin D deficiency Continues on supplement, will get vit d level  8. Rib pain on right side Improved, will decrease tylenol to BID>     Kaitlyn Aho K. Biagio Borg [facility] [phone]

## 2023-01-06 DIAGNOSIS — M6281 Muscle weakness (generalized): Secondary | ICD-10-CM | POA: Diagnosis not present

## 2023-01-06 DIAGNOSIS — M797 Fibromyalgia: Secondary | ICD-10-CM | POA: Diagnosis not present

## 2023-01-06 DIAGNOSIS — I129 Hypertensive chronic kidney disease with stage 1 through stage 4 chronic kidney disease, or unspecified chronic kidney disease: Secondary | ICD-10-CM | POA: Diagnosis not present

## 2023-01-06 DIAGNOSIS — N183 Chronic kidney disease, stage 3 unspecified: Secondary | ICD-10-CM | POA: Diagnosis not present

## 2023-01-06 DIAGNOSIS — I69351 Hemiplegia and hemiparesis following cerebral infarction affecting right dominant side: Secondary | ICD-10-CM | POA: Diagnosis not present

## 2023-01-06 DIAGNOSIS — I10 Essential (primary) hypertension: Secondary | ICD-10-CM | POA: Diagnosis not present

## 2023-01-11 DIAGNOSIS — M797 Fibromyalgia: Secondary | ICD-10-CM | POA: Diagnosis not present

## 2023-01-11 DIAGNOSIS — N183 Chronic kidney disease, stage 3 unspecified: Secondary | ICD-10-CM | POA: Diagnosis not present

## 2023-01-11 DIAGNOSIS — I10 Essential (primary) hypertension: Secondary | ICD-10-CM | POA: Diagnosis not present

## 2023-01-11 DIAGNOSIS — M6281 Muscle weakness (generalized): Secondary | ICD-10-CM | POA: Diagnosis not present

## 2023-01-11 DIAGNOSIS — I129 Hypertensive chronic kidney disease with stage 1 through stage 4 chronic kidney disease, or unspecified chronic kidney disease: Secondary | ICD-10-CM | POA: Diagnosis not present

## 2023-01-11 DIAGNOSIS — I69351 Hemiplegia and hemiparesis following cerebral infarction affecting right dominant side: Secondary | ICD-10-CM | POA: Diagnosis not present

## 2023-01-12 DIAGNOSIS — N183 Chronic kidney disease, stage 3 unspecified: Secondary | ICD-10-CM | POA: Diagnosis not present

## 2023-01-12 DIAGNOSIS — I129 Hypertensive chronic kidney disease with stage 1 through stage 4 chronic kidney disease, or unspecified chronic kidney disease: Secondary | ICD-10-CM | POA: Diagnosis not present

## 2023-01-12 DIAGNOSIS — I10 Essential (primary) hypertension: Secondary | ICD-10-CM | POA: Diagnosis not present

## 2023-01-12 DIAGNOSIS — I69351 Hemiplegia and hemiparesis following cerebral infarction affecting right dominant side: Secondary | ICD-10-CM | POA: Diagnosis not present

## 2023-01-12 DIAGNOSIS — M797 Fibromyalgia: Secondary | ICD-10-CM | POA: Diagnosis not present

## 2023-01-12 DIAGNOSIS — M6281 Muscle weakness (generalized): Secondary | ICD-10-CM | POA: Diagnosis not present

## 2023-01-13 DIAGNOSIS — Z23 Encounter for immunization: Secondary | ICD-10-CM | POA: Diagnosis not present

## 2023-01-16 DIAGNOSIS — N183 Chronic kidney disease, stage 3 unspecified: Secondary | ICD-10-CM | POA: Diagnosis not present

## 2023-01-16 DIAGNOSIS — I69351 Hemiplegia and hemiparesis following cerebral infarction affecting right dominant side: Secondary | ICD-10-CM | POA: Diagnosis not present

## 2023-01-16 DIAGNOSIS — M6281 Muscle weakness (generalized): Secondary | ICD-10-CM | POA: Diagnosis not present

## 2023-01-16 DIAGNOSIS — I10 Essential (primary) hypertension: Secondary | ICD-10-CM | POA: Diagnosis not present

## 2023-01-16 DIAGNOSIS — M797 Fibromyalgia: Secondary | ICD-10-CM | POA: Diagnosis not present

## 2023-01-16 DIAGNOSIS — I129 Hypertensive chronic kidney disease with stage 1 through stage 4 chronic kidney disease, or unspecified chronic kidney disease: Secondary | ICD-10-CM | POA: Diagnosis not present

## 2023-01-19 DIAGNOSIS — I10 Essential (primary) hypertension: Secondary | ICD-10-CM | POA: Diagnosis not present

## 2023-01-19 LAB — CBC AND DIFFERENTIAL
HCT: 36 (ref 36–46)
Hemoglobin: 11.4 — AB (ref 12.0–16.0)
Neutrophils Absolute: 4750
Platelets: 141 10*3/uL — AB (ref 150–400)
WBC: 6.7

## 2023-01-19 LAB — CBC: RBC: 3.94 (ref 3.87–5.11)

## 2023-01-23 DIAGNOSIS — M6281 Muscle weakness (generalized): Secondary | ICD-10-CM | POA: Diagnosis not present

## 2023-01-23 DIAGNOSIS — M797 Fibromyalgia: Secondary | ICD-10-CM | POA: Diagnosis not present

## 2023-01-23 DIAGNOSIS — I10 Essential (primary) hypertension: Secondary | ICD-10-CM | POA: Diagnosis not present

## 2023-01-23 DIAGNOSIS — I69351 Hemiplegia and hemiparesis following cerebral infarction affecting right dominant side: Secondary | ICD-10-CM | POA: Diagnosis not present

## 2023-01-23 DIAGNOSIS — I129 Hypertensive chronic kidney disease with stage 1 through stage 4 chronic kidney disease, or unspecified chronic kidney disease: Secondary | ICD-10-CM | POA: Diagnosis not present

## 2023-01-23 DIAGNOSIS — R278 Other lack of coordination: Secondary | ICD-10-CM | POA: Diagnosis not present

## 2023-01-23 DIAGNOSIS — R2689 Other abnormalities of gait and mobility: Secondary | ICD-10-CM | POA: Diagnosis not present

## 2023-01-23 DIAGNOSIS — Z9181 History of falling: Secondary | ICD-10-CM | POA: Diagnosis not present

## 2023-01-23 DIAGNOSIS — N183 Chronic kidney disease, stage 3 unspecified: Secondary | ICD-10-CM | POA: Diagnosis not present

## 2023-01-23 DIAGNOSIS — Z741 Need for assistance with personal care: Secondary | ICD-10-CM | POA: Diagnosis not present

## 2023-02-20 DIAGNOSIS — E039 Hypothyroidism, unspecified: Secondary | ICD-10-CM | POA: Diagnosis not present

## 2023-02-20 DIAGNOSIS — E559 Vitamin D deficiency, unspecified: Secondary | ICD-10-CM | POA: Diagnosis not present

## 2023-02-20 DIAGNOSIS — D539 Nutritional anemia, unspecified: Secondary | ICD-10-CM | POA: Diagnosis not present

## 2023-02-20 DIAGNOSIS — E785 Hyperlipidemia, unspecified: Secondary | ICD-10-CM | POA: Diagnosis not present

## 2023-02-20 DIAGNOSIS — I10 Essential (primary) hypertension: Secondary | ICD-10-CM | POA: Diagnosis not present

## 2023-02-20 LAB — LIPID PANEL
Cholesterol: 130 (ref 0–200)
HDL: 43 (ref 35–70)
LDL Cholesterol: 63
Triglycerides: 165 — AB (ref 40–160)

## 2023-02-20 LAB — BASIC METABOLIC PANEL
BUN: 24 — AB (ref 4–21)
CO2: 29 — AB (ref 13–22)
Chloride: 105 (ref 99–108)
Creatinine: 1 (ref 0.5–1.1)
Glucose: 90
Potassium: 4.4 meq/L (ref 3.5–5.1)
Sodium: 138 (ref 137–147)

## 2023-02-20 LAB — CBC AND DIFFERENTIAL
HCT: 38 (ref 36–46)
Hemoglobin: 11.9 — AB (ref 12.0–16.0)
Neutrophils Absolute: 3739
Platelets: 164 10*3/uL (ref 150–400)
WBC: 6.1

## 2023-02-20 LAB — COMPREHENSIVE METABOLIC PANEL WITH GFR
Albumin: 3.6 (ref 3.5–5.0)
Calcium: 9 (ref 8.7–10.7)
Globulin: 2.7
eGFR: 51

## 2023-02-20 LAB — HEPATIC FUNCTION PANEL
ALT: 11 U/L (ref 7–35)
AST: 14 (ref 13–35)
Alkaline Phosphatase: 45 (ref 25–125)

## 2023-02-20 LAB — CBC: RBC: 4.18 (ref 3.87–5.11)

## 2023-02-20 LAB — TSH: TSH: 1.4 (ref 0.41–5.90)

## 2023-02-20 LAB — VITAMIN D 25 HYDROXY (VIT D DEFICIENCY, FRACTURES): Vit D, 25-Hydroxy: 68

## 2023-03-13 ENCOUNTER — Non-Acute Institutional Stay (SKILLED_NURSING_FACILITY): Payer: Medicare Other | Admitting: Student

## 2023-03-13 DIAGNOSIS — E039 Hypothyroidism, unspecified: Secondary | ICD-10-CM | POA: Diagnosis not present

## 2023-03-13 DIAGNOSIS — K625 Hemorrhage of anus and rectum: Secondary | ICD-10-CM

## 2023-03-13 DIAGNOSIS — I69390 Apraxia following cerebral infarction: Secondary | ICD-10-CM

## 2023-03-13 DIAGNOSIS — I10 Essential (primary) hypertension: Secondary | ICD-10-CM | POA: Diagnosis not present

## 2023-03-13 DIAGNOSIS — F39 Unspecified mood [affective] disorder: Secondary | ICD-10-CM | POA: Diagnosis not present

## 2023-03-13 NOTE — Progress Notes (Unsigned)
Location:  Other Nursing Home Room Number: [employer] of Service:  SNF [phone]) Provider:  Ander Gaster, Benetta Spar, MD  Patient Care Team: Earnestine Mealing, MD as PCP - General [facility])  Extended Emergency Contact Information Primary Emergency Contact: Fayette Pho Address: [address]          [place], [place] [address] [place] of [place] Home Phone: [phone] Relation: Daughter Secondary Emergency Contact: Satira Sark [place] Home Phone: [phone] Mobile Phone: [phone] Relation: Son  Code Status:  DNR Goals of care: Advanced Directive information    01/03/2023    2:53 PM  Advanced Directives  Does Patient Have a Medical Advance Directive? Yes  Type of Advance Directive Out of facility DNR (pink MOST or yellow form)  Does patient want to make changes to medical advance directive? No - Patient declined     Chief Complaint  Patient presents with   Medical Managment of Chronic Conditions    HPI:  Pt is a 89 y.o. female seen today for medical management of chronic diseases.   Patient states, "Last night I had a dream of sacroiliac" she also says her daughter's name is Samara Deist and she has another friend named flora. She continues saying phrases about a florist. When asked direct questions, she does answer appropriately, however, accuracy is unclear.   Nursing without acute concerns at this time.   Briefly spoke with daughter who states she is taking things one day at a time.   Past Medical History:  Diagnosis Date   Cervicalgia    Depression    Essential hypertension    Hemorrhoids    History of MRSA infection    Hyperlipidemia    Hypertension    Hypothyroidism    IBS (irritable bowel syndrome)    Osteoarthritis of multiple joints    Restless leg    Senile osteoporosis    Shingles    Sleep apnea    Ulcerative colitis (HCC)    Urge incontinence    Past Surgical History:  Procedure Laterality Date    ABDOMINAL HYSTERECTOMY  1980   cataract surgery     CHOLECYSTECTOMY  1955   colon polyp removal  12/2012   ESOPHAGOGASTRODUODENOSCOPY  2014   makoplasty Left 06/2012   parotid gland removal  1984   VISCERAL ANGIOGRAPHY N/A 09/13/2017   Procedure: VISCERAL ANGIOGRAPHY;  Surgeon: Renford Dills, MD;  Location: [facility] INVASIVE CV LAB;  Service: Cardiovascular;  Laterality: N/A;    Allergies  Allergen Reactions   Codeine Sulfate Hives and Itching    Can take tramadol   Codeine Other (See Comments)    Outpatient Encounter Medications as of 03/13/2023  Medication Sig   acetaminophen (TYLENOL) 500 MG tablet Take 1,000 mg by mouth 3 (three) times daily.   atorvastatin (LIPITOR) 40 MG tablet Take 1 tablet (40 mg total) by mouth daily at 6 PM.   Calcium Carbonate-Vit D-Min (CALCIUM 600+D PLUS MINERALS) 600-400 MG-UNIT TABS Take 1 tablet by mouth daily.   Cholecalciferol (D3-50) 50000 units capsule Take 50,000 Units by mouth every 30 (thirty) days.   cyanocobalamin 1000 MCG tablet Take 1,000 mcg by mouth daily.   DULoxetine (CYMBALTA) 30 MG capsule Take 30 mg by mouth daily.   ferrous sulfate 300 (60 Fe) MG/5ML syrup Take 7.4 mLs by mouth daily.   levothyroxine (SYNTHROID, LEVOTHROID) 50 MCG tablet Take 50 mcg by mouth daily before breakfast.   lidocaine (ASPERCREME LIDOCAINE) 4 % Place 1 patch onto the skin daily.  As needed for pain   losartan (COZAAR) 50 MG tablet Take 50 mg by mouth daily.    magnesium hydroxide (MILK OF MAGNESIA) 400 MG/5ML suspension Take 30 mLs by mouth every 12 (twelve) hours as needed for mild constipation.   mirtazapine (REMERON) 7.5 MG tablet Take 7.5 mg by mouth at bedtime.   Multiple Vitamins-Minerals (MULTIVITAMIN ADULT PO) Take 1 tablet by mouth daily.    polyethylene glycol (MIRALAX / GLYCOLAX) 17 g packet Take 17 g by mouth daily as needed.   Zinc Oxide (TRIPLE PASTE) 12.8 % ointment Apply 1 Application topically. Twice daily.   No facility-administered  encounter medications on file as of 03/13/2023.    Review of Systems  Immunization History  Administered Date(s) Administered   Influenza, High Dose Seasonal PF 04/05/2017   Influenza-Unspecified 02/04/2020, 02/01/2021, 02/01/2022   MODERNA COVID-19 SARS-COV-2 PEDS BIVALENT BOOSTER 10yr-1yr 04/29/2019, 05/27/2019, 02/25/2022   Moderna Covid-19 Fall Seasonal Vaccine 52yrs & older 07/26/2022   Moderna SARS-COV2 Booster Vaccination 09/04/2020   Pfizer Covid-19 Vaccine Bivalent Booster 27yrs & up 02/28/2020, 01/08/2021   Pneumococcal Conjugate-13 03/07/2018   Pneumococcal Polysaccharide-23 03/11/2019   Tdap 09/14/2010   Pertinent  Health Maintenance Due  Topic Date Due   INFLUENZA VACCINE  11/17/2022   DEXA SCAN  Discontinued      06/10/2022    2:55 PM 11/10/2022   11:48 AM  Fall Risk  Falls in the past year? 0 1  Was there an injury with Fall? 0 1  Fall Risk Category Calculator 0 3  Patient at Risk for Falls Due to No Fall Risks History of fall(s);Impaired balance/gait;Impaired mobility  Fall risk Follow up Falls evaluation completed Falls evaluation completed;Education provided   Functional Status Survey:    Vitals:   03/14/23 2146  BP: (!) 143/76  Pulse: 63  Resp: 18  Temp: (!) 97.5 F (36.4 C)  Weight: 143 lb (64.9 kg)   Body mass index is 25.33 kg/m. Physical Exam Constitutional:      Comments: Icecream stains on face and clothing  Cardiovascular:     Rate and Rhythm: Normal rate.     Pulses: Normal pulses.     Heart sounds: Normal heart sounds.  Pulmonary:     Effort: Pulmonary effort is normal.     Breath sounds: Normal breath sounds.  Abdominal:     General: Bowel sounds are normal.  Neurological:     Mental Status: She is alert. Mental status is at baseline. She is disoriented.     Labs reviewed: Recent Labs    06/13/22 0000 10/27/22 0000 11/07/22 0000  NA 136* 139 139  K 4.1 3.9 4.2  CL 103 105 105  CO2 25* 27* 29*  BUN 20 13 17    CREATININE 1.0 1.0 1.0  CALCIUM 8.6* 8.7 8.7   No results for input(s): "AST", "ALT", "ALKPHOS", "BILITOT", "PROT", "ALBUMIN" in the last 8760 hours. Recent Labs    10/27/22 0000 11/04/22 0000 11/14/22 0000 11/17/22 0000 12/22/22 0000  WBC 6.3   < > 5.9 4.9 6.0  NEUTROABS 4,171.00  --   --  3,038.00 4,122.00  HGB 12.4   < > 9.6* 10.0* 11.5*  HCT 38   < > 30* 31* 37  PLT 147*   < > 173 166 170   < > = values in this interval not displayed.   Lab Results  Component Value Date   TSH 2.04 06/13/2022   Lab Results  Component Value Date   HGBA1C 5.8 (  H) 04/04/2017   Lab Results  Component Value Date   CHOL 138 02/17/2022   HDL 41 02/17/2022   LDLCALC 74 02/17/2022   TRIG 156 02/17/2022   CHOLHDL 6.3 04/04/2017    Significant Diagnostic Results in last 30 days:  No results found.  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 Nonsensical conversation today. Concern for continued cognitive decline. No recent episodes of blood in stool. BP improved at current dosing of losartan. Continue protonix given hx of GIB, consider discontinuing at 9mo mark. Thyroid well-controlled on current dosing of levothyroxine. Iron given hx of ABLA. Mood stable, however, apparent frustration with speech. Consider discontinuing mirtazapine at follow up given stable weight. Continue cymbalta.   Family/ staff Communication: nursing  Labs/tests ordered:  d/c CBC, normal levels without recurrence of GIB.

## 2023-03-14 ENCOUNTER — Encounter: Payer: Self-pay | Admitting: Student

## 2023-04-06 DIAGNOSIS — Z741 Need for assistance with personal care: Secondary | ICD-10-CM | POA: Diagnosis not present

## 2023-04-06 DIAGNOSIS — F0150 Vascular dementia without behavioral disturbance: Secondary | ICD-10-CM | POA: Diagnosis not present

## 2023-04-06 DIAGNOSIS — M6281 Muscle weakness (generalized): Secondary | ICD-10-CM | POA: Diagnosis not present

## 2023-04-06 DIAGNOSIS — Z9181 History of falling: Secondary | ICD-10-CM | POA: Diagnosis not present

## 2023-04-07 DIAGNOSIS — F0150 Vascular dementia without behavioral disturbance: Secondary | ICD-10-CM | POA: Diagnosis not present

## 2023-04-07 DIAGNOSIS — Z9181 History of falling: Secondary | ICD-10-CM | POA: Diagnosis not present

## 2023-04-07 DIAGNOSIS — M6281 Muscle weakness (generalized): Secondary | ICD-10-CM | POA: Diagnosis not present

## 2023-04-07 DIAGNOSIS — Z741 Need for assistance with personal care: Secondary | ICD-10-CM | POA: Diagnosis not present

## 2023-04-10 ENCOUNTER — Encounter: Payer: Self-pay | Admitting: Adult Health

## 2023-04-10 ENCOUNTER — Non-Acute Institutional Stay (SKILLED_NURSING_FACILITY): Payer: Self-pay | Admitting: Adult Health

## 2023-04-10 DIAGNOSIS — R531 Weakness: Secondary | ICD-10-CM

## 2023-04-10 DIAGNOSIS — I69390 Apraxia following cerebral infarction: Secondary | ICD-10-CM | POA: Diagnosis not present

## 2023-04-10 DIAGNOSIS — Z9181 History of falling: Secondary | ICD-10-CM | POA: Diagnosis not present

## 2023-04-10 DIAGNOSIS — F39 Unspecified mood [affective] disorder: Secondary | ICD-10-CM

## 2023-04-10 DIAGNOSIS — I10 Essential (primary) hypertension: Secondary | ICD-10-CM

## 2023-04-10 DIAGNOSIS — Z741 Need for assistance with personal care: Secondary | ICD-10-CM | POA: Diagnosis not present

## 2023-04-10 DIAGNOSIS — M6281 Muscle weakness (generalized): Secondary | ICD-10-CM | POA: Diagnosis not present

## 2023-04-10 DIAGNOSIS — F0150 Vascular dementia without behavioral disturbance: Secondary | ICD-10-CM | POA: Diagnosis not present

## 2023-04-10 LAB — CBC AND DIFFERENTIAL
HCT: 41 (ref 36–46)
Hemoglobin: 12.9 (ref 12.0–16.0)
Platelets: 88 10*3/uL — AB (ref 150–400)
WBC: 7

## 2023-04-10 LAB — BASIC METABOLIC PANEL
BUN: 19 (ref 4–21)
CO2: 27 — AB (ref 13–22)
Chloride: 6 — AB (ref 99–108)
Creatinine: 1 (ref 0.5–1.1)
Glucose: 116
Potassium: 4 meq/L (ref 3.5–5.1)
Sodium: 137 (ref 137–147)

## 2023-04-10 LAB — COMPREHENSIVE METABOLIC PANEL
Calcium: 8.9 (ref 8.7–10.7)
eGFR: 53

## 2023-04-10 LAB — CBC: RBC: 4.61 (ref 3.87–5.11)

## 2023-04-10 NOTE — Progress Notes (Unsigned)
Location:  Other [facility]) Nursing Home Room Number: [facility]) Place of Service:  SNF (31) Provider:  Kenard Gower, DNP, FNP-BC  Patient Care Team: Earnestine Mealing, MD as PCP - General (Family Medicine)  Extended Emergency Contact Information Primary Emergency Contact: Fayette Pho Address: [address]          [place], [place] [address] [place] of [place] Home Phone: [phone] Relation: Daughter Secondary Emergency Contact: Satira Sark [place] Home Phone: [phone] Mobile Phone: [phone] Relation: Son  Code Status:  DNR  Goals of care: Advanced Directive information    04/10/2023    3:37 PM  Advanced Directives  Does Patient Have a Medical Advance Directive? Yes  Type of Advance Directive Out of facility DNR (pink MOST or yellow form)  Does patient want to make changes to medical advance directive? No - Patient declined  Pre-existing out of facility DNR order (yellow form or pink MOST form) Yellow form placed in chart (order not valid for inpatient use)     Chief Complaint  Patient presents with   Acute Visit    doesn't want to open eyes    HPI:  Pt is a 89 y.o. female seen today for medical management of chronic diseases.  ***   Past Medical History:  Diagnosis Date   Cervicalgia    Depression    Essential hypertension    Hemorrhoids    History of MRSA infection    Hyperlipidemia    Hypertension    Hypothyroidism    IBS (irritable bowel syndrome)    Osteoarthritis of multiple joints    Restless leg    Senile osteoporosis    Shingles    Sleep apnea    Ulcerative colitis (HCC)    Urge incontinence    Past Surgical History:  Procedure Laterality Date   ABDOMINAL HYSTERECTOMY  1980   cataract surgery     CHOLECYSTECTOMY  1955   colon polyp removal  12/2012   ESOPHAGOGASTRODUODENOSCOPY  2014   makoplasty Left 06/2012   parotid gland removal  1984   VISCERAL  ANGIOGRAPHY N/A 09/13/2017   Procedure: VISCERAL ANGIOGRAPHY;  Surgeon: Renford Dills, MD;  Location: [facility] INVASIVE CV LAB;  Service: Cardiovascular;  Laterality: N/A;    Allergies  Allergen Reactions   Codeine Sulfate Hives and Itching    Can take tramadol   Codeine Other (See Comments)    Outpatient Encounter Medications as of 04/10/2023  Medication Sig   acetaminophen (TYLENOL) 500 MG tablet Take 1,000 mg by mouth 3 (three) times daily.   alum & mag hydroxide-simeth (MAALOX/MYLANTA) 200-200-20 MG/5ML suspension Take 30 mLs by mouth every 4 (four) hours as needed for indigestion or heartburn.   atorvastatin (LIPITOR) 40 MG tablet Take 1 tablet (40 mg total) by mouth daily at 6 PM.   Calcium Carbonate-Vit D-Min (CALCIUM 600+D PLUS MINERALS) 600-400 MG-UNIT TABS Take 1 tablet by mouth daily.   Cholecalciferol (D3-50) 50000 units capsule Take 50,000 Units by mouth every 30 (thirty) days.   cyanocobalamin 1000 MCG tablet Take 1,000 mcg by mouth daily.   DULoxetine (CYMBALTA) 30 MG capsule Take 30 mg by mouth daily.   levothyroxine (SYNTHROID, LEVOTHROID) 50 MCG tablet Take 50 mcg by mouth daily before breakfast.   losartan (COZAAR) 50 MG tablet Take 50 mg by mouth daily.    mirtazapine (REMERON) 7.5 MG tablet Take 7.5 mg by mouth at bedtime.   Multiple Vitamins-Minerals (MULTIVITAMIN ADULT PO) Take 1 tablet by  mouth daily.    Zinc Oxide (TRIPLE PASTE) 12.8 % ointment Apply 1 Application topically. Twice daily.   ferrous sulfate 300 (60 Fe) MG/5ML syrup Take 7.4 mLs by mouth daily. (Patient not taking: Reported on 04/10/2023)   lidocaine (ASPERCREME LIDOCAINE) 4 % Place 1 patch onto the skin daily. As needed for pain (Patient not taking: Reported on 04/10/2023)   magnesium hydroxide (MILK OF MAGNESIA) 400 MG/5ML suspension Take 30 mLs by mouth every 12 (twelve) hours as needed for mild constipation. (Patient not taking: Reported on 04/10/2023)   pantoprazole (PROTONIX) 40 MG tablet Take  40 mg by mouth 2 (two) times daily. (Patient not taking: Reported on 04/10/2023)   polyethylene glycol (MIRALAX / GLYCOLAX) 17 g packet Take 17 g by mouth daily as needed. (Patient not taking: Reported on 04/10/2023)   No facility-administered encounter medications on file as of 04/10/2023.    Review of Systems ***    Immunization History  Administered Date(s) Administered   Influenza, High Dose Seasonal PF 04/05/2017   Influenza-Unspecified 02/04/2020, 02/01/2021, 02/01/2022   MODERNA COVID-19 SARS-COV-2 PEDS BIVALENT BOOSTER 86yr-8yr 04/29/2019, 05/27/2019, 02/25/2022   Moderna Covid-19 Fall Seasonal Vaccine 52yrs & older 07/26/2022   Moderna SARS-COV2 Booster Vaccination 09/04/2020   Pfizer Covid-19 Vaccine Bivalent Booster 36yrs & up 02/28/2020, 01/08/2021   Pneumococcal Conjugate-13 03/07/2018   Pneumococcal Polysaccharide-23 03/11/2019   Tdap 09/14/2010   Pertinent  Health Maintenance Due  Topic Date Due   INFLUENZA VACCINE  11/17/2022   DEXA SCAN  Discontinued      06/10/2022    2:55 PM 11/10/2022   11:48 AM  Fall Risk  Falls in the past year? 0 1  Was there an injury with Fall? 0 1  Fall Risk Category Calculator 0 3  Patient at Risk for Falls Due to No Fall Risks History of fall(s);Impaired balance/gait;Impaired mobility  Fall risk Follow up Falls evaluation completed Falls evaluation completed;Education provided     Vitals:   04/10/23 1525  BP: (!) 147/78  Pulse: 66  Resp: 18  Temp: (!) 97.1 F (36.2 C)  Weight: 143 lb 6.4 oz (65 kg)  Height: 5' (1.524 m)   Body mass index is 28.01 kg/m.  Physical Exam     Labs reviewed: Recent Labs    06/13/22 0000 10/27/22 0000 11/07/22 0000  NA 136* 139 139  K 4.1 3.9 4.2  CL 103 105 105  CO2 25* 27* 29*  BUN 20 13 17   CREATININE 1.0 1.0 1.0  CALCIUM 8.6* 8.7 8.7   No results for input(s): "AST", "ALT", "ALKPHOS", "BILITOT", "PROT", "ALBUMIN" in the last 8760 hours. Recent Labs    10/27/22 0000  11/04/22 0000 11/14/22 0000 11/17/22 0000 12/22/22 0000  WBC 6.3   < > 5.9 4.9 6.0  NEUTROABS 4,171.00  --   --  3,038.00 4,122.00  HGB 12.4   < > 9.6* 10.0* 11.5*  HCT 38   < > 30* 31* 37  PLT 147*   < > 173 166 170   < > = values in this interval not displayed.   Lab Results  Component Value Date   TSH 2.04 06/13/2022   Lab Results  Component Value Date   HGBA1C 5.8 (H) 04/04/2017   Lab Results  Component Value Date   CHOL 138 02/17/2022   HDL 41 02/17/2022   LDLCALC 74 02/17/2022   TRIG 156 02/17/2022   CHOLHDL 6.3 04/04/2017    Significant Diagnostic Results in last 30 days:  No  results found.  Assessment/Plan ***   Family/ staff Communication: Discussed plan of care with resident and charge nurse  Labs/tests ordered:     Kenard Gower, DNP, MSN, FNP-BC [facility] [phone] (Monday-Friday 8:00 a.m. - 5:00 p.m.) [phone] (after hours)

## 2023-04-12 DIAGNOSIS — N390 Urinary tract infection, site not specified: Secondary | ICD-10-CM | POA: Diagnosis not present

## 2023-04-20 DIAGNOSIS — F0150 Vascular dementia without behavioral disturbance: Secondary | ICD-10-CM | POA: Diagnosis not present

## 2023-04-20 DIAGNOSIS — Z9181 History of falling: Secondary | ICD-10-CM | POA: Diagnosis not present

## 2023-04-20 DIAGNOSIS — M6281 Muscle weakness (generalized): Secondary | ICD-10-CM | POA: Diagnosis not present

## 2023-04-20 DIAGNOSIS — Z741 Need for assistance with personal care: Secondary | ICD-10-CM | POA: Diagnosis not present

## 2023-04-21 DIAGNOSIS — M6281 Muscle weakness (generalized): Secondary | ICD-10-CM | POA: Diagnosis not present

## 2023-04-21 DIAGNOSIS — F0150 Vascular dementia without behavioral disturbance: Secondary | ICD-10-CM | POA: Diagnosis not present

## 2023-04-21 DIAGNOSIS — Z741 Need for assistance with personal care: Secondary | ICD-10-CM | POA: Diagnosis not present

## 2023-04-21 DIAGNOSIS — Z9181 History of falling: Secondary | ICD-10-CM | POA: Diagnosis not present

## 2023-04-24 DIAGNOSIS — Z9181 History of falling: Secondary | ICD-10-CM | POA: Diagnosis not present

## 2023-04-24 DIAGNOSIS — Z741 Need for assistance with personal care: Secondary | ICD-10-CM | POA: Diagnosis not present

## 2023-04-24 DIAGNOSIS — F0150 Vascular dementia without behavioral disturbance: Secondary | ICD-10-CM | POA: Diagnosis not present

## 2023-04-24 DIAGNOSIS — M6281 Muscle weakness (generalized): Secondary | ICD-10-CM | POA: Diagnosis not present

## 2023-04-25 ENCOUNTER — Non-Acute Institutional Stay (SKILLED_NURSING_FACILITY): Payer: Self-pay | Admitting: Nurse Practitioner

## 2023-04-25 ENCOUNTER — Encounter: Payer: Self-pay | Admitting: Nurse Practitioner

## 2023-04-25 DIAGNOSIS — F0150 Vascular dementia without behavioral disturbance: Secondary | ICD-10-CM | POA: Diagnosis not present

## 2023-04-25 DIAGNOSIS — Z9181 History of falling: Secondary | ICD-10-CM | POA: Diagnosis not present

## 2023-04-25 DIAGNOSIS — K219 Gastro-esophageal reflux disease without esophagitis: Secondary | ICD-10-CM | POA: Diagnosis not present

## 2023-04-25 DIAGNOSIS — M150 Primary generalized (osteo)arthritis: Secondary | ICD-10-CM

## 2023-04-25 DIAGNOSIS — I10 Essential (primary) hypertension: Secondary | ICD-10-CM | POA: Diagnosis not present

## 2023-04-25 DIAGNOSIS — Z741 Need for assistance with personal care: Secondary | ICD-10-CM | POA: Diagnosis not present

## 2023-04-25 DIAGNOSIS — M6281 Muscle weakness (generalized): Secondary | ICD-10-CM | POA: Diagnosis not present

## 2023-04-25 DIAGNOSIS — Z8673 Personal history of transient ischemic attack (TIA), and cerebral infarction without residual deficits: Secondary | ICD-10-CM | POA: Diagnosis not present

## 2023-04-25 DIAGNOSIS — F39 Unspecified mood [affective] disorder: Secondary | ICD-10-CM

## 2023-04-25 DIAGNOSIS — E559 Vitamin D deficiency, unspecified: Secondary | ICD-10-CM

## 2023-04-25 DIAGNOSIS — E039 Hypothyroidism, unspecified: Secondary | ICD-10-CM | POA: Diagnosis not present

## 2023-04-25 DIAGNOSIS — E782 Mixed hyperlipidemia: Secondary | ICD-10-CM

## 2023-04-25 NOTE — Progress Notes (Signed)
 Location:  Other Nursing Home Room Number: [facility] [address] Place of Service:  SNF [phone])  Kaitlyn Ramos, [name], MD  Patient Care Team: Kaitlyn Ramos Fine, MD as PCP - General [facility] Medicine)  Extended Emergency Contact Information Primary Emergency Contact: [name] Address: [address]          [place], [place] [address] [place] Home Phone: [phone] Relation: Daughter Secondary Emergency Contact: [name]  [place] Home Phone: [phone] Mobile Phone: [phone] Relation: Son  Goals of care: Advanced Directive information    04/25/2023   12:26 PM  Advanced Directives  Does Patient Have a Medical Advance Directive? Yes  Type of Advance Directive Out of facility DNR (pink MOST or yellow form)  Does patient want to make changes to medical advance directive? No - Patient declined     Chief Complaint  Patient presents with   Medical Management of Chronic Issues    Medical Management of Chronic Issues.     HPI:  Pt is a 89 y.o. female seen today for medical management of chronic disease.  Pt with hx of CVA, dementia, htn, GIB, hypothyroid She has progressive memory loss but response to questions today. She denies pain, anxiety or depression. Staff reports she is eating well. She denies trouble chewing or swallowing.  Nursing has no concerns  Past Medical History:  Diagnosis Date   Cervicalgia    Depression    Essential hypertension    Hemorrhoids    History of MRSA infection    Hyperlipidemia    Hypertension    Hypothyroidism    IBS (irritable bowel syndrome)    Osteoarthritis of multiple joints    Restless leg    Senile osteoporosis    Shingles    Sleep apnea    Ulcerative colitis (HCC)    Urge incontinence    Past Surgical History:  Procedure Laterality Date   ABDOMINAL HYSTERECTOMY  1980   cataract surgery     CHOLECYSTECTOMY  1955   colon polyp removal  12/2012   ESOPHAGOGASTRODUODENOSCOPY   2014   makoplasty Left 06/2012   parotid gland removal  1984   VISCERAL ANGIOGRAPHY N/A 09/13/2017   Procedure: VISCERAL ANGIOGRAPHY;  Surgeon: Jama Cordella MATSU, MD;  Location: [facility] INVASIVE CV LAB;  Service: Cardiovascular;  Laterality: N/A;    Allergies  Allergen Reactions   Codeine Sulfate Hives and Itching    Can take tramadol   Codeine Other (See Comments)    Outpatient Encounter Medications as of 04/25/2023  Medication Sig   acetaminophen  (TYLENOL ) 500 MG tablet Take 1,000 mg by mouth 3 (three) times daily.   alum & mag hydroxide-simeth (MAALOX/MYLANTA) 200-200-20 MG/5ML suspension Take 30 mLs by mouth every 4 (four) hours as needed for indigestion or heartburn.   amoxicillin -clavulanate (AUGMENTIN ) 875-125 MG tablet Take 1 tablet by mouth 2 (two) times daily.   atorvastatin  (LIPITOR) 40 MG tablet Take 1 tablet (40 mg total) by mouth daily at 6 PM.   Calcium  Carbonate-Vit D-Min (CALCIUM  600+D PLUS MINERALS) 600-400 MG-UNIT TABS Take 1 tablet by mouth daily.   Cholecalciferol (D3-50) 50000 units capsule Take 50,000 Units by mouth every 30 (thirty) days.   cyanocobalamin  1000 MCG tablet Take 1,000 mcg by mouth daily.   DULoxetine  (CYMBALTA ) 20 MG capsule Take 20 mg by mouth daily.   levothyroxine  (SYNTHROID , LEVOTHROID) 50 MCG tablet Take 50 mcg by mouth daily before breakfast.   losartan  (COZAAR ) 50 MG tablet Take 50 mg by mouth daily.  mirtazapine (REMERON) 7.5 MG tablet Take 7.5 mg by mouth at bedtime.   Multiple Vitamins-Minerals (MULTIVITAMIN ADULT PO) Take 1 tablet by mouth daily.    [DISCONTINUED] ferrous sulfate 300 (60 Fe) MG/5ML syrup Take 7.4 mLs by mouth daily. (Patient not taking: Reported on 04/25/2023)   [DISCONTINUED] lidocaine  (ASPERCREME LIDOCAINE ) 4 % Place 1 patch onto the skin daily. As needed for pain (Patient not taking: Reported on 04/25/2023)   [DISCONTINUED] magnesium hydroxide (MILK OF MAGNESIA) 400 MG/5ML suspension Take 30 mLs by mouth every 12 (twelve)  hours as needed for mild constipation. (Patient not taking: Reported on 04/25/2023)   [DISCONTINUED] pantoprazole  (PROTONIX ) 40 MG tablet Take 40 mg by mouth 2 (two) times daily. (Patient not taking: Reported on 04/25/2023)   [DISCONTINUED] polyethylene glycol (MIRALAX / GLYCOLAX) 17 g packet Take 17 g by mouth daily as needed. (Patient not taking: Reported on 04/25/2023)   [DISCONTINUED] Zinc Oxide (TRIPLE PASTE) 12.8 % ointment Apply 1 Application topically. Twice daily. (Patient not taking: Reported on 04/25/2023)   No facility-administered encounter medications on file as of 04/25/2023.    Review of Systems   Immunization History  Administered Date(s) Administered   Influenza, High Dose Seasonal PF 04/05/2017   Influenza-Unspecified 02/04/2020, 02/01/2021, 02/01/2022, 02/08/2023   MODERNA COVID-19 SARS-COV-2 PEDS BIVALENT BOOSTER 61yr-37yr 04/29/2019, 05/27/2019, 02/25/2022   Moderna Covid-19 Fall Seasonal Vaccine 61yrs & older 07/26/2022   Moderna SARS-COV2 Booster Vaccination 09/04/2020   Pfizer Covid-19 Vaccine Bivalent Booster 85yrs & up 02/28/2020, 01/08/2021   Pneumococcal Conjugate-13 03/07/2018   Pneumococcal Polysaccharide-23 03/11/2019   Tdap 09/14/2010   Pertinent  Health Maintenance Due  Topic Date Due   INFLUENZA VACCINE  Completed   DEXA SCAN  Discontinued      06/10/2022    2:55 PM 11/10/2022   11:48 AM  Fall Risk  Falls in the past year? 0 1  Was there an injury with Fall? 0 1  Fall Risk Category Calculator 0 3  Patient at Risk for Falls Due to No Fall Risks History of fall(s);Impaired balance/gait;Impaired mobility  Fall risk Follow up Falls evaluation completed Falls evaluation completed;Education provided   Functional Status Survey:    Vitals:   04/25/23 1220  BP: 133/75  Pulse: 68  Resp: 18  Temp: 97.8 F (36.6 C)  SpO2: 94%  Weight: 145 lb 6.4 oz (66 kg)  Height: 5' (1.524 m)   Body mass index is 28.4 kg/m. Physical Exam Constitutional:       General: She is not in acute distress.    Appearance: She is well-developed. She is not diaphoretic.  HENT:     Head: Normocephalic and atraumatic.     Mouth/Throat:     Pharynx: No oropharyngeal exudate.  Eyes:     Conjunctiva/sclera: Conjunctivae normal.     Pupils: Pupils are equal, round, and reactive to light.  Cardiovascular:     Rate and Rhythm: Normal rate and regular rhythm.     Heart sounds: Normal heart sounds.  Pulmonary:     Effort: Pulmonary effort is normal.     Breath sounds: Normal breath sounds.  Abdominal:     General: Bowel sounds are normal.     Palpations: Abdomen is soft.  Musculoskeletal:     Cervical back: Normal range of motion and neck supple.     Right lower leg: No edema.     Left lower leg: No edema.  Skin:    General: Skin is warm and dry.  Neurological:  Mental Status: She is alert.  Psychiatric:        Mood and Affect: Mood normal.     Labs reviewed: Recent Labs    11/07/22 0000 02/20/23 0000 04/10/23 0000  NA 139 138 137  K 4.2 4.4 4.0  CL 105 105 6*  CO2 29* 29* 27*  BUN 17 24* 19  CREATININE 1.0 1.0 1.0  CALCIUM  8.7 9.0 8.9   Recent Labs    02/20/23 0000  AST 14  ALT 11  ALKPHOS 45  ALBUMIN 3.6   Recent Labs    12/22/22 0000 01/19/23 0000 02/20/23 0000 04/10/23 0000  WBC 6.0 6.7 6.1 7.0  NEUTROABS 4,122.00 4,750.00 3,739.00  --   HGB 11.5* 11.4* 11.9* 12.9  HCT 37 36 38 41  PLT 170 141* 164 88*   Lab Results  Component Value Date   TSH 1.40 02/20/2023   Lab Results  Component Value Date   HGBA1C 5.8 (H) 04/04/2017   Lab Results  Component Value Date   CHOL 130 02/20/2023   HDL 43 02/20/2023   LDLCALC 63 02/20/2023   TRIG 165 (A) 02/20/2023   CHOLHDL 6.3 04/04/2017    Significant Diagnostic Results in last 30 days:  No results found.  Assessment/Plan 1. Primary hypertension (Primary) -Blood pressure well controlled, goal bp <140/90 Continue current medications and dietary  modifications follow metabolic panel  2. Mood disorder (HCC)   Controlled on cymbalta   3. Adult hypothyroidism -TSH at goal on synthroid    4. History of CVA (cerebrovascular accident) Continues on statin, not on asa due to hx of GI bleed   5. Gastroesophageal reflux disease without esophagitis -no reports of worsening of symptoms, off medication at this time  6. Vitamin D  deficiency Continues on monthly vit d 50000 units  7. Primary osteoarthritis involving multiple joints Stable on tylenol  scheduled three times daily  8. Combined hyperlipidemia LDL at goal on lipitor 40 mg daily   9. Weight loss She has had stable weight on remeron, will continue at this time.   Brevan Luberto K. Caro BODILY [facility] [phone]

## 2023-04-25 NOTE — Progress Notes (Deleted)
 Location:  Other [facility].  Nursing Home Room Number: [facility] [address] Place of Service:  SNF [phone]) Harlene An NP  PCP: [name] Fine, MD  Patient Care Team: [name] Fine, MD as PCP - General [facility] Medicine)  Extended Emergency Contact Information Primary Emergency Contact: [name] Address: [address]          [place], [place] [address] [place] Home Phone: [phone] Relation: Daughter Secondary Emergency Contact: [name]  [place] Home Phone: [phone] Mobile Phone: [phone] Relation: Son  Goals of care: Advanced Directive information    04/25/2023   12:26 PM  Advanced Directives  Does Patient Have a Medical Advance Directive? Yes  Type of Advance Directive Out of facility DNR (pink MOST or yellow form)  Does patient want to make changes to medical advance directive? No - Patient declined     Chief Complaint  Patient presents with   Medical Management of Chronic Issues    Medical Management of Chronic Issues.     HPI:  Pt is a 89 y.o. female seen today for medical management of chronic disease.    Past Medical History:  Diagnosis Date   Cervicalgia    Depression    Essential hypertension    Hemorrhoids    History of MRSA infection    Hyperlipidemia    Hypertension    Hypothyroidism    IBS (irritable bowel syndrome)    Osteoarthritis of multiple joints    Restless leg    Senile osteoporosis    Shingles    Sleep apnea    Ulcerative colitis (HCC)    Urge incontinence    Past Surgical History:  Procedure Laterality Date   ABDOMINAL HYSTERECTOMY  1980   cataract surgery     CHOLECYSTECTOMY  1955   colon polyp removal  12/2012   ESOPHAGOGASTRODUODENOSCOPY  2014   makoplasty Left 06/2012   parotid gland removal  1984   VISCERAL ANGIOGRAPHY N/A 09/13/2017   Procedure: VISCERAL ANGIOGRAPHY;  Surgeon: Jama Cordella MATSU, MD;  Location: [facility] INVASIVE CV LAB;  Service:  Cardiovascular;  Laterality: N/A;    Allergies  Allergen Reactions   Codeine Sulfate Hives and Itching    Can take tramadol   Codeine Other (See Comments)    Outpatient Encounter Medications as of 04/25/2023  Medication Sig   acetaminophen  (TYLENOL ) 500 MG tablet Take 1,000 mg by mouth 3 (three) times daily.   alum & mag hydroxide-simeth (MAALOX/MYLANTA) 200-200-20 MG/5ML suspension Take 30 mLs by mouth every 4 (four) hours as needed for indigestion or heartburn.   amoxicillin -clavulanate (AUGMENTIN ) 875-125 MG tablet Take 1 tablet by mouth 2 (two) times daily.   atorvastatin  (LIPITOR) 40 MG tablet Take 1 tablet (40 mg total) by mouth daily at 6 PM.   Calcium  Carbonate-Vit D-Min (CALCIUM  600+D PLUS MINERALS) 600-400 MG-UNIT TABS Take 1 tablet by mouth daily.   Cholecalciferol (D3-50) 50000 units capsule Take 50,000 Units by mouth every 30 (thirty) days.   cyanocobalamin  1000 MCG tablet Take 1,000 mcg by mouth daily.   DULoxetine  (CYMBALTA ) 20 MG capsule Take 20 mg by mouth daily.   levothyroxine  (SYNTHROID , LEVOTHROID) 50 MCG tablet Take 50 mcg by mouth daily before breakfast.   losartan  (COZAAR ) 50 MG tablet Take 50 mg by mouth daily.    mirtazapine (REMERON) 7.5 MG tablet Take 7.5 mg by mouth at bedtime.   Multiple Vitamins-Minerals (MULTIVITAMIN ADULT PO) Take 1 tablet by mouth daily.    ferrous sulfate 300 (60 Fe)  MG/5ML syrup Take 7.4 mLs by mouth daily. (Patient not taking: Reported on 04/25/2023)   lidocaine  (ASPERCREME LIDOCAINE ) 4 % Place 1 patch onto the skin daily. As needed for pain (Patient not taking: Reported on 04/25/2023)   magnesium hydroxide (MILK OF MAGNESIA) 400 MG/5ML suspension Take 30 mLs by mouth every 12 (twelve) hours as needed for mild constipation. (Patient not taking: Reported on 04/25/2023)   pantoprazole  (PROTONIX ) 40 MG tablet Take 40 mg by mouth 2 (two) times daily. (Patient not taking: Reported on 04/25/2023)   polyethylene glycol (MIRALAX / GLYCOLAX) 17 g packet  Take 17 g by mouth daily as needed. (Patient not taking: Reported on 04/25/2023)   Zinc Oxide (TRIPLE PASTE) 12.8 % ointment Apply 1 Application topically. Twice daily. (Patient not taking: Reported on 04/25/2023)   No facility-administered encounter medications on file as of 04/25/2023.    Review of Systems ***  Immunization History  Administered Date(s) Administered   Influenza, High Dose Seasonal PF 04/05/2017   Influenza-Unspecified 02/04/2020, 02/01/2021, 02/01/2022, 02/08/2023   MODERNA COVID-19 SARS-COV-2 PEDS BIVALENT BOOSTER 71yr-72yr 04/29/2019, 05/27/2019, 02/25/2022   Moderna Covid-19 Fall Seasonal Vaccine 25yrs & older 07/26/2022   Moderna SARS-COV2 Booster Vaccination 09/04/2020   Pfizer Covid-19 Vaccine Bivalent Booster 57yrs & up 02/28/2020, 01/08/2021   Pneumococcal Conjugate-13 03/07/2018   Pneumococcal Polysaccharide-23 03/11/2019   Tdap 09/14/2010   Pertinent  Health Maintenance Due  Topic Date Due   INFLUENZA VACCINE  Completed   DEXA SCAN  Discontinued      06/10/2022    2:55 PM 11/10/2022   11:48 AM  Fall Risk  Falls in the past year? 0 1  Was there an injury with Fall? 0 1  Fall Risk Category Calculator 0 3  Patient at Risk for Falls Due to No Fall Risks History of fall(s);Impaired balance/gait;Impaired mobility  Fall risk Follow up Falls evaluation completed Falls evaluation completed;Education provided   Functional Status Survey:    Vitals:   04/25/23 1220  BP: 133/75  Pulse: 68  Resp: 18  Temp: 97.8 F (36.6 C)  SpO2: 94%  Weight: 145 lb 6.4 oz (66 kg)  Height: 5' (1.524 m)   Body mass index is 28.4 kg/m. Physical Exam***  Labs reviewed: Recent Labs    11/07/22 0000 02/20/23 0000 04/10/23 0000  NA 139 138 137  K 4.2 4.4 4.0  CL 105 105 6*  CO2 29* 29* 27*  BUN 17 24* 19  CREATININE 1.0 1.0 1.0  CALCIUM  8.7 9.0 8.9   Recent Labs    02/20/23 0000  AST 14  ALT 11  ALKPHOS 45  ALBUMIN 3.6   Recent Labs    12/22/22 0000  01/19/23 0000 02/20/23 0000 04/10/23 0000  WBC 6.0 6.7 6.1 7.0  NEUTROABS 4,122.00 4,750.00 3,739.00  --   HGB 11.5* 11.4* 11.9* 12.9  HCT 37 36 38 41  PLT 170 141* 164 88*   Lab Results  Component Value Date   TSH 1.40 02/20/2023   Lab Results  Component Value Date   HGBA1C 5.8 (H) 04/04/2017   Lab Results  Component Value Date   CHOL 130 02/20/2023   HDL 43 02/20/2023   LDLCALC 63 02/20/2023   TRIG 165 (A) 02/20/2023   CHOLHDL 6.3 04/04/2017    Significant Diagnostic Results in last 30 days:  No results found.  Assessment/Plan There are no diagnoses linked to this encounter.   Trenee Igoe K. Caro BODILY [facility] [phone]

## 2023-04-26 DIAGNOSIS — Z9181 History of falling: Secondary | ICD-10-CM | POA: Diagnosis not present

## 2023-04-26 DIAGNOSIS — Z741 Need for assistance with personal care: Secondary | ICD-10-CM | POA: Diagnosis not present

## 2023-04-26 DIAGNOSIS — F0150 Vascular dementia without behavioral disturbance: Secondary | ICD-10-CM | POA: Diagnosis not present

## 2023-04-26 DIAGNOSIS — M6281 Muscle weakness (generalized): Secondary | ICD-10-CM | POA: Diagnosis not present

## 2023-04-28 DIAGNOSIS — Z741 Need for assistance with personal care: Secondary | ICD-10-CM | POA: Diagnosis not present

## 2023-04-28 DIAGNOSIS — Z9181 History of falling: Secondary | ICD-10-CM | POA: Diagnosis not present

## 2023-04-28 DIAGNOSIS — F0150 Vascular dementia without behavioral disturbance: Secondary | ICD-10-CM | POA: Diagnosis not present

## 2023-04-28 DIAGNOSIS — M6281 Muscle weakness (generalized): Secondary | ICD-10-CM | POA: Diagnosis not present

## 2023-05-01 DIAGNOSIS — Z9181 History of falling: Secondary | ICD-10-CM | POA: Diagnosis not present

## 2023-05-01 DIAGNOSIS — F0150 Vascular dementia without behavioral disturbance: Secondary | ICD-10-CM | POA: Diagnosis not present

## 2023-05-01 DIAGNOSIS — M6281 Muscle weakness (generalized): Secondary | ICD-10-CM | POA: Diagnosis not present

## 2023-05-01 DIAGNOSIS — Z741 Need for assistance with personal care: Secondary | ICD-10-CM | POA: Diagnosis not present

## 2023-05-02 DIAGNOSIS — M6281 Muscle weakness (generalized): Secondary | ICD-10-CM | POA: Diagnosis not present

## 2023-05-02 DIAGNOSIS — Z9181 History of falling: Secondary | ICD-10-CM | POA: Diagnosis not present

## 2023-05-02 DIAGNOSIS — Z741 Need for assistance with personal care: Secondary | ICD-10-CM | POA: Diagnosis not present

## 2023-05-02 DIAGNOSIS — F0150 Vascular dementia without behavioral disturbance: Secondary | ICD-10-CM | POA: Diagnosis not present

## 2023-05-03 DIAGNOSIS — Z9181 History of falling: Secondary | ICD-10-CM | POA: Diagnosis not present

## 2023-05-03 DIAGNOSIS — F0150 Vascular dementia without behavioral disturbance: Secondary | ICD-10-CM | POA: Diagnosis not present

## 2023-05-03 DIAGNOSIS — M6281 Muscle weakness (generalized): Secondary | ICD-10-CM | POA: Diagnosis not present

## 2023-05-03 DIAGNOSIS — Z741 Need for assistance with personal care: Secondary | ICD-10-CM | POA: Diagnosis not present

## 2023-05-05 DIAGNOSIS — M6281 Muscle weakness (generalized): Secondary | ICD-10-CM | POA: Diagnosis not present

## 2023-05-05 DIAGNOSIS — Z741 Need for assistance with personal care: Secondary | ICD-10-CM | POA: Diagnosis not present

## 2023-05-05 DIAGNOSIS — Z9181 History of falling: Secondary | ICD-10-CM | POA: Diagnosis not present

## 2023-05-05 DIAGNOSIS — F0150 Vascular dementia without behavioral disturbance: Secondary | ICD-10-CM | POA: Diagnosis not present

## 2023-05-08 DIAGNOSIS — Z9181 History of falling: Secondary | ICD-10-CM | POA: Diagnosis not present

## 2023-05-08 DIAGNOSIS — M6281 Muscle weakness (generalized): Secondary | ICD-10-CM | POA: Diagnosis not present

## 2023-05-08 DIAGNOSIS — F0150 Vascular dementia without behavioral disturbance: Secondary | ICD-10-CM | POA: Diagnosis not present

## 2023-05-08 DIAGNOSIS — Z741 Need for assistance with personal care: Secondary | ICD-10-CM | POA: Diagnosis not present

## 2023-05-09 DIAGNOSIS — F0150 Vascular dementia without behavioral disturbance: Secondary | ICD-10-CM | POA: Diagnosis not present

## 2023-05-09 DIAGNOSIS — Z741 Need for assistance with personal care: Secondary | ICD-10-CM | POA: Diagnosis not present

## 2023-05-09 DIAGNOSIS — M6281 Muscle weakness (generalized): Secondary | ICD-10-CM | POA: Diagnosis not present

## 2023-05-09 DIAGNOSIS — Z9181 History of falling: Secondary | ICD-10-CM | POA: Diagnosis not present

## 2023-05-11 DIAGNOSIS — D510 Vitamin B12 deficiency anemia due to intrinsic factor deficiency: Secondary | ICD-10-CM | POA: Diagnosis not present

## 2023-05-11 LAB — CBC AND DIFFERENTIAL
HCT: 38 (ref 36–46)
Hemoglobin: 12.1 (ref 12.0–16.0)
Neutrophils Absolute: 2846
Platelets: 106 10*3/uL — AB (ref 150–400)
WBC: 5.3

## 2023-05-11 LAB — CBC: RBC: 4.18 (ref 3.87–5.11)

## 2023-05-12 DIAGNOSIS — F0150 Vascular dementia without behavioral disturbance: Secondary | ICD-10-CM | POA: Diagnosis not present

## 2023-05-12 DIAGNOSIS — Z9181 History of falling: Secondary | ICD-10-CM | POA: Diagnosis not present

## 2023-05-12 DIAGNOSIS — Z741 Need for assistance with personal care: Secondary | ICD-10-CM | POA: Diagnosis not present

## 2023-05-12 DIAGNOSIS — M6281 Muscle weakness (generalized): Secondary | ICD-10-CM | POA: Diagnosis not present

## 2023-07-05 ENCOUNTER — Encounter: Payer: Self-pay | Admitting: Student

## 2023-07-05 ENCOUNTER — Non-Acute Institutional Stay (SKILLED_NURSING_FACILITY): Payer: Self-pay | Admitting: Student

## 2023-07-05 DIAGNOSIS — E039 Hypothyroidism, unspecified: Secondary | ICD-10-CM | POA: Diagnosis not present

## 2023-07-05 DIAGNOSIS — E782 Mixed hyperlipidemia: Secondary | ICD-10-CM

## 2023-07-05 DIAGNOSIS — I69311 Memory deficit following cerebral infarction: Secondary | ICD-10-CM

## 2023-07-05 DIAGNOSIS — K219 Gastro-esophageal reflux disease without esophagitis: Secondary | ICD-10-CM

## 2023-07-05 NOTE — Progress Notes (Unsigned)
 Location:  Other [facility].  Nursing Home Room Number: [facility] [address] Place of Service:  SNF [phone]) Provider:  Earnestine Mealing, MD  Patient Care Team: Earnestine Mealing, MD as PCP - General [facility] Medicine)  Extended Emergency Contact Information Primary Emergency Contact: Fayette Pho Address: [address]          [place], [place] [address] [place] Home Phone: [phone] Relation: Daughter Secondary Emergency Contact: Satira Sark [place] Home Phone: [phone] Mobile Phone: [phone] Relation: Son  Code Status:  DNR Goals of care: Advanced Directive information    07/05/2023   11:30 AM  Advanced Directives  Does Patient Have a Medical Advance Directive? Yes  Type of Advance Directive Out of facility DNR (pink MOST or yellow form)  Does patient want to make changes to medical advance directive? No - Patient declined     Chief Complaint  Patient presents with   Medical Management of Chronic Issues    Medical Management of Chronic Issues.     HPI:  Pt is a 89 y.o. female seen today for medical management of chronic diseases.   History of Present Illness The patient, with dementia, presents with word salad and disorientation.  She continues to exhibit symptoms of word salad, significantly impairing her ability to communicate. She is disoriented, as evidenced by her incorrect statement of being in '[place], [place]' when asked about her current location. This ongoing disorientation and confusion are consistent with her chronic condition.  She reports having a good breakfast and states she has been eating well. She mentions having three children. When asked about her sleep, she provides a disjointed response, indicating potential issues with sleep quality or understanding of the question.  No pain or swelling in her legs. She does not report any other specific symptoms during the conversation.  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 CARDIOVASCULAR: Harsh systolic murmur. ABDOMEN: Abdomen soft, non-distended. EXTREMITIES: No swelling in extremities.  Results RADIOLOGY Chest x-ray: clear lung fields  Assessment and Plan   Past Medical History:  Diagnosis Date   Cervicalgia    Depression    Essential hypertension    Hemorrhoids    History of MRSA infection    Hyperlipidemia    Hypertension    Hypothyroidism    IBS (irritable bowel syndrome)    Osteoarthritis of multiple joints    Restless leg    Senile osteoporosis    Shingles    Sleep apnea    Ulcerative colitis (HCC)    Urge incontinence    Past Surgical History:  Procedure Laterality Date   ABDOMINAL HYSTERECTOMY  1980   cataract surgery     CHOLECYSTECTOMY  1955   colon polyp removal  12/2012   ESOPHAGOGASTRODUODENOSCOPY  2014   makoplasty Left 06/2012   parotid gland removal  1984   VISCERAL ANGIOGRAPHY N/A 09/13/2017   Procedure: VISCERAL ANGIOGRAPHY;  Surgeon: Renford Dills, MD;  Location: [facility] INVASIVE CV LAB;  Service: Cardiovascular;  Laterality: N/A;    Allergies  Allergen Reactions   Codeine Sulfate Hives and Itching    Can take tramadol   Codeine Other (See Comments)    Outpatient Encounter Medications as of 07/05/2023  Medication Sig   acetaminophen (TYLENOL) 500 MG tablet Take 1,000 mg by mouth 2 (two) times daily.   alum & mag hydroxide-simeth (MAALOX/MYLANTA) 200-200-20 MG/5ML  suspension Take 30 mLs by mouth every 4 (four) hours as needed for indigestion or heartburn.   atorvastatin (LIPITOR) 40 MG tablet Take 1 tablet (40 mg total) by mouth daily at 6 PM.   Calcium Carbonate-Vit D-Min (CALCIUM 600+D PLUS MINERALS) 600-400 MG-UNIT TABS Take 1 tablet by mouth daily.   Cholecalciferol (D3-50) 50000 units capsule Take 50,000 Units by mouth every 30 (thirty) days.    cyanocobalamin 1000 MCG tablet Take 1,000 mcg by mouth daily.   DULoxetine (CYMBALTA) 20 MG capsule Take 20 mg by mouth daily.   levothyroxine (SYNTHROID, LEVOTHROID) 50 MCG tablet Take 50 mcg by mouth daily before breakfast.   losartan (COZAAR) 50 MG tablet Take 50 mg by mouth daily.    mirtazapine (REMERON) 7.5 MG tablet Take 7.5 mg by mouth at bedtime.   Multiple Vitamins-Minerals (MULTIVITAMIN ADULT PO) Take 1 tablet by mouth daily.    No facility-administered encounter medications on file as of 07/05/2023.    Review of Systems  Immunization History  Administered Date(s) Administered   Influenza, High Dose Seasonal PF 04/05/2017   Influenza-Unspecified 02/04/2020, 02/01/2021, 02/01/2022, 02/08/2023   MODERNA COVID-19 SARS-COV-2 PEDS BIVALENT BOOSTER 60yr-5yr 04/29/2019, 05/27/2019, 02/25/2022   Moderna Covid-19 Fall Seasonal Vaccine 28yrs & older 07/26/2022   Moderna SARS-COV2 Booster Vaccination 09/04/2020   Pfizer Covid-19 Vaccine Bivalent Booster 71yrs & up 02/28/2020, 01/08/2021   Pneumococcal Conjugate-13 03/07/2018   Pneumococcal Polysaccharide-23 03/11/2019   Tdap 09/14/2010   Pertinent  Health Maintenance Due  Topic Date Due   INFLUENZA VACCINE  Completed   DEXA SCAN  Discontinued      06/10/2022    2:55 PM 11/10/2022   11:48 AM  Fall Risk  Falls in the past year? 0 1  Was there an injury with Fall? 0 1  Fall Risk Category Calculator 0 3  Patient at Risk for Falls Due to No Fall Risks History of fall(s);Impaired balance/gait;Impaired mobility  Fall risk Follow up Falls evaluation completed Falls evaluation completed;Education provided   Functional Status Survey:    Vitals:   07/05/23 1123  BP: 137/89  Pulse: 61  Resp: 18  Temp: 98.1 F (36.7 C)  SpO2: 94%  Weight: 148 lb 12.8 oz (67.5 kg)  Height: 5' (1.524 m)   Body mass index is 29.06 kg/m. Physical Exam  Labs reviewed: Recent Labs    11/07/22 0000 02/20/23 0000 04/10/23 0000  NA 139 138 137   K 4.2 4.4 4.0  CL 105 105 6*  CO2 29* 29* 27*  BUN 17 24* 19  CREATININE 1.0 1.0 1.0  CALCIUM 8.7 9.0 8.9   Recent Labs    02/20/23 0000  AST 14  ALT 11  ALKPHOS 45  ALBUMIN 3.6   Recent Labs    01/19/23 0000 02/20/23 0000 04/10/23 0000 05/11/23 0000  WBC 6.7 6.1 7.0 5.3  NEUTROABS 4,750.00 3,739.00  --  2,846.00  HGB 11.4* 11.9* 12.9 12.1  HCT 36 38 41 38  PLT 141* 164 88* 106*   Lab Results  Component Value Date   TSH 1.40 02/20/2023   Lab Results  Component Value Date   HGBA1C 5.8 (H) 04/04/2017   Lab Results  Component Value Date   CHOL 130 02/20/2023   HDL 43 02/20/2023   LDLCALC 63 02/20/2023   TRIG 165 (A) 02/20/2023   CHOLHDL 6.3 04/04/2017    Significant Diagnostic Results in last 30 days:  No results found.  Assessment/Plan Dementia Dementia persists with symptoms including word salad and  disorientation, evidenced by inability to identify current location. The condition is chronic with no acute changes during this visit. Weight is stable and has had some continued weight gain over the last few months. Consider discontinuing mirtazapine given weight gain.   Hyperlipidemia  Continue atorvastatin  Hypertension Well-controlled on current medications. Continue losartan  Hypothyroidism Continue levothyroxine. Annual TSH   Family/ staff Communication: nursing  Labs/tests ordered:  none

## 2023-07-06 ENCOUNTER — Encounter: Payer: Self-pay | Admitting: Student

## 2023-07-21 DIAGNOSIS — M6281 Muscle weakness (generalized): Secondary | ICD-10-CM | POA: Diagnosis not present

## 2023-07-21 DIAGNOSIS — I69351 Hemiplegia and hemiparesis following cerebral infarction affecting right dominant side: Secondary | ICD-10-CM | POA: Diagnosis not present

## 2023-07-21 DIAGNOSIS — F0150 Vascular dementia without behavioral disturbance: Secondary | ICD-10-CM | POA: Diagnosis not present

## 2023-07-21 DIAGNOSIS — I129 Hypertensive chronic kidney disease with stage 1 through stage 4 chronic kidney disease, or unspecified chronic kidney disease: Secondary | ICD-10-CM | POA: Diagnosis not present

## 2023-07-21 DIAGNOSIS — Z9181 History of falling: Secondary | ICD-10-CM | POA: Diagnosis not present

## 2023-07-24 DIAGNOSIS — F0150 Vascular dementia without behavioral disturbance: Secondary | ICD-10-CM | POA: Diagnosis not present

## 2023-07-24 DIAGNOSIS — I69351 Hemiplegia and hemiparesis following cerebral infarction affecting right dominant side: Secondary | ICD-10-CM | POA: Diagnosis not present

## 2023-07-24 DIAGNOSIS — I129 Hypertensive chronic kidney disease with stage 1 through stage 4 chronic kidney disease, or unspecified chronic kidney disease: Secondary | ICD-10-CM | POA: Diagnosis not present

## 2023-07-24 DIAGNOSIS — Z9181 History of falling: Secondary | ICD-10-CM | POA: Diagnosis not present

## 2023-07-24 DIAGNOSIS — M6281 Muscle weakness (generalized): Secondary | ICD-10-CM | POA: Diagnosis not present

## 2023-07-25 DIAGNOSIS — I69351 Hemiplegia and hemiparesis following cerebral infarction affecting right dominant side: Secondary | ICD-10-CM | POA: Diagnosis not present

## 2023-07-25 DIAGNOSIS — I129 Hypertensive chronic kidney disease with stage 1 through stage 4 chronic kidney disease, or unspecified chronic kidney disease: Secondary | ICD-10-CM | POA: Diagnosis not present

## 2023-07-25 DIAGNOSIS — F0150 Vascular dementia without behavioral disturbance: Secondary | ICD-10-CM | POA: Diagnosis not present

## 2023-07-25 DIAGNOSIS — Z9181 History of falling: Secondary | ICD-10-CM | POA: Diagnosis not present

## 2023-07-25 DIAGNOSIS — M6281 Muscle weakness (generalized): Secondary | ICD-10-CM | POA: Diagnosis not present

## 2023-07-26 DIAGNOSIS — I69351 Hemiplegia and hemiparesis following cerebral infarction affecting right dominant side: Secondary | ICD-10-CM | POA: Diagnosis not present

## 2023-07-26 DIAGNOSIS — I129 Hypertensive chronic kidney disease with stage 1 through stage 4 chronic kidney disease, or unspecified chronic kidney disease: Secondary | ICD-10-CM | POA: Diagnosis not present

## 2023-07-26 DIAGNOSIS — F0150 Vascular dementia without behavioral disturbance: Secondary | ICD-10-CM | POA: Diagnosis not present

## 2023-07-26 DIAGNOSIS — M6281 Muscle weakness (generalized): Secondary | ICD-10-CM | POA: Diagnosis not present

## 2023-07-26 DIAGNOSIS — Z9181 History of falling: Secondary | ICD-10-CM | POA: Diagnosis not present

## 2023-07-27 DIAGNOSIS — I129 Hypertensive chronic kidney disease with stage 1 through stage 4 chronic kidney disease, or unspecified chronic kidney disease: Secondary | ICD-10-CM | POA: Diagnosis not present

## 2023-07-27 DIAGNOSIS — I69351 Hemiplegia and hemiparesis following cerebral infarction affecting right dominant side: Secondary | ICD-10-CM | POA: Diagnosis not present

## 2023-07-27 DIAGNOSIS — Z9181 History of falling: Secondary | ICD-10-CM | POA: Diagnosis not present

## 2023-07-27 DIAGNOSIS — F0150 Vascular dementia without behavioral disturbance: Secondary | ICD-10-CM | POA: Diagnosis not present

## 2023-07-27 DIAGNOSIS — M6281 Muscle weakness (generalized): Secondary | ICD-10-CM | POA: Diagnosis not present

## 2023-07-28 DIAGNOSIS — Z23 Encounter for immunization: Secondary | ICD-10-CM | POA: Diagnosis not present

## 2023-07-31 DIAGNOSIS — I69351 Hemiplegia and hemiparesis following cerebral infarction affecting right dominant side: Secondary | ICD-10-CM | POA: Diagnosis not present

## 2023-07-31 DIAGNOSIS — M6281 Muscle weakness (generalized): Secondary | ICD-10-CM | POA: Diagnosis not present

## 2023-07-31 DIAGNOSIS — I129 Hypertensive chronic kidney disease with stage 1 through stage 4 chronic kidney disease, or unspecified chronic kidney disease: Secondary | ICD-10-CM | POA: Diagnosis not present

## 2023-07-31 DIAGNOSIS — F0150 Vascular dementia without behavioral disturbance: Secondary | ICD-10-CM | POA: Diagnosis not present

## 2023-07-31 DIAGNOSIS — Z9181 History of falling: Secondary | ICD-10-CM | POA: Diagnosis not present

## 2023-08-01 DIAGNOSIS — M6281 Muscle weakness (generalized): Secondary | ICD-10-CM | POA: Diagnosis not present

## 2023-08-01 DIAGNOSIS — Z9181 History of falling: Secondary | ICD-10-CM | POA: Diagnosis not present

## 2023-08-01 DIAGNOSIS — F0150 Vascular dementia without behavioral disturbance: Secondary | ICD-10-CM | POA: Diagnosis not present

## 2023-08-01 DIAGNOSIS — I129 Hypertensive chronic kidney disease with stage 1 through stage 4 chronic kidney disease, or unspecified chronic kidney disease: Secondary | ICD-10-CM | POA: Diagnosis not present

## 2023-08-01 DIAGNOSIS — I69351 Hemiplegia and hemiparesis following cerebral infarction affecting right dominant side: Secondary | ICD-10-CM | POA: Diagnosis not present

## 2023-08-02 DIAGNOSIS — M6281 Muscle weakness (generalized): Secondary | ICD-10-CM | POA: Diagnosis not present

## 2023-08-02 DIAGNOSIS — F0150 Vascular dementia without behavioral disturbance: Secondary | ICD-10-CM | POA: Diagnosis not present

## 2023-08-02 DIAGNOSIS — I69351 Hemiplegia and hemiparesis following cerebral infarction affecting right dominant side: Secondary | ICD-10-CM | POA: Diagnosis not present

## 2023-08-02 DIAGNOSIS — I129 Hypertensive chronic kidney disease with stage 1 through stage 4 chronic kidney disease, or unspecified chronic kidney disease: Secondary | ICD-10-CM | POA: Diagnosis not present

## 2023-08-02 DIAGNOSIS — Z9181 History of falling: Secondary | ICD-10-CM | POA: Diagnosis not present

## 2023-08-03 DIAGNOSIS — M6281 Muscle weakness (generalized): Secondary | ICD-10-CM | POA: Diagnosis not present

## 2023-08-03 DIAGNOSIS — F0150 Vascular dementia without behavioral disturbance: Secondary | ICD-10-CM | POA: Diagnosis not present

## 2023-08-03 DIAGNOSIS — I69351 Hemiplegia and hemiparesis following cerebral infarction affecting right dominant side: Secondary | ICD-10-CM | POA: Diagnosis not present

## 2023-08-03 DIAGNOSIS — I129 Hypertensive chronic kidney disease with stage 1 through stage 4 chronic kidney disease, or unspecified chronic kidney disease: Secondary | ICD-10-CM | POA: Diagnosis not present

## 2023-08-03 DIAGNOSIS — Z9181 History of falling: Secondary | ICD-10-CM | POA: Diagnosis not present

## 2023-08-04 DIAGNOSIS — I69351 Hemiplegia and hemiparesis following cerebral infarction affecting right dominant side: Secondary | ICD-10-CM | POA: Diagnosis not present

## 2023-08-04 DIAGNOSIS — F0150 Vascular dementia without behavioral disturbance: Secondary | ICD-10-CM | POA: Diagnosis not present

## 2023-08-04 DIAGNOSIS — M6281 Muscle weakness (generalized): Secondary | ICD-10-CM | POA: Diagnosis not present

## 2023-08-04 DIAGNOSIS — I129 Hypertensive chronic kidney disease with stage 1 through stage 4 chronic kidney disease, or unspecified chronic kidney disease: Secondary | ICD-10-CM | POA: Diagnosis not present

## 2023-08-04 DIAGNOSIS — Z9181 History of falling: Secondary | ICD-10-CM | POA: Diagnosis not present

## 2023-08-08 DIAGNOSIS — I69351 Hemiplegia and hemiparesis following cerebral infarction affecting right dominant side: Secondary | ICD-10-CM | POA: Diagnosis not present

## 2023-08-08 DIAGNOSIS — M6281 Muscle weakness (generalized): Secondary | ICD-10-CM | POA: Diagnosis not present

## 2023-08-08 DIAGNOSIS — Z9181 History of falling: Secondary | ICD-10-CM | POA: Diagnosis not present

## 2023-08-08 DIAGNOSIS — F0150 Vascular dementia without behavioral disturbance: Secondary | ICD-10-CM | POA: Diagnosis not present

## 2023-08-08 DIAGNOSIS — I129 Hypertensive chronic kidney disease with stage 1 through stage 4 chronic kidney disease, or unspecified chronic kidney disease: Secondary | ICD-10-CM | POA: Diagnosis not present

## 2023-08-09 DIAGNOSIS — Z9181 History of falling: Secondary | ICD-10-CM | POA: Diagnosis not present

## 2023-08-09 DIAGNOSIS — F0150 Vascular dementia without behavioral disturbance: Secondary | ICD-10-CM | POA: Diagnosis not present

## 2023-08-09 DIAGNOSIS — M6281 Muscle weakness (generalized): Secondary | ICD-10-CM | POA: Diagnosis not present

## 2023-08-09 DIAGNOSIS — I129 Hypertensive chronic kidney disease with stage 1 through stage 4 chronic kidney disease, or unspecified chronic kidney disease: Secondary | ICD-10-CM | POA: Diagnosis not present

## 2023-08-09 DIAGNOSIS — I69351 Hemiplegia and hemiparesis following cerebral infarction affecting right dominant side: Secondary | ICD-10-CM | POA: Diagnosis not present

## 2023-08-10 DIAGNOSIS — I69351 Hemiplegia and hemiparesis following cerebral infarction affecting right dominant side: Secondary | ICD-10-CM | POA: Diagnosis not present

## 2023-08-10 DIAGNOSIS — F0150 Vascular dementia without behavioral disturbance: Secondary | ICD-10-CM | POA: Diagnosis not present

## 2023-08-10 DIAGNOSIS — M6281 Muscle weakness (generalized): Secondary | ICD-10-CM | POA: Diagnosis not present

## 2023-08-10 DIAGNOSIS — Z9181 History of falling: Secondary | ICD-10-CM | POA: Diagnosis not present

## 2023-08-10 DIAGNOSIS — I129 Hypertensive chronic kidney disease with stage 1 through stage 4 chronic kidney disease, or unspecified chronic kidney disease: Secondary | ICD-10-CM | POA: Diagnosis not present

## 2023-08-15 DIAGNOSIS — Z9181 History of falling: Secondary | ICD-10-CM | POA: Diagnosis not present

## 2023-08-15 DIAGNOSIS — F0150 Vascular dementia without behavioral disturbance: Secondary | ICD-10-CM | POA: Diagnosis not present

## 2023-08-15 DIAGNOSIS — I69351 Hemiplegia and hemiparesis following cerebral infarction affecting right dominant side: Secondary | ICD-10-CM | POA: Diagnosis not present

## 2023-08-15 DIAGNOSIS — M6281 Muscle weakness (generalized): Secondary | ICD-10-CM | POA: Diagnosis not present

## 2023-08-15 DIAGNOSIS — I129 Hypertensive chronic kidney disease with stage 1 through stage 4 chronic kidney disease, or unspecified chronic kidney disease: Secondary | ICD-10-CM | POA: Diagnosis not present

## 2023-08-17 ENCOUNTER — Non-Acute Institutional Stay (SKILLED_NURSING_FACILITY): Payer: Self-pay | Admitting: Nurse Practitioner

## 2023-08-17 ENCOUNTER — Encounter: Payer: Self-pay | Admitting: Nurse Practitioner

## 2023-08-17 DIAGNOSIS — F01B0 Vascular dementia, moderate, without behavioral disturbance, psychotic disturbance, mood disturbance, and anxiety: Secondary | ICD-10-CM

## 2023-08-17 DIAGNOSIS — I69351 Hemiplegia and hemiparesis following cerebral infarction affecting right dominant side: Secondary | ICD-10-CM | POA: Diagnosis not present

## 2023-08-17 DIAGNOSIS — I6930 Unspecified sequelae of cerebral infarction: Secondary | ICD-10-CM

## 2023-08-17 DIAGNOSIS — M6281 Muscle weakness (generalized): Secondary | ICD-10-CM | POA: Diagnosis not present

## 2023-08-17 DIAGNOSIS — I10 Essential (primary) hypertension: Secondary | ICD-10-CM | POA: Diagnosis not present

## 2023-08-17 DIAGNOSIS — F39 Unspecified mood [affective] disorder: Secondary | ICD-10-CM

## 2023-08-17 DIAGNOSIS — K219 Gastro-esophageal reflux disease without esophagitis: Secondary | ICD-10-CM | POA: Diagnosis not present

## 2023-08-17 DIAGNOSIS — F0150 Vascular dementia without behavioral disturbance: Secondary | ICD-10-CM | POA: Diagnosis not present

## 2023-08-17 DIAGNOSIS — E039 Hypothyroidism, unspecified: Secondary | ICD-10-CM | POA: Diagnosis not present

## 2023-08-17 DIAGNOSIS — M150 Primary generalized (osteo)arthritis: Secondary | ICD-10-CM

## 2023-08-17 DIAGNOSIS — I129 Hypertensive chronic kidney disease with stage 1 through stage 4 chronic kidney disease, or unspecified chronic kidney disease: Secondary | ICD-10-CM | POA: Diagnosis not present

## 2023-08-17 DIAGNOSIS — E782 Mixed hyperlipidemia: Secondary | ICD-10-CM | POA: Diagnosis not present

## 2023-08-17 DIAGNOSIS — Z9181 History of falling: Secondary | ICD-10-CM | POA: Diagnosis not present

## 2023-08-17 NOTE — Progress Notes (Unsigned)
 Location:  Other [place].  Nursing Home Room Number: [facility] [address] Place of Service:  SNF [phone]) [facility], NP  PCP: Valrie Gehrig, MD  Patient Care Team: Valrie Gehrig, MD as PCP - General [facility])  Extended Emergency Contact Information Primary Emergency Contact: Alex Andrew Address: [address]          [place], [place] [address] [place] Home Phone: [phone] Relation: Daughter Secondary Emergency Contact: Sameul Crew  [place] Home Phone: [phone] Mobile Phone: [phone] Relation: Son  Goals of care: Advanced Directive information    07/05/2023   11:30 AM  Advanced Directives  Does Patient Have a Medical Advance Directive? Yes  Type of Advance Directive Out of facility DNR (pink MOST or yellow form)  Does patient want to make changes to medical advance directive? No - Patient declined     Chief Complaint  Patient presents with   Medical Management of Chronic Issues    Medical Management of Chronic Issues.     HPI:  Pt is a 89 y.o. female seen today for medical management of chronic disease. Pt with progressive dementia with worsening mobility. She was using walker but now only uses for transfers and mostly uses WC.  Weight has been stable  No reports of pain.  No skin issues.  Pt has no complaints and staff without concerns at this time.    Past Medical History:  Diagnosis Date   Cervicalgia    Depression    Essential hypertension    Hemorrhoids    History of MRSA infection    Hyperlipidemia    Hypertension    Hypothyroidism    IBS (irritable bowel syndrome)    Osteoarthritis of multiple joints    Restless leg    Senile osteoporosis    Shingles    Sleep apnea    Ulcerative colitis (HCC)    Urge incontinence    Past Surgical History:  Procedure Laterality Date   ABDOMINAL HYSTERECTOMY  1980   cataract surgery     CHOLECYSTECTOMY  1955   colon polyp removal   12/2012   ESOPHAGOGASTRODUODENOSCOPY  2014   makoplasty Left 06/2012   parotid gland removal  1984   VISCERAL ANGIOGRAPHY N/A 09/13/2017   Procedure: VISCERAL ANGIOGRAPHY;  Surgeon: Jackquelyn Mass, MD;  Location: [facility] INVASIVE CV LAB;  Service: Cardiovascular;  Laterality: N/A;    Allergies  Allergen Reactions   Codeine Sulfate Hives and Itching    Can take tramadol   Codeine Other (See Comments)    Outpatient Encounter Medications as of 08/17/2023  Medication Sig   acetaminophen  (TYLENOL ) 500 MG tablet Take 1,000 mg by mouth 2 (two) times daily.   alum & mag hydroxide-simeth (MAALOX/MYLANTA) 200-200-20 MG/5ML suspension Take 30 mLs by mouth every 4 (four) hours as needed for indigestion or heartburn.   atorvastatin  (LIPITOR) 40 MG tablet Take 1 tablet (40 mg total) by mouth daily at 6 PM.   Calcium  Carbonate-Vit D-Min (CALCIUM  600+D PLUS MINERALS) 600-400 MG-UNIT TABS Take 1 tablet by mouth daily.   Cholecalciferol (D3-50) 50000 units capsule Take 50,000 Units by mouth every 30 (thirty) days.   cyanocobalamin  1000 MCG tablet Take 1,000 mcg by mouth daily.   DULoxetine  (CYMBALTA ) 20 MG capsule Take 20 mg by mouth daily.   levothyroxine  (SYNTHROID , LEVOTHROID) 50 MCG tablet Take 50 mcg by mouth daily before breakfast.   losartan  (COZAAR ) 50 MG tablet Take 50 mg by mouth daily.    mirtazapine (REMERON)  7.5 MG tablet Take 7.5 mg by mouth at bedtime.   Multiple Vitamins-Minerals (MULTIVITAMIN ADULT PO) Take 1 tablet by mouth daily.    Zinc Oxide (TRIPLE PASTE) 12.8 % ointment Apply 1 Application topically. Every day and evening shift.   No facility-administered encounter medications on file as of 08/17/2023.    Review of Systems  Unable to perform ROS: Dementia     Immunization History  Administered Date(s) Administered   Influenza, High Dose Seasonal PF 04/05/2017   Influenza-Unspecified 02/04/2020, 02/01/2021, 02/01/2022, 02/08/2023   MODERNA COVID-19 SARS-COV-2 PEDS BIVALENT  BOOSTER 83yr-55yr 04/29/2019, 05/27/2019, 02/25/2022   Moderna Covid-19 Fall Seasonal Vaccine 55yrs & older 07/26/2022   Moderna SARS-COV2 Booster Vaccination 09/04/2020   Pfizer Covid-19 Vaccine Bivalent Booster 31yrs & up 02/28/2020, 01/08/2021   Pneumococcal Conjugate-13 03/07/2018   Pneumococcal Polysaccharide-23 03/11/2019   Tdap 09/14/2010   Unspecified SARS-COV-2 Vaccination 07/28/2023   Pertinent  Health Maintenance Due  Topic Date Due   INFLUENZA VACCINE  11/17/2023   DEXA SCAN  Discontinued      06/10/2022    2:55 PM 11/10/2022   11:48 AM  Fall Risk  Falls in the past year? 0 1  Was there an injury with Fall? 0 1  Fall Risk Category Calculator 0 3  Patient at Risk for Falls Due to No Fall Risks History of fall(s);Impaired balance/gait;Impaired mobility  Fall risk Follow up Falls evaluation completed Falls evaluation completed;Education provided   Functional Status Survey:    Vitals:   08/17/23 1246 08/17/23 1254  BP: (!) 145/68 123/77  Pulse: 78   Resp: 18   Temp: 97.6 F (36.4 C)   SpO2: 94%   Weight: 147 lb 12.8 oz (67 kg)   Height: 5' (1.524 m)    Wt Readings from Last 3 Encounters:  08/17/23 147 lb 12.8 oz (67 kg)  07/05/23 148 lb 12.8 oz (67.5 kg)  04/25/23 145 lb 6.4 oz (66 kg)    Body mass index is 28.87 kg/m. Physical Exam Constitutional:      General: She is not in acute distress.    Appearance: She is well-developed. She is not diaphoretic.  HENT:     Head: Normocephalic and atraumatic.     Mouth/Throat:     Pharynx: No oropharyngeal exudate.  Eyes:     Conjunctiva/sclera: Conjunctivae normal.     Pupils: Pupils are equal, round, and reactive to light.  Cardiovascular:     Rate and Rhythm: Normal rate and regular rhythm.     Heart sounds: Normal heart sounds.  Pulmonary:     Effort: Pulmonary effort is normal.     Breath sounds: Normal breath sounds.  Abdominal:     General: Bowel sounds are normal.     Palpations: Abdomen is soft.   Musculoskeletal:     Cervical back: Normal range of motion and neck supple.     Right lower leg: No edema.     Left lower leg: No edema.  Skin:    General: Skin is warm and dry.  Neurological:     Mental Status: She is alert.     Motor: Weakness present.     Gait: Gait abnormal.  Psychiatric:        Mood and Affect: Mood normal.        Cognition and Memory: Cognition is impaired. Memory is impaired. She exhibits impaired recent memory and impaired remote memory.     Labs reviewed: Recent Labs    11/07/22 0000 02/20/23 0000 04/10/23  0000  NA 139 138 137  K 4.2 4.4 4.0  CL 105 105 6*  CO2 29* 29* 27*  BUN 17 24* 19  CREATININE 1.0 1.0 1.0  CALCIUM  8.7 9.0 8.9   Recent Labs    02/20/23 0000  AST 14  ALT 11  ALKPHOS 45  ALBUMIN 3.6   Recent Labs    01/19/23 0000 02/20/23 0000 04/10/23 0000 05/11/23 0000  WBC 6.7 6.1 7.0 5.3  NEUTROABS 4,750.00 3,739.00  --  2,846.00  HGB 11.4* 11.9* 12.9 12.1  HCT 36 38 41 38  PLT 141* 164 88* 106*   Lab Results  Component Value Date   TSH 1.40 02/20/2023   Lab Results  Component Value Date   HGBA1C 5.8 (H) 04/04/2017   Lab Results  Component Value Date   CHOL 130 02/20/2023   HDL 43 02/20/2023   LDLCALC 63 02/20/2023   TRIG 165 (A) 02/20/2023   CHOLHDL 6.3 04/04/2017    Significant Diagnostic Results in last 30 days:  No results found.  Assessment/Plan Primary hypertension Blood pressure well controlled, goal bp <140/90 Continue current medications and dietary modifications follow metabolic panel  Osteoarthritis of multiple joints Stable on schedule tylenol   Mood disorder (HCC) Controlled on remeron and cymbalta    Moderate vascular dementia without behavioral disturbance, psychotic disturbance, mood disturbance, or anxiety (HCC) Stable, no acute changes in cognitive or functional status, continue supportive care.    History of stroke with residual deficit Stable, continues on lipitor. Continues  with support from staff and family.   Gastroesophageal reflux disease No current symptoms at this time.   Combined hyperlipidemia Continues on lipitor 40 mg daily  Adult hypothyroidism Continues on synthroid , tsh at goal on last labs     Arp K. Denney Fisherman [facility] [phone]

## 2023-08-18 DIAGNOSIS — M6281 Muscle weakness (generalized): Secondary | ICD-10-CM | POA: Diagnosis not present

## 2023-08-18 DIAGNOSIS — I69351 Hemiplegia and hemiparesis following cerebral infarction affecting right dominant side: Secondary | ICD-10-CM | POA: Diagnosis not present

## 2023-08-18 DIAGNOSIS — I129 Hypertensive chronic kidney disease with stage 1 through stage 4 chronic kidney disease, or unspecified chronic kidney disease: Secondary | ICD-10-CM | POA: Diagnosis not present

## 2023-08-18 DIAGNOSIS — F0150 Vascular dementia without behavioral disturbance: Secondary | ICD-10-CM | POA: Diagnosis not present

## 2023-08-18 DIAGNOSIS — Z9181 History of falling: Secondary | ICD-10-CM | POA: Diagnosis not present

## 2023-08-19 DIAGNOSIS — I129 Hypertensive chronic kidney disease with stage 1 through stage 4 chronic kidney disease, or unspecified chronic kidney disease: Secondary | ICD-10-CM | POA: Diagnosis not present

## 2023-08-19 DIAGNOSIS — I69351 Hemiplegia and hemiparesis following cerebral infarction affecting right dominant side: Secondary | ICD-10-CM | POA: Diagnosis not present

## 2023-08-19 DIAGNOSIS — M6281 Muscle weakness (generalized): Secondary | ICD-10-CM | POA: Diagnosis not present

## 2023-08-19 DIAGNOSIS — Z9181 History of falling: Secondary | ICD-10-CM | POA: Diagnosis not present

## 2023-08-19 DIAGNOSIS — F0150 Vascular dementia without behavioral disturbance: Secondary | ICD-10-CM | POA: Diagnosis not present

## 2023-08-21 DIAGNOSIS — Z9181 History of falling: Secondary | ICD-10-CM | POA: Diagnosis not present

## 2023-08-21 DIAGNOSIS — I129 Hypertensive chronic kidney disease with stage 1 through stage 4 chronic kidney disease, or unspecified chronic kidney disease: Secondary | ICD-10-CM | POA: Diagnosis not present

## 2023-08-21 DIAGNOSIS — M6281 Muscle weakness (generalized): Secondary | ICD-10-CM | POA: Diagnosis not present

## 2023-08-21 DIAGNOSIS — I69351 Hemiplegia and hemiparesis following cerebral infarction affecting right dominant side: Secondary | ICD-10-CM | POA: Diagnosis not present

## 2023-08-21 DIAGNOSIS — F0150 Vascular dementia without behavioral disturbance: Secondary | ICD-10-CM | POA: Diagnosis not present

## 2023-08-22 DIAGNOSIS — I129 Hypertensive chronic kidney disease with stage 1 through stage 4 chronic kidney disease, or unspecified chronic kidney disease: Secondary | ICD-10-CM | POA: Diagnosis not present

## 2023-08-22 DIAGNOSIS — F0150 Vascular dementia without behavioral disturbance: Secondary | ICD-10-CM | POA: Diagnosis not present

## 2023-08-22 DIAGNOSIS — I69351 Hemiplegia and hemiparesis following cerebral infarction affecting right dominant side: Secondary | ICD-10-CM | POA: Diagnosis not present

## 2023-08-22 DIAGNOSIS — Z9181 History of falling: Secondary | ICD-10-CM | POA: Diagnosis not present

## 2023-08-22 DIAGNOSIS — M6281 Muscle weakness (generalized): Secondary | ICD-10-CM | POA: Diagnosis not present

## 2023-08-23 DIAGNOSIS — I6930 Unspecified sequelae of cerebral infarction: Secondary | ICD-10-CM | POA: Insufficient documentation

## 2023-08-23 DIAGNOSIS — F01B0 Vascular dementia, moderate, without behavioral disturbance, psychotic disturbance, mood disturbance, and anxiety: Secondary | ICD-10-CM | POA: Insufficient documentation

## 2023-08-23 DIAGNOSIS — F39 Unspecified mood [affective] disorder: Secondary | ICD-10-CM | POA: Insufficient documentation

## 2023-08-23 NOTE — Assessment & Plan Note (Signed)
 Continues on synthroid , tsh at goal on last labs

## 2023-08-23 NOTE — Assessment & Plan Note (Signed)
 Controlled on remeron and cymbalta 

## 2023-08-23 NOTE — Assessment & Plan Note (Signed)
 Stable, no acute changes in cognitive or functional status, continue supportive care.

## 2023-08-23 NOTE — Assessment & Plan Note (Signed)
 Stable on schedule tylenol 

## 2023-08-23 NOTE — Assessment & Plan Note (Signed)
 Blood pressure well controlled, goal bp <140/90 Continue current medications and dietary modifications follow metabolic panel

## 2023-08-23 NOTE — Assessment & Plan Note (Signed)
 Stable, continues on lipitor. Continues with support from staff and family.

## 2023-08-23 NOTE — Assessment & Plan Note (Signed)
No current symptoms at this time

## 2023-08-23 NOTE — Assessment & Plan Note (Signed)
 Continues on lipitor 40 mg daily

## 2023-08-24 DIAGNOSIS — I69351 Hemiplegia and hemiparesis following cerebral infarction affecting right dominant side: Secondary | ICD-10-CM | POA: Diagnosis not present

## 2023-08-24 DIAGNOSIS — I129 Hypertensive chronic kidney disease with stage 1 through stage 4 chronic kidney disease, or unspecified chronic kidney disease: Secondary | ICD-10-CM | POA: Diagnosis not present

## 2023-08-24 DIAGNOSIS — M6281 Muscle weakness (generalized): Secondary | ICD-10-CM | POA: Diagnosis not present

## 2023-08-24 DIAGNOSIS — Z9181 History of falling: Secondary | ICD-10-CM | POA: Diagnosis not present

## 2023-08-24 DIAGNOSIS — F0150 Vascular dementia without behavioral disturbance: Secondary | ICD-10-CM | POA: Diagnosis not present

## 2023-08-26 DIAGNOSIS — Z9181 History of falling: Secondary | ICD-10-CM | POA: Diagnosis not present

## 2023-08-26 DIAGNOSIS — I129 Hypertensive chronic kidney disease with stage 1 through stage 4 chronic kidney disease, or unspecified chronic kidney disease: Secondary | ICD-10-CM | POA: Diagnosis not present

## 2023-08-26 DIAGNOSIS — I69351 Hemiplegia and hemiparesis following cerebral infarction affecting right dominant side: Secondary | ICD-10-CM | POA: Diagnosis not present

## 2023-08-26 DIAGNOSIS — M6281 Muscle weakness (generalized): Secondary | ICD-10-CM | POA: Diagnosis not present

## 2023-08-26 DIAGNOSIS — F0150 Vascular dementia without behavioral disturbance: Secondary | ICD-10-CM | POA: Diagnosis not present

## 2023-08-28 DIAGNOSIS — M6281 Muscle weakness (generalized): Secondary | ICD-10-CM | POA: Diagnosis not present

## 2023-08-28 DIAGNOSIS — F0150 Vascular dementia without behavioral disturbance: Secondary | ICD-10-CM | POA: Diagnosis not present

## 2023-08-28 DIAGNOSIS — I129 Hypertensive chronic kidney disease with stage 1 through stage 4 chronic kidney disease, or unspecified chronic kidney disease: Secondary | ICD-10-CM | POA: Diagnosis not present

## 2023-08-28 DIAGNOSIS — Z9181 History of falling: Secondary | ICD-10-CM | POA: Diagnosis not present

## 2023-08-28 DIAGNOSIS — I69351 Hemiplegia and hemiparesis following cerebral infarction affecting right dominant side: Secondary | ICD-10-CM | POA: Diagnosis not present

## 2023-08-29 DIAGNOSIS — I69351 Hemiplegia and hemiparesis following cerebral infarction affecting right dominant side: Secondary | ICD-10-CM | POA: Diagnosis not present

## 2023-08-29 DIAGNOSIS — F0150 Vascular dementia without behavioral disturbance: Secondary | ICD-10-CM | POA: Diagnosis not present

## 2023-08-29 DIAGNOSIS — Z9181 History of falling: Secondary | ICD-10-CM | POA: Diagnosis not present

## 2023-08-29 DIAGNOSIS — M6281 Muscle weakness (generalized): Secondary | ICD-10-CM | POA: Diagnosis not present

## 2023-08-29 DIAGNOSIS — I129 Hypertensive chronic kidney disease with stage 1 through stage 4 chronic kidney disease, or unspecified chronic kidney disease: Secondary | ICD-10-CM | POA: Diagnosis not present

## 2023-08-31 DIAGNOSIS — I129 Hypertensive chronic kidney disease with stage 1 through stage 4 chronic kidney disease, or unspecified chronic kidney disease: Secondary | ICD-10-CM | POA: Diagnosis not present

## 2023-08-31 DIAGNOSIS — I69351 Hemiplegia and hemiparesis following cerebral infarction affecting right dominant side: Secondary | ICD-10-CM | POA: Diagnosis not present

## 2023-08-31 DIAGNOSIS — M6281 Muscle weakness (generalized): Secondary | ICD-10-CM | POA: Diagnosis not present

## 2023-08-31 DIAGNOSIS — F0150 Vascular dementia without behavioral disturbance: Secondary | ICD-10-CM | POA: Diagnosis not present

## 2023-08-31 DIAGNOSIS — Z9181 History of falling: Secondary | ICD-10-CM | POA: Diagnosis not present

## 2023-09-01 ENCOUNTER — Non-Acute Institutional Stay (SKILLED_NURSING_FACILITY): Payer: Self-pay | Admitting: Student

## 2023-09-01 ENCOUNTER — Encounter: Payer: Self-pay | Admitting: Student

## 2023-09-01 DIAGNOSIS — M79675 Pain in left toe(s): Secondary | ICD-10-CM | POA: Diagnosis not present

## 2023-09-01 DIAGNOSIS — M6281 Muscle weakness (generalized): Secondary | ICD-10-CM | POA: Diagnosis not present

## 2023-09-01 DIAGNOSIS — I69351 Hemiplegia and hemiparesis following cerebral infarction affecting right dominant side: Secondary | ICD-10-CM | POA: Diagnosis not present

## 2023-09-01 DIAGNOSIS — I129 Hypertensive chronic kidney disease with stage 1 through stage 4 chronic kidney disease, or unspecified chronic kidney disease: Secondary | ICD-10-CM | POA: Diagnosis not present

## 2023-09-01 DIAGNOSIS — Z9181 History of falling: Secondary | ICD-10-CM | POA: Diagnosis not present

## 2023-09-01 DIAGNOSIS — F0150 Vascular dementia without behavioral disturbance: Secondary | ICD-10-CM | POA: Diagnosis not present

## 2023-09-01 NOTE — Progress Notes (Signed)
 Location:  Other [place].  Nursing Home Room Number: [facility] [address] Place of Service:  SNF [phone]) Provider:  Valrie Gehrig, MD  Patient Care Team: Valrie Gehrig, MD as PCP - General [facility] Medicine)  Extended Emergency Contact Information Primary Emergency Contact: Alex Andrew Address: [address]          [place], [place] [address] [place] Home Phone: [phone] Relation: Daughter Secondary Emergency Contact: Sameul Crew  [place] Home Phone: [phone] Mobile Phone: [phone] Relation: Son  Code Status:  DNR Goals of care: Advanced Directive information    07/05/2023   11:30 AM  Advanced Directives  Does Patient Have a Medical Advance Directive? Yes  Type of Advance Directive Out of facility DNR (pink MOST or yellow form)  Does patient want to make changes to medical advance directive? No - Patient declined     Chief Complaint  Patient presents with   Toe Pain    Toe Pain.     HPI:  Pt is a 89 y.o. female seen today for an acute visit for Toe Pain.  She says, it's that one. Pointing at left great toe.   Past Medical History:  Diagnosis Date   Cervicalgia    Depression    Essential hypertension    Hemorrhoids    History of MRSA infection    Hyperlipidemia    Hypertension    Hypothyroidism    IBS (irritable bowel syndrome)    Osteoarthritis of multiple joints    Restless leg    Senile osteoporosis    Shingles    Sleep apnea    Ulcerative colitis (HCC)    Urge incontinence    Past Surgical History:  Procedure Laterality Date   ABDOMINAL HYSTERECTOMY  1980   cataract surgery     CHOLECYSTECTOMY  1955   colon polyp removal  12/2012   ESOPHAGOGASTRODUODENOSCOPY  2014   makoplasty Left 06/2012   parotid gland removal  1984   VISCERAL ANGIOGRAPHY N/A 09/13/2017   Procedure: VISCERAL ANGIOGRAPHY;  Surgeon: Jackquelyn Mass, MD;  Location: [facility] INVASIVE CV LAB;  Service: Cardiovascular;   Laterality: N/A;    Allergies  Allergen Reactions   Codeine Sulfate Hives and Itching    Can take tramadol   Codeine Other (See Comments)    Outpatient Encounter Medications as of 09/01/2023  Medication Sig   acetaminophen  (TYLENOL ) 500 MG tablet Take 1,000 mg by mouth 2 (two) times daily.   alum & mag hydroxide-simeth (MAALOX/MYLANTA) 200-200-20 MG/5ML suspension Take 30 mLs by mouth every 4 (four) hours as needed for indigestion or heartburn.   atorvastatin  (LIPITOR) 40 MG tablet Take 1 tablet (40 mg total) by mouth daily at 6 PM.   Calcium  Carbonate-Vit D-Min (CALCIUM  600+D PLUS MINERALS) 600-400 MG-UNIT TABS Take 1 tablet by mouth daily.   Cholecalciferol (D3-50) 50000 units capsule Take 50,000 Units by mouth every 30 (thirty) days.   cyanocobalamin  1000 MCG tablet Take 1,000 mcg by mouth daily.   DULoxetine  (CYMBALTA ) 20 MG capsule Take 20 mg by mouth daily.   levothyroxine  (SYNTHROID , LEVOTHROID) 50 MCG tablet Take 50 mcg by mouth daily before breakfast.   losartan  (COZAAR ) 50 MG tablet Take 50 mg by mouth daily.    mirtazapine (REMERON) 7.5 MG tablet Take 7.5 mg by mouth at bedtime.   Multiple Vitamins-Minerals (MULTIVITAMIN ADULT PO) Take 1 tablet by mouth daily.    Zinc Oxide (TRIPLE PASTE) 12.8 % ointment Apply 1 Application topically. Every day and evening  shift. (Patient not taking: Reported on 09/01/2023)   No facility-administered encounter medications on file as of 09/01/2023.    Review of Systems  Immunization History  Administered Date(s) Administered   Influenza, High Dose Seasonal PF 04/05/2017   Influenza-Unspecified 02/04/2020, 02/01/2021, 02/01/2022, 02/08/2023   MODERNA COVID-19 SARS-COV-2 PEDS BIVALENT BOOSTER 26yr-31yr 04/29/2019, 05/27/2019, 02/25/2022   Moderna Covid-19 Fall Seasonal Vaccine 66yrs & older 07/26/2022   Moderna SARS-COV2 Booster Vaccination 09/04/2020   Pfizer Covid-19 Vaccine Bivalent Booster 77yrs & up 02/28/2020, 01/08/2021   Pneumococcal  Conjugate-13 03/07/2018   Pneumococcal Polysaccharide-23 03/11/2019   Tdap 09/14/2010   Unspecified SARS-COV-2 Vaccination 07/28/2023   Pertinent  Health Maintenance Due  Topic Date Due   INFLUENZA VACCINE  11/17/2023   DEXA SCAN  Discontinued      06/10/2022    2:55 PM 11/10/2022   11:48 AM  Fall Risk  Falls in the past year? 0 1  Was there an injury with Fall? 0 1  Fall Risk Category Calculator 0 3  Patient at Risk for Falls Due to No Fall Risks History of fall(s);Impaired balance/gait;Impaired mobility  Fall risk Follow up Falls evaluation completed Falls evaluation completed;Education provided   Functional Status Survey:    Vitals:   09/01/23 0916 09/01/23 0922  BP: (!) 144/62 134/80  Pulse: 62   Resp: 18   Temp: 97.8 F (36.6 C)   SpO2: 96%   Weight: 146 lb 12.8 oz (66.6 kg)   Height: 5' (1.524 m)    Body mass index is 28.67 kg/m. Physical Exam Skin:    Comments: Left great toe with granulation tissue at the base and slight bruising  Neurological:     Mental Status: She is alert.     Labs reviewed: Recent Labs    11/07/22 0000 02/20/23 0000 04/10/23 0000  NA 139 138 137  K 4.2 4.4 4.0  CL 105 105 6*  CO2 29* 29* 27*  BUN 17 24* 19  CREATININE 1.0 1.0 1.0  CALCIUM  8.7 9.0 8.9   Recent Labs    02/20/23 0000  AST 14  ALT 11  ALKPHOS 45  ALBUMIN 3.6   Recent Labs    01/19/23 0000 02/20/23 0000 04/10/23 0000 05/11/23 0000  WBC 6.7 6.1 7.0 5.3  NEUTROABS 4,750.00 3,739.00  --  2,846.00  HGB 11.4* 11.9* 12.9 12.1  HCT 36 38 41 38  PLT 141* 164 88* 106*   Lab Results  Component Value Date   TSH 1.40 02/20/2023   Lab Results  Component Value Date   HGBA1C 5.8 (H) 04/04/2017   Lab Results  Component Value Date   CHOL 130 02/20/2023   HDL 43 02/20/2023   LDLCALC 63 02/20/2023   TRIG 165 (A) 02/20/2023   CHOLHDL 6.3 04/04/2017    Significant Diagnostic Results in last 30 days:  No results found.  Assessment/Plan Pain of toe of  left foot Patient with left toe pain. Some visible granluation tissue at the base. Will plan for epsom salt soaks in tepid water for 7 days.   Family/ staff Communication: nursing  Labs/tests ordered:  none

## 2023-09-04 DIAGNOSIS — M6281 Muscle weakness (generalized): Secondary | ICD-10-CM | POA: Diagnosis not present

## 2023-09-04 DIAGNOSIS — Z9181 History of falling: Secondary | ICD-10-CM | POA: Diagnosis not present

## 2023-09-04 DIAGNOSIS — I129 Hypertensive chronic kidney disease with stage 1 through stage 4 chronic kidney disease, or unspecified chronic kidney disease: Secondary | ICD-10-CM | POA: Diagnosis not present

## 2023-09-04 DIAGNOSIS — F0150 Vascular dementia without behavioral disturbance: Secondary | ICD-10-CM | POA: Diagnosis not present

## 2023-09-04 DIAGNOSIS — I69351 Hemiplegia and hemiparesis following cerebral infarction affecting right dominant side: Secondary | ICD-10-CM | POA: Diagnosis not present

## 2023-09-05 DIAGNOSIS — M6281 Muscle weakness (generalized): Secondary | ICD-10-CM | POA: Diagnosis not present

## 2023-09-05 DIAGNOSIS — F0150 Vascular dementia without behavioral disturbance: Secondary | ICD-10-CM | POA: Diagnosis not present

## 2023-09-05 DIAGNOSIS — I69351 Hemiplegia and hemiparesis following cerebral infarction affecting right dominant side: Secondary | ICD-10-CM | POA: Diagnosis not present

## 2023-09-05 DIAGNOSIS — Z9181 History of falling: Secondary | ICD-10-CM | POA: Diagnosis not present

## 2023-09-05 DIAGNOSIS — I129 Hypertensive chronic kidney disease with stage 1 through stage 4 chronic kidney disease, or unspecified chronic kidney disease: Secondary | ICD-10-CM | POA: Diagnosis not present

## 2023-09-06 DIAGNOSIS — I129 Hypertensive chronic kidney disease with stage 1 through stage 4 chronic kidney disease, or unspecified chronic kidney disease: Secondary | ICD-10-CM | POA: Diagnosis not present

## 2023-09-06 DIAGNOSIS — Z9181 History of falling: Secondary | ICD-10-CM | POA: Diagnosis not present

## 2023-09-06 DIAGNOSIS — M6281 Muscle weakness (generalized): Secondary | ICD-10-CM | POA: Diagnosis not present

## 2023-09-06 DIAGNOSIS — I69351 Hemiplegia and hemiparesis following cerebral infarction affecting right dominant side: Secondary | ICD-10-CM | POA: Diagnosis not present

## 2023-09-06 DIAGNOSIS — F0150 Vascular dementia without behavioral disturbance: Secondary | ICD-10-CM | POA: Diagnosis not present

## 2023-09-07 DIAGNOSIS — I69351 Hemiplegia and hemiparesis following cerebral infarction affecting right dominant side: Secondary | ICD-10-CM | POA: Diagnosis not present

## 2023-09-07 DIAGNOSIS — F0150 Vascular dementia without behavioral disturbance: Secondary | ICD-10-CM | POA: Diagnosis not present

## 2023-09-07 DIAGNOSIS — I129 Hypertensive chronic kidney disease with stage 1 through stage 4 chronic kidney disease, or unspecified chronic kidney disease: Secondary | ICD-10-CM | POA: Diagnosis not present

## 2023-09-07 DIAGNOSIS — Z9181 History of falling: Secondary | ICD-10-CM | POA: Diagnosis not present

## 2023-09-07 DIAGNOSIS — M6281 Muscle weakness (generalized): Secondary | ICD-10-CM | POA: Diagnosis not present

## 2023-09-12 DIAGNOSIS — Z9181 History of falling: Secondary | ICD-10-CM | POA: Diagnosis not present

## 2023-09-12 DIAGNOSIS — I129 Hypertensive chronic kidney disease with stage 1 through stage 4 chronic kidney disease, or unspecified chronic kidney disease: Secondary | ICD-10-CM | POA: Diagnosis not present

## 2023-09-12 DIAGNOSIS — I69351 Hemiplegia and hemiparesis following cerebral infarction affecting right dominant side: Secondary | ICD-10-CM | POA: Diagnosis not present

## 2023-09-12 DIAGNOSIS — F0150 Vascular dementia without behavioral disturbance: Secondary | ICD-10-CM | POA: Diagnosis not present

## 2023-09-12 DIAGNOSIS — M6281 Muscle weakness (generalized): Secondary | ICD-10-CM | POA: Diagnosis not present

## 2023-09-14 DIAGNOSIS — M6281 Muscle weakness (generalized): Secondary | ICD-10-CM | POA: Diagnosis not present

## 2023-09-14 DIAGNOSIS — F0150 Vascular dementia without behavioral disturbance: Secondary | ICD-10-CM | POA: Diagnosis not present

## 2023-09-14 DIAGNOSIS — Z9181 History of falling: Secondary | ICD-10-CM | POA: Diagnosis not present

## 2023-09-14 DIAGNOSIS — I69351 Hemiplegia and hemiparesis following cerebral infarction affecting right dominant side: Secondary | ICD-10-CM | POA: Diagnosis not present

## 2023-09-14 DIAGNOSIS — I129 Hypertensive chronic kidney disease with stage 1 through stage 4 chronic kidney disease, or unspecified chronic kidney disease: Secondary | ICD-10-CM | POA: Diagnosis not present

## 2023-09-18 DIAGNOSIS — Z9181 History of falling: Secondary | ICD-10-CM | POA: Diagnosis not present

## 2023-09-18 DIAGNOSIS — I69320 Aphasia following cerebral infarction: Secondary | ICD-10-CM | POA: Diagnosis not present

## 2023-09-18 DIAGNOSIS — Z741 Need for assistance with personal care: Secondary | ICD-10-CM | POA: Diagnosis not present

## 2023-09-18 DIAGNOSIS — G2581 Restless legs syndrome: Secondary | ICD-10-CM | POA: Diagnosis not present

## 2023-09-18 DIAGNOSIS — I69351 Hemiplegia and hemiparesis following cerebral infarction affecting right dominant side: Secondary | ICD-10-CM | POA: Diagnosis not present

## 2023-09-18 DIAGNOSIS — R262 Difficulty in walking, not elsewhere classified: Secondary | ICD-10-CM | POA: Diagnosis not present

## 2023-09-18 DIAGNOSIS — N183 Chronic kidney disease, stage 3 unspecified: Secondary | ICD-10-CM | POA: Diagnosis not present

## 2023-09-18 DIAGNOSIS — M797 Fibromyalgia: Secondary | ICD-10-CM | POA: Diagnosis not present

## 2023-09-18 DIAGNOSIS — R2689 Other abnormalities of gait and mobility: Secondary | ICD-10-CM | POA: Diagnosis not present

## 2023-09-18 DIAGNOSIS — I10 Essential (primary) hypertension: Secondary | ICD-10-CM | POA: Diagnosis not present

## 2023-09-18 DIAGNOSIS — M6281 Muscle weakness (generalized): Secondary | ICD-10-CM | POA: Diagnosis not present

## 2023-09-18 DIAGNOSIS — R278 Other lack of coordination: Secondary | ICD-10-CM | POA: Diagnosis not present

## 2023-09-18 DIAGNOSIS — R2681 Unsteadiness on feet: Secondary | ICD-10-CM | POA: Diagnosis not present

## 2023-09-18 DIAGNOSIS — R4189 Other symptoms and signs involving cognitive functions and awareness: Secondary | ICD-10-CM | POA: Diagnosis not present

## 2023-09-18 DIAGNOSIS — F0150 Vascular dementia without behavioral disturbance: Secondary | ICD-10-CM | POA: Diagnosis not present

## 2023-09-18 DIAGNOSIS — I129 Hypertensive chronic kidney disease with stage 1 through stage 4 chronic kidney disease, or unspecified chronic kidney disease: Secondary | ICD-10-CM | POA: Diagnosis not present

## 2023-09-18 DIAGNOSIS — W19XXXA Unspecified fall, initial encounter: Secondary | ICD-10-CM | POA: Diagnosis not present

## 2023-09-18 DIAGNOSIS — R1312 Dysphagia, oropharyngeal phase: Secondary | ICD-10-CM | POA: Diagnosis not present

## 2023-09-18 DIAGNOSIS — I69920 Aphasia following unspecified cerebrovascular disease: Secondary | ICD-10-CM | POA: Diagnosis not present

## 2023-09-19 DIAGNOSIS — I129 Hypertensive chronic kidney disease with stage 1 through stage 4 chronic kidney disease, or unspecified chronic kidney disease: Secondary | ICD-10-CM | POA: Diagnosis not present

## 2023-09-19 DIAGNOSIS — F0150 Vascular dementia without behavioral disturbance: Secondary | ICD-10-CM | POA: Diagnosis not present

## 2023-09-19 DIAGNOSIS — I69351 Hemiplegia and hemiparesis following cerebral infarction affecting right dominant side: Secondary | ICD-10-CM | POA: Diagnosis not present

## 2023-09-19 DIAGNOSIS — I69920 Aphasia following unspecified cerebrovascular disease: Secondary | ICD-10-CM | POA: Diagnosis not present

## 2023-09-19 DIAGNOSIS — R262 Difficulty in walking, not elsewhere classified: Secondary | ICD-10-CM | POA: Diagnosis not present

## 2023-09-19 DIAGNOSIS — R4189 Other symptoms and signs involving cognitive functions and awareness: Secondary | ICD-10-CM | POA: Diagnosis not present

## 2023-09-21 ENCOUNTER — Non-Acute Institutional Stay (SKILLED_NURSING_FACILITY): Payer: Self-pay | Admitting: Adult Health

## 2023-09-21 ENCOUNTER — Encounter: Payer: Self-pay | Admitting: Adult Health

## 2023-09-21 DIAGNOSIS — I10 Essential (primary) hypertension: Secondary | ICD-10-CM

## 2023-09-21 DIAGNOSIS — R0989 Other specified symptoms and signs involving the circulatory and respiratory systems: Secondary | ICD-10-CM | POA: Diagnosis not present

## 2023-09-21 DIAGNOSIS — I69920 Aphasia following unspecified cerebrovascular disease: Secondary | ICD-10-CM | POA: Diagnosis not present

## 2023-09-21 DIAGNOSIS — F01B0 Vascular dementia, moderate, without behavioral disturbance, psychotic disturbance, mood disturbance, and anxiety: Secondary | ICD-10-CM | POA: Diagnosis not present

## 2023-09-21 DIAGNOSIS — R4189 Other symptoms and signs involving cognitive functions and awareness: Secondary | ICD-10-CM | POA: Diagnosis not present

## 2023-09-21 DIAGNOSIS — E039 Hypothyroidism, unspecified: Secondary | ICD-10-CM | POA: Diagnosis not present

## 2023-09-21 DIAGNOSIS — J069 Acute upper respiratory infection, unspecified: Secondary | ICD-10-CM | POA: Diagnosis not present

## 2023-09-21 DIAGNOSIS — I69351 Hemiplegia and hemiparesis following cerebral infarction affecting right dominant side: Secondary | ICD-10-CM | POA: Diagnosis not present

## 2023-09-21 DIAGNOSIS — F0150 Vascular dementia without behavioral disturbance: Secondary | ICD-10-CM | POA: Diagnosis not present

## 2023-09-21 DIAGNOSIS — R059 Cough, unspecified: Secondary | ICD-10-CM | POA: Diagnosis not present

## 2023-09-21 DIAGNOSIS — I129 Hypertensive chronic kidney disease with stage 1 through stage 4 chronic kidney disease, or unspecified chronic kidney disease: Secondary | ICD-10-CM | POA: Diagnosis not present

## 2023-09-21 DIAGNOSIS — R262 Difficulty in walking, not elsewhere classified: Secondary | ICD-10-CM | POA: Diagnosis not present

## 2023-09-21 LAB — BASIC METABOLIC PANEL WITH GFR
BUN: 17 (ref 4–21)
CO2: 26 — AB (ref 13–22)
Chloride: 103 (ref 99–108)
Creatinine: 0.9 (ref 0.5–1.1)
Glucose: 112
Potassium: 4.1 meq/L (ref 3.5–5.1)
Sodium: 136 — AB (ref 137–147)

## 2023-09-21 LAB — COMPREHENSIVE METABOLIC PANEL WITH GFR
Calcium: 9.3 (ref 8.7–10.7)
eGFR: 62

## 2023-09-21 LAB — CBC AND DIFFERENTIAL
HCT: 38 (ref 36–46)
Hemoglobin: 12.4 (ref 12.0–16.0)
Neutrophils Absolute: 5608
Platelets: 156 K/uL (ref 150–400)
WBC: 8

## 2023-09-21 LAB — CBC: RBC: 4.25 (ref 3.87–5.11)

## 2023-09-21 NOTE — Progress Notes (Signed)
 Location:  [facility]) Nursing Home Room Number: [facility]) Place of Service:  SNF (31) Provider:  Medina-Vargas, Kessler Kopinski, DNP, FNP-BC  Patient Care Team: Valrie Gehrig, MD as PCP - General (Family Medicine)  Extended Emergency Contact Information Primary Emergency Contact: Alex Andrew Address: [address]          [place], [place] [address] [place] Home Phone: [phone] Relation: Daughter Secondary Emergency Contact: Delroy Fields  [place] Home Phone: [phone] Mobile Phone: [phone] Relation: Son  Code Status:  DNR  Goals of care: Advanced Directive information    09/21/2023   12:20 PM  Advanced Directives  Does Patient Have a Medical Advance Directive? Yes  Type of Advance Directive Out of facility DNR (pink MOST or yellow form)  Does patient want to make changes to medical advance directive? No - Patient declined  Pre-existing out of facility DNR order (yellow form or pink MOST form) Pink MOST form placed in chart (order not valid for inpatient use);Yellow form placed in chart (order not valid for inpatient use)     Chief Complaint  Patient presents with   Cough    Cough     HPI:  Pt is a 89 y.o. female seen today for an acute visit regarding cough. She is a resident of [facility] She was reported to have been coughing for a week. No reported fever. Patient stated that she has poor appetite.  COVID-19 and influenza A/B test negative. Chest x-ray done showed mild interstitial prominence.  She takes losartan  50 mg daily for hypertension, latest BP 115/71.  She takes levothyroxine  50 mcg daily for hypothyroidism.  She has a vascular dementia with latest BIMS 9/15, ranging as moderate cognitive impairment.  Past Medical History:  Diagnosis Date   Cervicalgia    Depression    Essential hypertension    Hemorrhoids    History of MRSA infection    Hyperlipidemia     Hypertension    Hypothyroidism    IBS (irritable bowel syndrome)    Osteoarthritis of multiple joints    Restless leg    Senile osteoporosis    Shingles    Sleep apnea    Ulcerative colitis (HCC)    Urge incontinence    Past Surgical History:  Procedure Laterality Date   ABDOMINAL HYSTERECTOMY  1980   cataract surgery     CHOLECYSTECTOMY  1955   colon polyp removal  12/2012   ESOPHAGOGASTRODUODENOSCOPY  2014   makoplasty Left 06/2012   parotid gland removal  1984   VISCERAL ANGIOGRAPHY N/A 09/13/2017   Procedure: VISCERAL ANGIOGRAPHY;  Surgeon: Jackquelyn Mass, MD;  Location: [facility] INVASIVE CV LAB;  Service: Cardiovascular;  Laterality: N/A;    Allergies  Allergen Reactions   Codeine Sulfate Hives and Itching    Can take tramadol   Codeine Other (See Comments)    Outpatient Encounter Medications as of 09/21/2023  Medication Sig   acetaminophen  (TYLENOL ) 500 MG tablet Take 1,000 mg by mouth 2 (two) times daily.   alum & mag hydroxide-simeth (MAALOX/MYLANTA) 200-200-20 MG/5ML suspension Take 30 mLs by mouth every 4 (four) hours as needed for indigestion or heartburn.   atorvastatin  (LIPITOR) 40 MG tablet Take 1 tablet (40 mg total) by mouth daily at 6 PM.   Calcium  Carbonate-Vit D-Min (CALCIUM  600+D PLUS MINERALS) 600-400 MG-UNIT TABS Take 1 tablet by mouth daily.   Cholecalciferol (D3-50) 50000 units capsule Take 50,000 Units by mouth every  30 (thirty) days.   cyanocobalamin  1000 MCG tablet Take 1,000 mcg by mouth daily.   DULoxetine  (CYMBALTA ) 20 MG capsule Take 20 mg by mouth daily.   guaiFENesin (ROBITUSSIN) 100 MG/5ML liquid Take 5 mLs by mouth every 6 (six) hours as needed for cough or to loosen phlegm.   levothyroxine  (SYNTHROID , LEVOTHROID) 50 MCG tablet Take 50 mcg by mouth daily before breakfast.   losartan  (COZAAR ) 50 MG tablet Take 50 mg by mouth daily.    mirtazapine (REMERON) 7.5 MG tablet Take 7.5 mg by mouth at bedtime.   Multiple Vitamins-Minerals  (MULTIVITAMIN ADULT PO) Take 1 tablet by mouth daily.    Zinc Oxide (TRIPLE PASTE) 12.8 % ointment Apply 1 Application topically. Every day and evening shift. (Patient not taking: Reported on 09/21/2023)   No facility-administered encounter medications on file as of 09/21/2023.    Review of Systems  Constitutional:  Positive for appetite change. Negative for chills, fatigue and fever.  HENT:  Negative for congestion, hearing loss, rhinorrhea and sore throat.   Eyes: Negative.   Respiratory:  Positive for cough. Negative for shortness of breath and wheezing.   Cardiovascular:  Negative for chest pain, palpitations and leg swelling.  Gastrointestinal:  Negative for abdominal pain, constipation, diarrhea, nausea and vomiting.  Genitourinary:  Negative for dysuria.  Musculoskeletal:  Negative for arthralgias, back pain and myalgias.  Skin:  Negative for color change, rash and wound.  Neurological:  Negative for dizziness, weakness and headaches.  Psychiatric/Behavioral:  Negative for behavioral problems. The patient is not nervous/anxious.       Immunization History  Administered Date(s) Administered   Influenza, High Dose Seasonal PF 04/05/2017   Influenza-Unspecified 02/04/2020, 02/01/2021, 02/01/2022, 02/08/2023   MODERNA COVID-19 SARS-COV-2 PEDS BIVALENT BOOSTER 26yr-85yr 04/29/2019, 05/27/2019, 02/25/2022   Moderna Covid-19 Fall Seasonal Vaccine 25yrs & older 07/26/2022   Moderna SARS-COV2 Booster Vaccination 09/04/2020   Pfizer Covid-19 Vaccine Bivalent Booster 65yrs & up 02/28/2020, 01/08/2021   Pneumococcal Conjugate-13 03/07/2018   Pneumococcal Polysaccharide-23 03/11/2019   Tdap 09/14/2010   Unspecified SARS-COV-2 Vaccination 07/28/2023   Pertinent  Health Maintenance Due  Topic Date Due   INFLUENZA VACCINE  11/17/2023   DEXA SCAN  Discontinued      06/10/2022    2:55 PM 11/10/2022   11:48 AM  Fall Risk  Falls in the past year? 0 1  Was there an injury with Fall? 0 1   Fall Risk Category Calculator 0 3  Patient at Risk for Falls Due to No Fall Risks History of fall(s);Impaired balance/gait;Impaired mobility  Fall risk Follow up Falls evaluation completed Falls evaluation completed;Education provided     Vitals:   09/21/23 1221  BP: 115/71  Pulse: 83  Resp: 18  Temp: 97.8 F (36.6 C)  SpO2: 96%  Weight: 147 lb 9.6 oz (67 kg)  Height: 5' (1.524 m)   Body mass index is 28.83 kg/m.  Physical Exam Constitutional:      General: She is not in acute distress.    Appearance: Normal appearance.  HENT:     Head: Normocephalic and atraumatic.     Nose: Nose normal.     Mouth/Throat:     Mouth: Mucous membranes are moist.  Eyes:     Conjunctiva/sclera: Conjunctivae normal.  Cardiovascular:     Rate and Rhythm: Normal rate and regular rhythm.  Pulmonary:     Effort: Pulmonary effort is normal.     Breath sounds: Normal breath sounds.  Abdominal:  General: Bowel sounds are normal.     Palpations: Abdomen is soft.  Musculoskeletal:        General: Normal range of motion.     Cervical back: Normal range of motion.  Neurological:     Mental Status: She is alert.  Psychiatric:        Mood and Affect: Mood normal.        Behavior: Behavior normal.        Labs reviewed: Recent Labs    11/07/22 0000 02/20/23 0000 04/10/23 0000  NA 139 138 137  K 4.2 4.4 4.0  CL 105 105 6*  CO2 29* 29* 27*  BUN 17 24* 19  CREATININE 1.0 1.0 1.0  CALCIUM  8.7 9.0 8.9   Recent Labs    02/20/23 0000  AST 14  ALT 11  ALKPHOS 45  ALBUMIN 3.6   Recent Labs    01/19/23 0000 02/20/23 0000 04/10/23 0000 05/11/23 0000  WBC 6.7 6.1 7.0 5.3  NEUTROABS 4,750.00 3,739.00  --  2,846.00  HGB 11.4* 11.9* 12.9 12.1  HCT 36 38 41 38  PLT 141* 164 88* 106*   Lab Results  Component Value Date   TSH 1.40 02/20/2023   Lab Results  Component Value Date   HGBA1C 5.8 (H) 04/04/2017   Lab Results  Component Value Date   CHOL 130 02/20/2023   HDL  43 02/20/2023   LDLCALC 63 02/20/2023   TRIG 165 (A) 02/20/2023   CHOLHDL 6.3 04/04/2017    Significant Diagnostic Results in last 30 days:  No results found.  Assessment/Plan  1. Upper respiratory tract infection, unspecified type (Primary) - Has productive cough x 1 week -   Chest x-ray mild interstitial prominence - Will start on doxycycline 100 mg 1 tablet twice a day x 10 days   - DC guaifenesin as needed -   Start guaifenesin 100 mg / 5 mL twice daily 10 mL 3 times a day x 1 week  2. Primary hypertension -   BP 11571, stable -    Continue losartan  50 mg daily  3. Adult hypothyroidism Lab Results  Component Value Date   TSH 1.40 02/20/2023    -   Continue levothyroxine  50 mcg daily  4. Moderate vascular dementia without behavioral disturbance, psychotic disturbance, mood disturbance, or anxiety (HCC) -   BIMS score 9/15, ranging as moderate cognitive impairment -   Continue supportive care -   Fall precautions    Family/ staff Communication: Discussed plan of care with resident and charge nurse  Labs/tests ordered: BMP, CBC with differentials, chest x-ray PA and lateral    Inge Mangle, DNP, MSN, FNP-BC [facility] [phone] (Monday-Friday 8:00 a.m. - 5:00 p.m.) [phone] (after hours)

## 2023-09-23 DIAGNOSIS — I69351 Hemiplegia and hemiparesis following cerebral infarction affecting right dominant side: Secondary | ICD-10-CM | POA: Diagnosis not present

## 2023-09-23 DIAGNOSIS — I129 Hypertensive chronic kidney disease with stage 1 through stage 4 chronic kidney disease, or unspecified chronic kidney disease: Secondary | ICD-10-CM | POA: Diagnosis not present

## 2023-09-23 DIAGNOSIS — R262 Difficulty in walking, not elsewhere classified: Secondary | ICD-10-CM | POA: Diagnosis not present

## 2023-09-23 DIAGNOSIS — F0150 Vascular dementia without behavioral disturbance: Secondary | ICD-10-CM | POA: Diagnosis not present

## 2023-09-23 DIAGNOSIS — R4189 Other symptoms and signs involving cognitive functions and awareness: Secondary | ICD-10-CM | POA: Diagnosis not present

## 2023-09-23 DIAGNOSIS — I69920 Aphasia following unspecified cerebrovascular disease: Secondary | ICD-10-CM | POA: Diagnosis not present

## 2023-09-25 DIAGNOSIS — F0150 Vascular dementia without behavioral disturbance: Secondary | ICD-10-CM | POA: Diagnosis not present

## 2023-09-25 DIAGNOSIS — R4189 Other symptoms and signs involving cognitive functions and awareness: Secondary | ICD-10-CM | POA: Diagnosis not present

## 2023-09-25 DIAGNOSIS — I69351 Hemiplegia and hemiparesis following cerebral infarction affecting right dominant side: Secondary | ICD-10-CM | POA: Diagnosis not present

## 2023-09-25 DIAGNOSIS — I129 Hypertensive chronic kidney disease with stage 1 through stage 4 chronic kidney disease, or unspecified chronic kidney disease: Secondary | ICD-10-CM | POA: Diagnosis not present

## 2023-09-25 DIAGNOSIS — I69920 Aphasia following unspecified cerebrovascular disease: Secondary | ICD-10-CM | POA: Diagnosis not present

## 2023-09-25 DIAGNOSIS — R262 Difficulty in walking, not elsewhere classified: Secondary | ICD-10-CM | POA: Diagnosis not present

## 2023-09-26 DIAGNOSIS — F0150 Vascular dementia without behavioral disturbance: Secondary | ICD-10-CM | POA: Diagnosis not present

## 2023-09-26 DIAGNOSIS — I69351 Hemiplegia and hemiparesis following cerebral infarction affecting right dominant side: Secondary | ICD-10-CM | POA: Diagnosis not present

## 2023-09-26 DIAGNOSIS — I69920 Aphasia following unspecified cerebrovascular disease: Secondary | ICD-10-CM | POA: Diagnosis not present

## 2023-09-26 DIAGNOSIS — I129 Hypertensive chronic kidney disease with stage 1 through stage 4 chronic kidney disease, or unspecified chronic kidney disease: Secondary | ICD-10-CM | POA: Diagnosis not present

## 2023-09-26 DIAGNOSIS — R4189 Other symptoms and signs involving cognitive functions and awareness: Secondary | ICD-10-CM | POA: Diagnosis not present

## 2023-09-26 DIAGNOSIS — R262 Difficulty in walking, not elsewhere classified: Secondary | ICD-10-CM | POA: Diagnosis not present

## 2023-09-27 DIAGNOSIS — R4189 Other symptoms and signs involving cognitive functions and awareness: Secondary | ICD-10-CM | POA: Diagnosis not present

## 2023-09-27 DIAGNOSIS — I69920 Aphasia following unspecified cerebrovascular disease: Secondary | ICD-10-CM | POA: Diagnosis not present

## 2023-09-27 DIAGNOSIS — I129 Hypertensive chronic kidney disease with stage 1 through stage 4 chronic kidney disease, or unspecified chronic kidney disease: Secondary | ICD-10-CM | POA: Diagnosis not present

## 2023-09-27 DIAGNOSIS — R262 Difficulty in walking, not elsewhere classified: Secondary | ICD-10-CM | POA: Diagnosis not present

## 2023-09-27 DIAGNOSIS — I69351 Hemiplegia and hemiparesis following cerebral infarction affecting right dominant side: Secondary | ICD-10-CM | POA: Diagnosis not present

## 2023-09-27 DIAGNOSIS — F0150 Vascular dementia without behavioral disturbance: Secondary | ICD-10-CM | POA: Diagnosis not present

## 2023-09-28 DIAGNOSIS — I69920 Aphasia following unspecified cerebrovascular disease: Secondary | ICD-10-CM | POA: Diagnosis not present

## 2023-09-28 DIAGNOSIS — R4189 Other symptoms and signs involving cognitive functions and awareness: Secondary | ICD-10-CM | POA: Diagnosis not present

## 2023-09-28 DIAGNOSIS — I69351 Hemiplegia and hemiparesis following cerebral infarction affecting right dominant side: Secondary | ICD-10-CM | POA: Diagnosis not present

## 2023-09-28 DIAGNOSIS — F0150 Vascular dementia without behavioral disturbance: Secondary | ICD-10-CM | POA: Diagnosis not present

## 2023-09-28 DIAGNOSIS — R262 Difficulty in walking, not elsewhere classified: Secondary | ICD-10-CM | POA: Diagnosis not present

## 2023-09-28 DIAGNOSIS — I129 Hypertensive chronic kidney disease with stage 1 through stage 4 chronic kidney disease, or unspecified chronic kidney disease: Secondary | ICD-10-CM | POA: Diagnosis not present

## 2023-09-29 DIAGNOSIS — R4189 Other symptoms and signs involving cognitive functions and awareness: Secondary | ICD-10-CM | POA: Diagnosis not present

## 2023-09-29 DIAGNOSIS — I129 Hypertensive chronic kidney disease with stage 1 through stage 4 chronic kidney disease, or unspecified chronic kidney disease: Secondary | ICD-10-CM | POA: Diagnosis not present

## 2023-09-29 DIAGNOSIS — I69920 Aphasia following unspecified cerebrovascular disease: Secondary | ICD-10-CM | POA: Diagnosis not present

## 2023-09-29 DIAGNOSIS — F0150 Vascular dementia without behavioral disturbance: Secondary | ICD-10-CM | POA: Diagnosis not present

## 2023-09-29 DIAGNOSIS — R262 Difficulty in walking, not elsewhere classified: Secondary | ICD-10-CM | POA: Diagnosis not present

## 2023-09-29 DIAGNOSIS — I69351 Hemiplegia and hemiparesis following cerebral infarction affecting right dominant side: Secondary | ICD-10-CM | POA: Diagnosis not present

## 2023-10-02 DIAGNOSIS — R262 Difficulty in walking, not elsewhere classified: Secondary | ICD-10-CM | POA: Diagnosis not present

## 2023-10-02 DIAGNOSIS — F0150 Vascular dementia without behavioral disturbance: Secondary | ICD-10-CM | POA: Diagnosis not present

## 2023-10-02 DIAGNOSIS — R4189 Other symptoms and signs involving cognitive functions and awareness: Secondary | ICD-10-CM | POA: Diagnosis not present

## 2023-10-02 DIAGNOSIS — I69920 Aphasia following unspecified cerebrovascular disease: Secondary | ICD-10-CM | POA: Diagnosis not present

## 2023-10-02 DIAGNOSIS — I129 Hypertensive chronic kidney disease with stage 1 through stage 4 chronic kidney disease, or unspecified chronic kidney disease: Secondary | ICD-10-CM | POA: Diagnosis not present

## 2023-10-02 DIAGNOSIS — I69351 Hemiplegia and hemiparesis following cerebral infarction affecting right dominant side: Secondary | ICD-10-CM | POA: Diagnosis not present

## 2023-10-03 DIAGNOSIS — I69920 Aphasia following unspecified cerebrovascular disease: Secondary | ICD-10-CM | POA: Diagnosis not present

## 2023-10-03 DIAGNOSIS — I129 Hypertensive chronic kidney disease with stage 1 through stage 4 chronic kidney disease, or unspecified chronic kidney disease: Secondary | ICD-10-CM | POA: Diagnosis not present

## 2023-10-03 DIAGNOSIS — R262 Difficulty in walking, not elsewhere classified: Secondary | ICD-10-CM | POA: Diagnosis not present

## 2023-10-03 DIAGNOSIS — F0150 Vascular dementia without behavioral disturbance: Secondary | ICD-10-CM | POA: Diagnosis not present

## 2023-10-03 DIAGNOSIS — R4189 Other symptoms and signs involving cognitive functions and awareness: Secondary | ICD-10-CM | POA: Diagnosis not present

## 2023-10-03 DIAGNOSIS — I69351 Hemiplegia and hemiparesis following cerebral infarction affecting right dominant side: Secondary | ICD-10-CM | POA: Diagnosis not present

## 2023-10-05 DIAGNOSIS — R4189 Other symptoms and signs involving cognitive functions and awareness: Secondary | ICD-10-CM | POA: Diagnosis not present

## 2023-10-05 DIAGNOSIS — I129 Hypertensive chronic kidney disease with stage 1 through stage 4 chronic kidney disease, or unspecified chronic kidney disease: Secondary | ICD-10-CM | POA: Diagnosis not present

## 2023-10-05 DIAGNOSIS — I69920 Aphasia following unspecified cerebrovascular disease: Secondary | ICD-10-CM | POA: Diagnosis not present

## 2023-10-05 DIAGNOSIS — F0150 Vascular dementia without behavioral disturbance: Secondary | ICD-10-CM | POA: Diagnosis not present

## 2023-10-05 DIAGNOSIS — R262 Difficulty in walking, not elsewhere classified: Secondary | ICD-10-CM | POA: Diagnosis not present

## 2023-10-05 DIAGNOSIS — I69351 Hemiplegia and hemiparesis following cerebral infarction affecting right dominant side: Secondary | ICD-10-CM | POA: Diagnosis not present

## 2023-10-09 DIAGNOSIS — R262 Difficulty in walking, not elsewhere classified: Secondary | ICD-10-CM | POA: Diagnosis not present

## 2023-10-09 DIAGNOSIS — I129 Hypertensive chronic kidney disease with stage 1 through stage 4 chronic kidney disease, or unspecified chronic kidney disease: Secondary | ICD-10-CM | POA: Diagnosis not present

## 2023-10-09 DIAGNOSIS — F0150 Vascular dementia without behavioral disturbance: Secondary | ICD-10-CM | POA: Diagnosis not present

## 2023-10-09 DIAGNOSIS — R4189 Other symptoms and signs involving cognitive functions and awareness: Secondary | ICD-10-CM | POA: Diagnosis not present

## 2023-10-09 DIAGNOSIS — I69351 Hemiplegia and hemiparesis following cerebral infarction affecting right dominant side: Secondary | ICD-10-CM | POA: Diagnosis not present

## 2023-10-09 DIAGNOSIS — I69920 Aphasia following unspecified cerebrovascular disease: Secondary | ICD-10-CM | POA: Diagnosis not present

## 2023-10-13 DIAGNOSIS — R262 Difficulty in walking, not elsewhere classified: Secondary | ICD-10-CM | POA: Diagnosis not present

## 2023-10-13 DIAGNOSIS — I69920 Aphasia following unspecified cerebrovascular disease: Secondary | ICD-10-CM | POA: Diagnosis not present

## 2023-10-13 DIAGNOSIS — R4189 Other symptoms and signs involving cognitive functions and awareness: Secondary | ICD-10-CM | POA: Diagnosis not present

## 2023-10-13 DIAGNOSIS — I129 Hypertensive chronic kidney disease with stage 1 through stage 4 chronic kidney disease, or unspecified chronic kidney disease: Secondary | ICD-10-CM | POA: Diagnosis not present

## 2023-10-13 DIAGNOSIS — I69351 Hemiplegia and hemiparesis following cerebral infarction affecting right dominant side: Secondary | ICD-10-CM | POA: Diagnosis not present

## 2023-10-13 DIAGNOSIS — F0150 Vascular dementia without behavioral disturbance: Secondary | ICD-10-CM | POA: Diagnosis not present

## 2023-11-01 ENCOUNTER — Encounter: Payer: Self-pay | Admitting: Student

## 2023-11-01 ENCOUNTER — Non-Acute Institutional Stay: Payer: Self-pay | Admitting: Student

## 2023-11-01 NOTE — Progress Notes (Signed)
 A user error has taken place: encounter opened in error, closed for administrative reasons.

## 2023-11-05 NOTE — Addendum Note (Signed)
 Addended by: Jaiveer Panas on: 11/05/2023 06:38 PM   Modules accepted: Level of Service

## 2023-11-15 ENCOUNTER — Encounter: Payer: Self-pay | Admitting: Student

## 2023-11-15 ENCOUNTER — Non-Acute Institutional Stay (SKILLED_NURSING_FACILITY): Payer: Self-pay | Admitting: Student

## 2023-11-15 DIAGNOSIS — W19XXXA Unspecified fall, initial encounter: Secondary | ICD-10-CM | POA: Diagnosis not present

## 2023-11-15 DIAGNOSIS — R4701 Aphasia: Secondary | ICD-10-CM | POA: Diagnosis not present

## 2023-11-15 NOTE — Progress Notes (Signed)
 Location:  Other [place].  Nursing Home Room Number: [facility] [id number] Place of Service:  SNF [phone]) Provider:  [name] Fine, MD  Patient Care Team: [name] Fine, MD as PCP - General [facility])  Extended Emergency Contact Information Primary Emergency Contact: [name] Address: [address]          [place], [place] [address] [place] Home Phone: [phone] Relation: Daughter Secondary Emergency Contact: [name]  [place] Home Phone: [phone] Mobile Phone: [phone] Relation: Son  Code Status:  DNR Goals of care: Advanced Directive information    09/21/2023   12:20 PM  Advanced Directives  Does Patient Have a Medical Advance Directive? Yes  Type of Advance Directive Out of facility DNR (pink MOST or yellow form)  Does patient want to make changes to medical advance directive? No - Patient declined  Pre-existing out of facility DNR order (yellow form or pink MOST form) Pink MOST form placed in chart (order not valid for inpatient use);Yellow form placed in chart (order not valid for inpatient use)     Chief Complaint  Patient presents with   Emesis    Vomiting and Follow up Fall.     HPI:  Pt is a 89 y.o. female seen today for an acute visit for Vomiting and Follow up Fall.  History of Present Illness The patient presents with a recent fall and subsequent vomiting.  She experienced a fall while being assisted by her daughter during a transfer, possibly due to misjudging the timing to sit and appearing weak. During the fall, she attempted to grab the wheelchair but was unable to prevent it.  Following the fall, she vomited once last night. No vomiting or diarrhea today. No current nausea, and she was able to eat breakfast this morning.  She has expressive aphasia, making it challenging to assess her concerns accurately.  Her daughter mentioned recent changes in the CNA, which might have  contributed to the fall due to a lack of consistent cueing during transfers.    Past Medical History:  Diagnosis Date   Cervicalgia    Depression    Essential hypertension    Hemorrhoids    History of MRSA infection    Hyperlipidemia    Hypertension    Hypothyroidism    IBS (irritable bowel syndrome)    Osteoarthritis of multiple joints    Restless leg    Senile osteoporosis    Shingles    Sleep apnea    Ulcerative colitis (HCC)    Urge incontinence    Past Surgical History:  Procedure Laterality Date   ABDOMINAL HYSTERECTOMY  1980   cataract surgery     CHOLECYSTECTOMY  1955   colon polyp removal  12/2012   ESOPHAGOGASTRODUODENOSCOPY  2014   makoplasty Left 06/2012   parotid gland removal  1984   VISCERAL ANGIOGRAPHY N/A 09/13/2017   Procedure: VISCERAL ANGIOGRAPHY;  Surgeon: Jama Cordella MATSU, MD;  Location: [facility] INVASIVE CV LAB;  Service: Cardiovascular;  Laterality: N/A;    Allergies  Allergen Reactions   Codeine Sulfate Hives and Itching    Can take tramadol   Codeine Other (See Comments)    Outpatient Encounter Medications as of 11/15/2023  Medication Sig   acetaminophen  (TYLENOL ) 500 MG tablet Take 1,000 mg by mouth 2 (two) times daily.   alum & mag hydroxide-simeth (MAALOX/MYLANTA) 200-200-20 MG/5ML suspension Take 30 mLs by mouth every 4 (four) hours as needed for indigestion or heartburn.   atorvastatin  (LIPITOR)  40 MG tablet Take 1 tablet (40 mg total) by mouth daily at 6 PM.   Calcium  Carbonate-Vit D-Min (CALCIUM  600+D PLUS MINERALS) 600-400 MG-UNIT TABS Take 1 tablet by mouth daily.   Cholecalciferol (D3-50) 50000 units capsule Take 50,000 Units by mouth every 30 (thirty) days.   cyanocobalamin  1000 MCG tablet Take 1,000 mcg by mouth daily.   DULoxetine  (CYMBALTA ) 20 MG capsule Take 20 mg by mouth daily.   levothyroxine  (SYNTHROID , LEVOTHROID) 50 MCG tablet Take 50 mcg by mouth daily before breakfast.   losartan  (COZAAR ) 50 MG tablet Take 50 mg by  mouth daily.    mirtazapine (REMERON) 7.5 MG tablet Take 7.5 mg by mouth at bedtime.   Multiple Vitamins-Minerals (MULTIVITAMIN ADULT PO) Take 1 tablet by mouth daily.    triamcinolone  cream (KENALOG ) 0.1 % Apply 1 Application topically 2 (two) times daily. (Patient not taking: Reported on 11/15/2023)   Zinc Oxide (TRIPLE PASTE) 12.8 % ointment Apply 1 Application topically. Every day and evening shift. (Patient not taking: Reported on 11/15/2023)   No facility-administered encounter medications on file as of 11/15/2023.    Review of Systems  Immunization History  Administered Date(s) Administered   Influenza, High Dose Seasonal PF 04/05/2017   Influenza-Unspecified 02/04/2020, 02/01/2021, 02/01/2022, 02/08/2023   MODERNA COVID-19 SARS-COV-2 PEDS BIVALENT BOOSTER 52yr-74yr 04/29/2019, 05/27/2019, 02/25/2022   Moderna Covid-19 Fall Seasonal Vaccine 26yrs & older 07/26/2022   Moderna SARS-COV2 Booster Vaccination 09/04/2020   Pfizer Covid-19 Vaccine Bivalent Booster 12yrs & up 02/28/2020, 01/08/2021   Pneumococcal Conjugate-13 03/07/2018   Pneumococcal Polysaccharide-23 03/11/2019   Tdap 09/14/2010   Unspecified SARS-COV-2 Vaccination 07/28/2023   Pertinent  Health Maintenance Due  Topic Date Due   INFLUENZA VACCINE  11/17/2023   DEXA SCAN  Discontinued      06/10/2022    2:55 PM 11/10/2022   11:48 AM  Fall Risk  Falls in the past year? 0 1  Was there an injury with Fall? 0 1  Fall Risk Category Calculator 0 3  Patient at Risk for Falls Due to No Fall Risks History of fall(s);Impaired balance/gait;Impaired mobility  Fall risk Follow up Falls evaluation completed Falls evaluation completed;Education provided   Functional Status Survey:    Vitals:   11/15/23 1005  BP: 132/66  Pulse: 84  Resp: 18  Temp: (!) 97.2 F (36.2 C)  SpO2: 98%  Weight: 149 lb (67.6 kg)  Height: 5' (1.524 m)   Body mass index is 29.1 kg/m. Physical Exam GENERAL: No acute distress, well developed,  well nourished. HEENT: Normocephalic, atraumatic. Pupils equal, round, and reactive to light. CHEST: Lungs clear to auscultation bilaterally. NEUROLOGICAL: Extraocular movements intact. SKIN: Scratch on right lower back, no bruising.  Labs reviewed: Recent Labs    02/20/23 0000 04/10/23 0000 09/21/23 0000  NA 138 137 136*  K 4.4 4.0 4.1  CL 105 6* 103  CO2 29* 27* 26*  BUN 24* 19 17  CREATININE 1.0 1.0 0.9  CALCIUM  9.0 8.9 9.3   Recent Labs    02/20/23 0000  AST 14  ALT 11  ALKPHOS 45  ALBUMIN 3.6   Recent Labs    02/20/23 0000 04/10/23 0000 05/11/23 0000 09/21/23 0000  WBC 6.1 7.0 5.3 8.0  NEUTROABS 3,739.00  --  2,846.00 5,608.00  HGB 11.9* 12.9 12.1 12.4  HCT 38 41 38 38  PLT 164 88* 106* 156   Lab Results  Component Value Date   TSH 1.40 02/20/2023   Lab Results  Component Value  Date   HGBA1C 5.8 (H) 04/04/2017   Lab Results  Component Value Date   CHOL 130 02/20/2023   HDL 43 02/20/2023   LDLCALC 63 02/20/2023   TRIG 165 (A) 02/20/2023   CHOLHDL 6.3 04/04/2017    Significant Diagnostic Results in last 30 days:  No results found.  Assessment/Plan Recent fall without injury A fall occurred during a transfer due to misjudgment in timing, resulting in a small scratch on the right lower back. No significant injuries, bruising, or tenderness were present. Vomiting occurred once last night, but no vomiting or diarrhea today. No signs of concussion or head injury. Neurological exam was reassuring with clear lung sounds and reactive pupils. - Write a physical therapy consult to assess and assist with mobility and transfer techniques. - Ensure caregivers provide additional cueing during transfers to prevent future falls.  Expressive aphasia Expressive aphasia is present, complicating accurate assessment of her concerns. No specific changes in her condition related to aphasia were discussed during this visit.   Family/ staff Communication: nursing,  daughter kim  Labs/tests ordered:  none

## 2023-11-23 ENCOUNTER — Non-Acute Institutional Stay (SKILLED_NURSING_FACILITY): Payer: Self-pay | Admitting: Nurse Practitioner

## 2023-11-23 ENCOUNTER — Encounter: Payer: Self-pay | Admitting: Nurse Practitioner

## 2023-11-23 DIAGNOSIS — Z0000 Encounter for general adult medical examination without abnormal findings: Secondary | ICD-10-CM | POA: Diagnosis not present

## 2023-11-23 NOTE — Progress Notes (Signed)
 Subjective:   Kaitlyn Ramos is a 89 y.o. female who presents for Medicare Annual (Subsequent) preventive examination.  Visit Complete: In person SNF TL   Cardiac Risk Factors include: advanced age (>27men, >62 women);sedentary lifestyle;hypertension;dyslipidemia     Objective:    Today's Vitals   11/23/23 1446 11/23/23 1512  BP: (!) 148/77 (!) 145/74  Pulse: 72   Resp: 18   Temp: 98 F (36.7 C)   SpO2: 97%   Weight: 149 lb (67.6 kg)   Height: 5' (1.524 m)    Body mass index is 29.1 kg/m.     11/23/2023    3:12 PM 09/21/2023   12:20 PM 07/05/2023   11:30 AM 04/25/2023   12:26 PM 04/10/2023    3:37 PM 01/03/2023    2:53 PM 11/10/2022   10:34 AM  Advanced Directives  Does Patient Have a Medical Advance Directive? Yes Yes Yes Yes Yes Yes Yes  Type of Advance Directive Out of facility DNR (pink MOST or yellow form) Out of facility DNR (pink MOST or yellow form) Out of facility DNR (pink MOST or yellow form) Out of facility DNR (pink MOST or yellow form) Out of facility DNR (pink MOST or yellow form) Out of facility DNR (pink MOST or yellow form) Out of facility DNR (pink MOST or yellow form)  Does patient want to make changes to medical advance directive? No - Patient declined No - Patient declined No - Patient declined No - Patient declined No - Patient declined No - Patient declined No - Patient declined  Pre-existing out of facility DNR order (yellow form or pink MOST form)  Pink MOST form placed in chart (order not valid for inpatient use);Yellow form placed in chart (order not valid for inpatient use)   Yellow form placed in chart (order not valid for inpatient use)      Current Medications (verified) Outpatient Encounter Medications as of 11/23/2023  Medication Sig   acetaminophen  (TYLENOL ) 500 MG tablet Take 1,000 mg by mouth 2 (two) times daily.   alum & mag hydroxide-simeth (MAALOX/MYLANTA) 200-200-20 MG/5ML suspension Take 30 mLs by mouth every 4 (four) hours as needed  for indigestion or heartburn.   atorvastatin  (LIPITOR) 40 MG tablet Take 1 tablet (40 mg total) by mouth daily at 6 PM.   Calcium  Carbonate-Vit D-Min (CALCIUM  600+D PLUS MINERALS) 600-400 MG-UNIT TABS Take 1 tablet by mouth daily.   Cholecalciferol (D3-50) 50000 units capsule Take 50,000 Units by mouth every 30 (thirty) days.   cyanocobalamin  1000 MCG tablet Take 1,000 mcg by mouth daily.   DULoxetine  (CYMBALTA ) 20 MG capsule Take 20 mg by mouth daily.   levothyroxine  (SYNTHROID , LEVOTHROID) 50 MCG tablet Take 50 mcg by mouth daily before breakfast.   losartan  (COZAAR ) 50 MG tablet Take 50 mg by mouth daily.    mirtazapine (REMERON) 7.5 MG tablet Take 7.5 mg by mouth at bedtime.   Multiple Vitamins-Minerals (MULTIVITAMIN ADULT PO) Take 1 tablet by mouth daily.    No facility-administered encounter medications on file as of 11/23/2023.    Allergies (verified) Codeine sulfate and Codeine   History: Past Medical History:  Diagnosis Date   Cervicalgia    Depression    Essential hypertension    Hemorrhoids    History of MRSA infection    Hyperlipidemia    Hypertension    Hypothyroidism    IBS (irritable bowel syndrome)    Osteoarthritis of multiple joints    Restless leg    Senile osteoporosis  Shingles    Sleep apnea    Ulcerative colitis (HCC)    Urge incontinence    Past Surgical History:  Procedure Laterality Date   ABDOMINAL HYSTERECTOMY  1980   cataract surgery     CHOLECYSTECTOMY  1955   colon polyp removal  12/2012   ESOPHAGOGASTRODUODENOSCOPY  2014   makoplasty Left 06/2012   parotid gland removal  1984   VISCERAL ANGIOGRAPHY N/A 09/13/2017   Procedure: VISCERAL ANGIOGRAPHY;  Surgeon: Jama Cordella MATSU, MD;  Location: [facility] INVASIVE CV LAB;  Service: Cardiovascular;  Laterality: N/A;   Family History  Problem Relation Age of Onset   Breast cancer Daughter    Colon cancer Maternal Grandfather    Hypertension Father    Dementia Father    Arthritis Mother     Osteoporosis Mother    Kidney disease Neg Hx    Bladder Cancer Neg Hx    Social History   Socioeconomic History   Marital status: Divorced    Spouse name: Not on file   Number of children: Not on file   Years of education: Not on file   Highest education level: Not on file  Occupational History   Not on file  Tobacco Use   Smoking status: Former   Smokeless tobacco: Former   Tobacco comments:    quit at age 71  Vaping Use   Vaping status: Never Used  Substance and Sexual Activity   Alcohol use: No    Alcohol/week: 0.0 standard drinks of alcohol    Comment: occasional   Drug use: No   Sexual activity: Not Currently  Other Topics Concern   Not on file  Social History Narrative   ** Merged History Encounter **       Social Drivers of Health   Financial Resource Strain: Low Risk  (04/03/2017)   Overall Financial Resource Strain (CARDIA)    Difficulty of Paying Living Expenses: Not hard at all  Food Insecurity: No Food Insecurity (04/03/2017)   Hunger Vital Sign    Worried About Running Out of Food in the Last Year: Never true    Ran Out of Food in the Last Year: Never true  Transportation Needs: No Transportation Needs (04/03/2017)   PRAPARE - Administrator, Civil Service (Medical): No    Lack of Transportation (Non-Medical): No  Physical Activity: Unknown (04/03/2017)   Exercise Vital Sign    Days of Exercise per Week: Patient declined    Minutes of Exercise per Session: Patient declined  Stress: No Stress Concern Present (04/03/2017)   [employer] of Occupational Health - Occupational Stress Questionnaire    Feeling of Stress : Not at all  Social Connections: Unknown (04/03/2017)   Social Connection and Isolation Panel    Frequency of Communication with Friends and Family: Patient declined    Frequency of Social Gatherings with Friends and Family: Patient declined    Attends Religious Services: Patient declined    Database administrator or  Organizations: Patient declined    Attends Engineer, structural: Patient declined    Marital Status: Patient declined    Tobacco Counseling Counseling given: Not Answered Tobacco comments: quit at age 62   Clinical Intake:  Pre-visit preparation completed: Yes  Pain : No/denies pain     BMI - recorded: 29 Nutritional Status: BMI 25 -29 Overweight Nutritional Risks: Other (Comment) (dementia)  How often do you need to have someone help you when you read instructions, pamphlets, or other  written materials from your doctor or pharmacy?: 5 - Always         Activities of Daily Living    11/23/2023    2:50 PM  In your present state of health, do you have any difficulty performing the following activities:  Hearing? 0  Vision? 0  Difficulty concentrating or making decisions? 1  Walking or climbing stairs? 1  Dressing or bathing? 1  Doing errands, shopping? 1  Preparing Food and eating ? N  Using the Toilet? N  In the past six months, have you accidently leaked urine? Y  Do you have problems with loss of bowel control? N  Managing your Medications? Y  Managing your Finances? Y  Housekeeping or managing your Housekeeping? Y    Patient Care Team: [name] Fine, MD as PCP - General (Family Medicine)  Indicate any recent Medical Services you may have received from other than [facility] providers in the past year (date may be approximate).     Assessment:   This is a routine wellness examination for Malala.  Hearing/Vision screen No results found.   Goals Addressed   None    Depression Screen    06/10/2022    2:55 PM  PHQ 2/9 Scores  PHQ - 2 Score 0    Fall Risk    11/23/2023    2:53 PM 11/10/2022   11:48 AM 06/10/2022    2:55 PM  Fall Risk   Falls in the past year? 1 1 0  Number falls in past yr: 1 1 0  Injury with Fall? 1 1 0  Risk for fall due to : History of fall(s);Impaired balance/gait;Impaired mobility History of fall(s);Impaired  balance/gait;Impaired mobility No Fall Risks  Follow up Falls evaluation completed Falls evaluation completed;Education provided Falls evaluation completed    MEDICARE RISK AT HOME: Medicare Risk at Home Any stairs in or around the home?: No Home free of loose throw rugs in walkways, pet beds, electrical cords, etc?: Yes Adequate lighting in your home to reduce risk of falls?: Yes Life alert?: No Use of a cane, walker or w/c?: Yes Grab bars in the bathroom?: Yes Shower chair or bench in shower?: Yes Elevated toilet seat or a handicapped toilet?: Yes  TIMED UP AND GO:  Was the test performed?  No    Cognitive Function:        Immunizations Immunization History  Administered Date(s) Administered   Influenza, High Dose Seasonal PF 04/05/2017   Influenza-Unspecified 02/04/2020, 02/01/2021, 02/01/2022, 02/08/2023   MODERNA COVID-19 SARS-COV-2 PEDS BIVALENT BOOSTER 30yr-61yr 04/29/2019, 05/27/2019, 02/25/2022   Moderna Covid-19 Fall Seasonal Vaccine 82yrs & older 07/26/2022   Moderna SARS-COV2 Booster Vaccination 09/04/2020   Pfizer Covid-19 Vaccine Bivalent Booster 84yrs & up 02/28/2020, 01/08/2021   Pneumococcal Conjugate-13 03/07/2018   Pneumococcal Polysaccharide-23 03/11/2019   Tdap 09/14/2010   Unspecified SARS-COV-2 Vaccination 07/28/2023    TDAP status: Due, Education has been provided regarding the importance of this vaccine. Advised may receive this vaccine at local pharmacy or Health Dept. Aware to provide a copy of the vaccination record if obtained from local pharmacy or Health Dept. Verbalized acceptance and understanding.  Flu Vaccine status: Due, Education has been provided regarding the importance of this vaccine. Advised may receive this vaccine at local pharmacy or Health Dept. Aware to provide a copy of the vaccination record if obtained from local pharmacy or Health Dept. Verbalized acceptance and understanding.  Pneumococcal vaccine status: Up to  date  Covid-19 vaccine status: Information provided on  how to obtain vaccines.   Qualifies for Shingles Vaccine? Yes   Zostavax completed No   Shingrix Completed?: No.    Education has been provided regarding the importance of this vaccine. Patient has been advised to call insurance company to determine out of pocket expense if they have not yet received this vaccine. Advised may also receive vaccine at local pharmacy or Health Dept. Verbalized acceptance and understanding.   TDAP and shingles vaccine ordered to be given at facility    Screening Tests Health Maintenance  Topic Date Due   Zoster Vaccines- Shingrix (1 of 2) Never done   INFLUENZA VACCINE  11/17/2023   DTaP/Tdap/Td (2 - Td or Tdap) 07/04/2024 (Originally 09/13/2020)   COVID-19 Vaccine (9 - 2024-25 season) 01/27/2024   Medicare Annual Wellness (AWV)  11/22/2024   Pneumococcal Vaccine: 50+ Years  Completed   Hepatitis B Vaccines  Aged Out   HPV VACCINES  Aged Out   Meningococcal B Vaccine  Aged Out   DEXA SCAN  Discontinued    Health Maintenance  Health Maintenance Due  Topic Date Due   Zoster Vaccines- Shingrix (1 of 2) Never done   INFLUENZA VACCINE  11/17/2023    Colorectal cancer screening: No longer required.   Mammogram status: No longer required due to age.   Lung Cancer Screening: (Low Dose CT Chest recommended if Age 51-80 years, 20 pack-year currently smoking OR have quit w/in 15years.) does not qualify.   Additional Screening:  Hepatitis C Screening: does not qualify  Vision Screening: Recommended annual ophthalmology exams for early detection of glaucoma and other disorders of the eye. Is the patient up to date with their annual eye exam?  No  Who is the provider or what is the name of the office in which the patient attends annual eye exams? none If pt is not established with a provider, would they like to be referred to a provider to establish care? Yes .  [facility] ordered   Dental  Screening: Recommended annual dental exams for proper oral hygiene   Community Resource Referral / Chronic Care Management: CRR required this visit?  No   CCM required this visit?  No     Plan:     I have personally reviewed and noted the following in the patient's chart:   Medical and social history Use of alcohol, tobacco or illicit drugs  Current medications and supplements including opioid prescriptions. Patient is not currently taking opioid prescriptions. Functional ability and status Nutritional status Physical activity Advanced directives List of other physicians Hospitalizations, surgeries, and ER visits in previous 12 months Vitals Screenings to include cognitive, depression, and falls Referrals and appointments  In addition, I have reviewed and discussed with patient certain preventive protocols, quality metrics, and best practice recommendations. A written personalized care plan for preventive services as well as general preventive health recommendations were provided to patient.     Harlene MARLA An, NP   11/23/2023

## 2023-12-28 ENCOUNTER — Non-Acute Institutional Stay (SKILLED_NURSING_FACILITY): Payer: Self-pay | Admitting: Nurse Practitioner

## 2023-12-28 ENCOUNTER — Encounter: Payer: Self-pay | Admitting: Nurse Practitioner

## 2023-12-28 DIAGNOSIS — E782 Mixed hyperlipidemia: Secondary | ICD-10-CM

## 2023-12-28 DIAGNOSIS — I6930 Unspecified sequelae of cerebral infarction: Secondary | ICD-10-CM

## 2023-12-28 DIAGNOSIS — E039 Hypothyroidism, unspecified: Secondary | ICD-10-CM | POA: Diagnosis not present

## 2023-12-28 DIAGNOSIS — I10 Essential (primary) hypertension: Secondary | ICD-10-CM | POA: Diagnosis not present

## 2023-12-28 DIAGNOSIS — N3941 Urge incontinence: Secondary | ICD-10-CM

## 2023-12-28 DIAGNOSIS — F01B0 Vascular dementia, moderate, without behavioral disturbance, psychotic disturbance, mood disturbance, and anxiety: Secondary | ICD-10-CM | POA: Diagnosis not present

## 2023-12-28 DIAGNOSIS — M810 Age-related osteoporosis without current pathological fracture: Secondary | ICD-10-CM

## 2023-12-28 DIAGNOSIS — M150 Primary generalized (osteo)arthritis: Secondary | ICD-10-CM | POA: Diagnosis not present

## 2023-12-28 DIAGNOSIS — F39 Unspecified mood [affective] disorder: Secondary | ICD-10-CM | POA: Diagnosis not present

## 2023-12-28 NOTE — Progress Notes (Signed)
 Location:  Other [facility].  Nursing Home Room Number: [facility] [id number] Place of Service:  SNF [phone]) Harlene An, NP  PCP: [name] Fine, MD  Patient Care Team: [name] Fine, MD as PCP - General [facility] Medicine)  Extended Emergency Contact Information Primary Emergency Contact: [name] Address: [address]          [place], [place] [address] [place] Home Phone: [phone] Relation: Daughter Secondary Emergency Contact: [name]  [place] Home Phone: [phone] Mobile Phone: [phone] Relation: Son  Goals of care: Advanced Directive information    11/23/2023    3:12 PM  Advanced Directives  Does Patient Have a Medical Advance Directive? Yes  Type of Advance Directive Out of facility DNR (pink MOST or yellow form)  Does patient want to make changes to medical advance directive? No - Patient declined     Chief Complaint  Patient presents with   Medical Management of Chronic Issues    Medical Management of Chronic Issues.     HPI:  Pt is a 89 y.o. female seen today for medical management of chronic disease. Pt with hx of dementia, OAB, hyperlipidemia, GERD, hx of CVA with aphagia, dementia, depression, hypothyroid.  Staff noted that she had bit her lip overnight. No swelling or bleeding noted today.  Pt denies pain.  She has been eating well.  No recent falls.  No worsening anxiety or depression reported.    Past Medical History:  Diagnosis Date   Cervicalgia    Depression    Essential hypertension    Hemorrhoids    History of MRSA infection    Hyperlipidemia    Hypertension    Hypothyroidism    IBS (irritable bowel syndrome)    Osteoarthritis of multiple joints    Restless leg    Senile osteoporosis    Shingles    Sleep apnea    Ulcerative colitis (HCC)    Urge incontinence    Past Surgical History:  Procedure Laterality Date   ABDOMINAL HYSTERECTOMY  1980   cataract surgery      CHOLECYSTECTOMY  1955   colon polyp removal  12/2012   ESOPHAGOGASTRODUODENOSCOPY  2014   makoplasty Left 06/2012   parotid gland removal  1984   VISCERAL ANGIOGRAPHY N/A 09/13/2017   Procedure: VISCERAL ANGIOGRAPHY;  Surgeon: Jama Cordella MATSU, MD;  Location: [facility] INVASIVE CV LAB;  Service: Cardiovascular;  Laterality: N/A;    Allergies  Allergen Reactions   Codeine Sulfate Hives and Itching    Can take tramadol   Codeine Other (See Comments)    Outpatient Encounter Medications as of 12/28/2023  Medication Sig   acetaminophen  (TYLENOL ) 500 MG tablet Take 1,000 mg by mouth 2 (two) times daily.   alum & mag hydroxide-simeth (MAALOX/MYLANTA) 200-200-20 MG/5ML suspension Take 30 mLs by mouth every 4 (four) hours as needed for indigestion or heartburn.   atorvastatin  (LIPITOR) 40 MG tablet Take 1 tablet (40 mg total) by mouth daily at 6 PM.   Calcium  Carbonate-Vit D-Min (CALCIUM  600+D PLUS MINERALS) 600-400 MG-UNIT TABS Take 1 tablet by mouth daily.   Cholecalciferol (D3-50) 50000 units capsule Take 50,000 Units by mouth every 30 (thirty) days.   cyanocobalamin  1000 MCG tablet Take 1,000 mcg by mouth daily.   DULoxetine  (CYMBALTA ) 20 MG capsule Take 20 mg by mouth daily.   levothyroxine  (SYNTHROID , LEVOTHROID) 50 MCG tablet Take 50 mcg by mouth daily before breakfast.   losartan  (COZAAR ) 50 MG tablet Take 50 mg by  mouth daily.    mirtazapine (REMERON) 7.5 MG tablet Take 7.5 mg by mouth at bedtime.   Multiple Vitamins-Minerals (MULTIVITAMIN ADULT PO) Take 1 tablet by mouth daily.    No facility-administered encounter medications on file as of 12/28/2023.    Review of Systems  Unable to perform ROS: Dementia     Immunization History  Administered Date(s) Administered   INFLUENZA, HIGH DOSE SEASONAL PF 04/05/2017   Influenza-Unspecified 02/04/2020, 02/01/2021, 02/01/2022, 02/08/2023   MODERNA COVID-19 SARS-COV-2 PEDS BIVALENT BOOSTER 65yr-37yr 04/29/2019, 05/27/2019, 02/25/2022    Moderna Covid-19 Fall Seasonal Vaccine 86yrs & older 07/26/2022   Moderna SARS-COV2 Booster Vaccination 09/04/2020   Pfizer Covid-19 Vaccine Bivalent Booster 98yrs & up 02/28/2020, 01/08/2021   Pneumococcal Conjugate-13 03/07/2018   Pneumococcal Polysaccharide-23 03/11/2019   Tdap 09/14/2010   Unspecified SARS-COV-2 Vaccination 07/28/2023   Pertinent  Health Maintenance Due  Topic Date Due   Influenza Vaccine  11/17/2023   DEXA SCAN  Discontinued      06/10/2022    2:55 PM 11/10/2022   11:48 AM 11/23/2023    2:53 PM  Fall Risk  Falls in the past year? 0 1 1  Was there an injury with Fall? 0 1 1  Fall Risk Category Calculator 0 3 3  Patient at Risk for Falls Due to No Fall Risks History of fall(s);Impaired balance/gait;Impaired mobility History of fall(s);Impaired balance/gait;Impaired mobility  Fall risk Follow up Falls evaluation completed Falls evaluation completed;Education provided Falls evaluation completed   Functional Status Survey:    Vitals:   12/28/23 0921 12/28/23 0944  BP: (!) 143/82 128/81  Pulse: 78   Resp: 16   Temp: 98 F (36.7 C)   SpO2: 95%   Weight: 151 lb 12.8 oz (68.9 kg)   Height: 5' (1.524 m)    Body mass index is 29.65 kg/m. Wt Readings from Last 3 Encounters:  12/28/23 151 lb 12.8 oz (68.9 kg)  11/23/23 149 lb (67.6 kg)  11/15/23 149 lb (67.6 kg)    Physical Exam Constitutional:      General: She is not in acute distress.    Appearance: She is well-developed. She is not diaphoretic.  HENT:     Head: Normocephalic and atraumatic.     Mouth/Throat:     Pharynx: No oropharyngeal exudate.  Eyes:     Conjunctiva/sclera: Conjunctivae normal.     Pupils: Pupils are equal, round, and reactive to light.  Cardiovascular:     Rate and Rhythm: Normal rate and regular rhythm.     Heart sounds: Normal heart sounds.  Pulmonary:     Effort: Pulmonary effort is normal.     Breath sounds: Normal breath sounds.  Abdominal:     General: Bowel sounds  are normal.     Palpations: Abdomen is soft.  Musculoskeletal:     Cervical back: Normal range of motion and neck supple.     Right lower leg: No edema.     Left lower leg: No edema.  Skin:    General: Skin is warm and dry.  Neurological:     Mental Status: She is alert.     Motor: Weakness present.     Gait: Gait abnormal.  Psychiatric:        Mood and Affect: Mood normal.     Labs reviewed: Recent Labs    02/20/23 0000 04/10/23 0000 09/21/23 0000  NA 138 137 136*  K 4.4 4.0 4.1  CL 105 6* 103  CO2 29* 27* 26*  BUN  24* 19 17  CREATININE 1.0 1.0 0.9  CALCIUM  9.0 8.9 9.3   Recent Labs    02/20/23 0000  AST 14  ALT 11  ALKPHOS 45  ALBUMIN 3.6   Recent Labs    02/20/23 0000 04/10/23 0000 05/11/23 0000 09/21/23 0000  WBC 6.1 7.0 5.3 8.0  NEUTROABS 3,739.00  --  2,846.00 5,608.00  HGB 11.9* 12.9 12.1 12.4  HCT 38 41 38 38  PLT 164 88* 106* 156   Lab Results  Component Value Date   TSH 1.40 02/20/2023   Lab Results  Component Value Date   HGBA1C 5.8 (H) 04/04/2017   Lab Results  Component Value Date   CHOL 130 02/20/2023   HDL 43 02/20/2023   LDLCALC 63 02/20/2023   TRIG 165 (A) 02/20/2023   CHOLHDL 6.3 04/04/2017    Significant Diagnostic Results in last 30 days:  No results found.  Assessment/Plan Adult hypothyroidism Continues on synthroid , tsh at goal on labs from nov 2024, check annually.   Combined hyperlipidemia Continues on lipitor 40 mg daily.  Follow up lipids annually in November.   History of stroke with residual deficit Stable, continues on lipitor. Continues with support from staff and family.   Moderate vascular dementia without behavioral disturbance, psychotic disturbance, mood disturbance, or anxiety (HCC) Stable, no acute changes in cognitive or functional status, continue supportive care.    Mood disorder (HCC) Controlled on remeron and cymbalta .   Osteoarthritis of multiple joints Stable on schedule tylenol . No  signs of worsening pain  Primary hypertension Blood pressure well controlled, goal bp <140/90 Continue current medications and dietary modifications follow metabolic panel  Urge incontinence Continue supportive care.   Senile osteoporosis Continue on cal and vit d supplement     Kaitlyn Ramos K. Caro BODILY [facility] [phone]

## 2024-01-02 DIAGNOSIS — R4701 Aphasia: Secondary | ICD-10-CM | POA: Insufficient documentation

## 2024-01-02 NOTE — Assessment & Plan Note (Signed)
 Continues on lipitor 40 mg daily.  Follow up lipids annually in November.

## 2024-01-02 NOTE — Assessment & Plan Note (Signed)
 Blood pressure well controlled, goal bp <140/90 Continue current medications and dietary modifications follow metabolic panel

## 2024-01-02 NOTE — Assessment & Plan Note (Signed)
 Stable on schedule tylenol . No signs of worsening pain

## 2024-01-02 NOTE — Assessment & Plan Note (Signed)
 Stable, continues on lipitor. Continues with support from staff and family.

## 2024-01-02 NOTE — Assessment & Plan Note (Signed)
 Continue on cal and vit d supplement

## 2024-01-02 NOTE — Assessment & Plan Note (Signed)
 Controlled on remeron and cymbalta 

## 2024-01-02 NOTE — Assessment & Plan Note (Signed)
 Stable, no acute changes in cognitive or functional status, continue supportive care.

## 2024-01-02 NOTE — Assessment & Plan Note (Signed)
 Continue supportive care

## 2024-01-02 NOTE — Assessment & Plan Note (Signed)
 Continues on synthroid , tsh at goal on labs from nov 2024, check annually.

## 2024-02-19 LAB — VITAMIN D 25 HYDROXY (VIT D DEFICIENCY, FRACTURES): Vit D, 25-Hydroxy: 35

## 2024-02-19 LAB — BASIC METABOLIC PANEL WITH GFR
BUN: 16 (ref 4–21)
CO2: 29 — AB (ref 13–22)
Chloride: 102 (ref 99–108)
Creatinine: 0.9 (ref 0.5–1.1)
Glucose: 161
Potassium: 4.2 meq/L (ref 3.5–5.1)
Sodium: 138 (ref 137–147)

## 2024-02-19 LAB — CBC AND DIFFERENTIAL
HCT: 40 (ref 36–46)
Hemoglobin: 12.8 (ref 12.0–16.0)
Neutrophils Absolute: 4080
Platelets: 147 K/uL — AB (ref 150–400)
WBC: 6

## 2024-02-19 LAB — TSH: TSH: 1.49 (ref 0.41–5.90)

## 2024-02-19 LAB — CBC: RBC: 4.39 (ref 3.87–5.11)

## 2024-02-19 LAB — HEPATIC FUNCTION PANEL
ALT: 7 U/L (ref 7–35)
AST: 14 (ref 13–35)
Alkaline Phosphatase: 47 (ref 25–125)
Bilirubin, Total: 0.4

## 2024-02-19 LAB — LIPID PANEL
Cholesterol: 122 (ref 0–200)
HDL: 39 (ref 35–70)
LDL Cholesterol: 55
Triglycerides: 224 — AB (ref 40–160)

## 2024-02-19 LAB — COMPREHENSIVE METABOLIC PANEL WITH GFR
Albumin: 3.8 (ref 3.5–5.0)
Calcium: 9 (ref 8.7–10.7)
Globulin: 2.6
eGFR: 58

## 2024-02-22 ENCOUNTER — Encounter: Payer: Self-pay | Admitting: Nurse Practitioner

## 2024-02-22 ENCOUNTER — Non-Acute Institutional Stay (SKILLED_NURSING_FACILITY): Payer: Self-pay | Admitting: Nurse Practitioner

## 2024-02-22 DIAGNOSIS — E039 Hypothyroidism, unspecified: Secondary | ICD-10-CM

## 2024-02-22 DIAGNOSIS — F01B0 Vascular dementia, moderate, without behavioral disturbance, psychotic disturbance, mood disturbance, and anxiety: Secondary | ICD-10-CM | POA: Diagnosis not present

## 2024-02-22 DIAGNOSIS — M150 Primary generalized (osteo)arthritis: Secondary | ICD-10-CM | POA: Diagnosis not present

## 2024-02-22 DIAGNOSIS — I6930 Unspecified sequelae of cerebral infarction: Secondary | ICD-10-CM

## 2024-02-22 DIAGNOSIS — F39 Unspecified mood [affective] disorder: Secondary | ICD-10-CM | POA: Diagnosis not present

## 2024-02-22 DIAGNOSIS — I10 Essential (primary) hypertension: Secondary | ICD-10-CM | POA: Diagnosis not present

## 2024-02-22 DIAGNOSIS — E782 Mixed hyperlipidemia: Secondary | ICD-10-CM

## 2024-02-22 NOTE — Progress Notes (Signed)
 Location:  Other [facility].  Nursing Home Room Number: [facility] [id number] Place of Service:  SNF [phone]) Harlene An, NP  PCP: [name], DO  Patient Care Team: [name], DO as PCP - General (Internal Medicine)  Extended Emergency Contact Information Primary Emergency Contact: [name] Address: [address]          [place], [place] [address] [place] Home Phone: [phone] Relation: Daughter Secondary Emergency Contact: [name]  [place] Home Phone: [phone] Mobile Phone: [phone] Relation: Son  Goals of care: Advanced Directive information    02/22/2024   11:39 AM  Advanced Directives  Does Patient Have a Medical Advance Directive? Yes  Type of Advance Directive Out of facility DNR (pink MOST or yellow form)  Does patient want to make changes to medical advance directive? No - Patient declined     Chief Complaint  Patient presents with   Medical Management of Chronic Issues    Medical Management of Chronic Issues.     HPI:  Pt is a 89 y.o. female seen today for medical management of chronic disease. Pt with hx of dementia, HTN, hypothyroid, depression.  She is resting in her room at the time of visit.  She denies any pain.  Walks with restorative with walker and gait belt.  Denies any anxiety or depression Overall nursing reports she eats well- she has lost 3 lbs in the last month.  Denies shortness of breath, chest pains, constipation or diarrhea No LE edema.   Past Medical History:  Diagnosis Date   Cervicalgia    Depression    Essential hypertension    Hemorrhoids    History of MRSA infection    Hyperlipidemia    Hypertension    Hypothyroidism    IBS (irritable bowel syndrome)    Osteoarthritis of multiple joints    Restless leg    Senile osteoporosis    Shingles    Sleep apnea    Ulcerative colitis (HCC)    Urge incontinence    Past Surgical History:  Procedure Laterality  Date   ABDOMINAL HYSTERECTOMY  1980   cataract surgery     CHOLECYSTECTOMY  1955   colon polyp removal  12/2012   ESOPHAGOGASTRODUODENOSCOPY  2014   makoplasty Left 06/2012   parotid gland removal  1984   VISCERAL ANGIOGRAPHY N/A 09/13/2017   Procedure: VISCERAL ANGIOGRAPHY;  Surgeon: Jama Cordella MATSU, MD;  Location: [facility] INVASIVE CV LAB;  Service: Cardiovascular;  Laterality: N/A;    Allergies  Allergen Reactions   Codeine Sulfate Hives and Itching    Can take tramadol   Codeine Other (See Comments)    Outpatient Encounter Medications as of 02/22/2024  Medication Sig   acetaminophen  (TYLENOL ) 500 MG tablet Take 1,000 mg by mouth 2 (two) times daily.   alum & mag hydroxide-simeth (MAALOX/MYLANTA) 200-200-20 MG/5ML suspension Take 30 mLs by mouth every 4 (four) hours as needed for indigestion or heartburn.   atorvastatin  (LIPITOR) 40 MG tablet Take 1 tablet (40 mg total) by mouth daily at 6 PM.   Calcium  Carbonate-Vit D-Min (CALCIUM  600+D PLUS MINERALS) 600-400 MG-UNIT TABS Take 1 tablet by mouth daily.   Cholecalciferol (D3-50) 50000 units capsule Take 50,000 Units by mouth every 30 (thirty) days.   cyanocobalamin  1000 MCG tablet Take 1,000 mcg by mouth daily.   DULoxetine  (CYMBALTA ) 20 MG capsule Take 20 mg by mouth daily.   levothyroxine  (SYNTHROID , LEVOTHROID) 50 MCG tablet Take 50 mcg by mouth daily  before breakfast.   losartan  (COZAAR ) 50 MG tablet Take 50 mg by mouth daily.    mirtazapine (REMERON) 7.5 MG tablet Take 7.5 mg by mouth at bedtime.   Multiple Vitamins-Minerals (MULTIVITAMIN ADULT PO) Take 1 tablet by mouth daily.    No facility-administered encounter medications on file as of 02/22/2024.    Review of Systems  Constitutional:  Negative for activity change, appetite change, fatigue and unexpected weight change.  HENT:  Negative for congestion and hearing loss.   Eyes: Negative.   Respiratory:  Negative for cough and shortness of breath.   Cardiovascular:   Negative for chest pain, palpitations and leg swelling.  Gastrointestinal:  Negative for abdominal pain, constipation and diarrhea.  Genitourinary:  Negative for difficulty urinating and dysuria.  Musculoskeletal:  Negative for arthralgias and myalgias.  Skin:  Negative for color change and wound.  Neurological:  Negative for dizziness and weakness.  Psychiatric/Behavioral:  Positive for confusion. Negative for agitation and behavioral problems.      Immunization History  Administered Date(s) Administered   INFLUENZA, HIGH DOSE SEASONAL PF 04/05/2017   Influenza-Unspecified 02/04/2020, 02/01/2021, 02/01/2022, 02/08/2023, 01/30/2024   MODERNA COVID-19 SARS-COV-2 PEDS BIVALENT BOOSTER 53yr-36yr 04/29/2019, 05/27/2019, 02/25/2022   Moderna Covid-19 Fall Seasonal Vaccine 3yrs & older 07/26/2022   Moderna SARS-COV2 Booster Vaccination 09/04/2020   Pfizer Covid-19 Vaccine Bivalent Booster 39yrs & up 02/28/2020, 01/08/2021   Pneumococcal Conjugate-13 03/07/2018   Pneumococcal Polysaccharide-23 03/11/2019   Tdap 09/14/2010, 01/02/2024   Unspecified SARS-COV-2 Vaccination 07/28/2023   Pertinent  Health Maintenance Due  Topic Date Due   Influenza Vaccine  Completed   DEXA SCAN  Discontinued      06/10/2022    2:55 PM 11/10/2022   11:48 AM 11/23/2023    2:53 PM  Fall Risk  Falls in the past year? 0 1 1  Was there an injury with Fall? 0 1 1  Fall Risk Category Calculator 0 3 3  Patient at Risk for Falls Due to No Fall Risks History of fall(s);Impaired balance/gait;Impaired mobility History of fall(s);Impaired balance/gait;Impaired mobility  Fall risk Follow up Falls evaluation completed Falls evaluation completed;Education provided Falls evaluation completed   Functional Status Survey:    Vitals:   02/22/24 1134  BP: 130/83  Pulse: 80  Resp: 16  Temp: 97.6 F (36.4 C)  SpO2: 97%  Weight: 147 lb 12.8 oz (67 kg)  Height: 5' (1.524 m)   Body mass index is 28.87 kg/m. Physical  Exam Constitutional:      General: She is not in acute distress.    Appearance: She is well-developed. She is not diaphoretic.  HENT:     Head: Normocephalic and atraumatic.     Mouth/Throat:     Pharynx: No oropharyngeal exudate.  Eyes:     Conjunctiva/sclera: Conjunctivae normal.     Pupils: Pupils are equal, round, and reactive to light.  Cardiovascular:     Rate and Rhythm: Normal rate and regular rhythm.     Heart sounds: Normal heart sounds.  Pulmonary:     Effort: Pulmonary effort is normal.     Breath sounds: Normal breath sounds.  Abdominal:     General: Bowel sounds are normal.     Palpations: Abdomen is soft.  Musculoskeletal:        General: No tenderness.     Cervical back: Normal range of motion and neck supple.  Skin:    General: Skin is warm and dry.  Neurological:     Mental Status: She  is alert and oriented to person, place, and time.     Labs reviewed: Recent Labs    04/10/23 0000 09/21/23 0000  NA 137 136*  K 4.0 4.1  CL 6* 103  CO2 27* 26*  BUN 19 17  CREATININE 1.0 0.9  CALCIUM  8.9 9.3   No results for input(s): AST, ALT, ALKPHOS, BILITOT, PROT, ALBUMIN in the last 8760 hours. Recent Labs    04/10/23 0000 05/11/23 0000 09/21/23 0000  WBC 7.0 5.3 8.0  NEUTROABS  --  2,846.00 5,608.00  HGB 12.9 12.1 12.4  HCT 41 38 38  PLT 88* 106* 156   Lab Results  Component Value Date   TSH 1.40 02/20/2023   Lab Results  Component Value Date   HGBA1C 5.8 (H) 04/04/2017   Lab Results  Component Value Date   CHOL 130 02/20/2023   HDL 43 02/20/2023   LDLCALC 63 02/20/2023   TRIG 165 (A) 02/20/2023   CHOLHDL 6.3 04/04/2017    Significant Diagnostic Results in last 30 days:  No results found.  Assessment/Plan Primary hypertension Blood pressure well controlled on losartan .  Continue current medications and dietary modifications follow metabolic panel.   Osteoarthritis of multiple joints Stable on schedule tylenol . No signs  of worsening pain.   Mood disorder Controlled on remeron and cymbalta . Denies anxiety or depression. No signs of anxiety or depression Will titrate Cymbalta  at this time.   Moderate vascular dementia without behavioral disturbance, psychotic disturbance, mood disturbance, or anxiety (HCC) Stable, no acute changes in cognitive or functional status, continue supportive care.    History of stroke with residual deficit Stable, continues on lipitor. Continues with support from staff and family.   Combined hyperlipidemia Continues on lipitor 40 mg daily.  Follow up lipids annually on next labs  Adult hypothyroidism Continues on synthroid , follow up TSH     Caisen Mangas K. Caro BODILY [facility] [phone]

## 2024-02-22 NOTE — Assessment & Plan Note (Signed)
 Stable, continues on lipitor. Continues with support from staff and family.

## 2024-02-22 NOTE — Assessment & Plan Note (Signed)
 Stable, no acute changes in cognitive or functional status, continue supportive care.

## 2024-02-22 NOTE — Assessment & Plan Note (Signed)
 Controlled on remeron and cymbalta . Denies anxiety or depression. No signs of anxiety or depression Will titrate Cymbalta  at this time.

## 2024-02-22 NOTE — Assessment & Plan Note (Signed)
 Continues on synthroid , follow up TSH

## 2024-02-22 NOTE — Assessment & Plan Note (Signed)
 Continues on lipitor 40 mg daily.  Follow up lipids annually on next labs

## 2024-02-22 NOTE — Assessment & Plan Note (Signed)
 Blood pressure well controlled on losartan .  Continue current medications and dietary modifications follow metabolic panel.

## 2024-02-22 NOTE — Assessment & Plan Note (Signed)
 Stable on schedule tylenol . No signs of worsening pain

## 2024-03-18 ENCOUNTER — Non-Acute Institutional Stay (SKILLED_NURSING_FACILITY): Payer: Self-pay | Admitting: Nurse Practitioner

## 2024-03-18 DIAGNOSIS — I10 Essential (primary) hypertension: Secondary | ICD-10-CM

## 2024-03-18 DIAGNOSIS — I6930 Unspecified sequelae of cerebral infarction: Secondary | ICD-10-CM

## 2024-03-18 DIAGNOSIS — F01B0 Vascular dementia, moderate, without behavioral disturbance, psychotic disturbance, mood disturbance, and anxiety: Secondary | ICD-10-CM | POA: Diagnosis not present

## 2024-03-18 DIAGNOSIS — F39 Unspecified mood [affective] disorder: Secondary | ICD-10-CM | POA: Diagnosis not present

## 2024-03-18 DIAGNOSIS — R011 Cardiac murmur, unspecified: Secondary | ICD-10-CM | POA: Insufficient documentation

## 2024-03-18 DIAGNOSIS — E039 Hypothyroidism, unspecified: Secondary | ICD-10-CM | POA: Diagnosis not present

## 2024-03-18 DIAGNOSIS — E559 Vitamin D deficiency, unspecified: Secondary | ICD-10-CM | POA: Diagnosis not present

## 2024-03-18 DIAGNOSIS — R634 Abnormal weight loss: Secondary | ICD-10-CM | POA: Insufficient documentation

## 2024-03-18 NOTE — Assessment & Plan Note (Signed)
 Noted to have progressive weight loss Will increase remeron to 15 mg by mouth daily at bedtime to help with appetite and mood

## 2024-03-18 NOTE — Assessment & Plan Note (Signed)
 Vit D level in normal range on current regimen.

## 2024-03-18 NOTE — Progress Notes (Signed)
 Location:      Place of Service:     Kaitlyn Locus, DO  Patient Care Team: Kaitlyn Locus, DO as PCP - General (Internal Medicine)  Extended Emergency Contact Information Primary Emergency Contact: Kaitlyn Ramos Address: [address]          [place], [place] [address] [place] Home Phone: [phone] Relation: Daughter Secondary Emergency Contact: Kaitlyn Ramos  [place] Home Phone: [phone] Mobile Phone: [phone] Relation: Son  Goals of care: Advanced Directive information    02/22/2024   11:39 AM  Advanced Directives  Does Patient Have a Medical Advance Directive? Yes  Type of Advance Directive Out of facility DNR (pink MOST or yellow form)  Does patient want to make changes to medical advance directive? No - Patient declined     Chief Complaint  Patient presents with   Medical Management of Chronic Issues    HPI:  Pt is a 89 y.o. female seen today for medical management of chronic disease.  Staff and family also concerned about her increase in fatigue and overall decline noted over the last few weeks.  Staff notes some decrease in oral intake and weight loss was noted.  Daughter questions if she is having some seasonal depression symptoms.  Pt denies anxiety or depression at this time Denies shortness of breath, no LE edema noted No chest pains.  No abdominal pain, nausea, vomiting, constipation or diarrhea reported   Past Medical History:  Diagnosis Date   Cervicalgia    Depression    Essential hypertension    Hemorrhoids    History of MRSA infection    Hyperlipidemia    Hypertension    Hypothyroidism    IBS (irritable bowel syndrome)    Osteoarthritis of multiple joints    Restless leg    Senile osteoporosis    Shingles    Sleep apnea    Ulcerative colitis (HCC)    Urge incontinence    Past Surgical History:  Procedure Laterality Date   ABDOMINAL HYSTERECTOMY  1980   cataract surgery      CHOLECYSTECTOMY  1955   colon polyp removal  12/2012   ESOPHAGOGASTRODUODENOSCOPY  2014   makoplasty Left 06/2012   parotid gland removal  1984   VISCERAL ANGIOGRAPHY N/A 09/13/2017   Procedure: VISCERAL ANGIOGRAPHY;  Surgeon: Kaitlyn Cordella MATSU, MD;  Location: [facility] INVASIVE CV LAB;  Service: Cardiovascular;  Laterality: N/A;    Allergies  Allergen Reactions   Codeine Sulfate Hives and Itching    Can take tramadol   Codeine Other (See Comments)    Outpatient Encounter Medications as of 03/18/2024  Medication Sig   acetaminophen  (TYLENOL ) 500 MG tablet Take 1,000 mg by mouth 2 (two) times daily.   alum & mag hydroxide-simeth (MAALOX/MYLANTA) 200-200-20 MG/5ML suspension Take 30 mLs by mouth every 4 (four) hours as needed for indigestion or heartburn.   atorvastatin  (LIPITOR) 40 MG tablet Take 1 tablet (40 mg total) by mouth daily at 6 PM.   Calcium  Carbonate-Vit D-Min (CALCIUM  600+D PLUS MINERALS) 600-400 MG-UNIT TABS Take 1 tablet by mouth daily.   Cholecalciferol (D3-50) 50000 units capsule Take 50,000 Units by mouth every 30 (thirty) days.   cyanocobalamin  1000 MCG tablet Take 1,000 mcg by mouth daily.   levothyroxine  (SYNTHROID , LEVOTHROID) 50 MCG tablet Take 50 mcg by mouth daily before breakfast.   losartan  (COZAAR ) 50 MG tablet Take 50 mg by mouth daily.    mirtazapine (REMERON) 7.5 MG tablet Take 7.5 mg by  mouth at bedtime.   Multiple Vitamins-Minerals (MULTIVITAMIN ADULT PO) Take 1 tablet by mouth daily.    [DISCONTINUED] DULoxetine  (CYMBALTA ) 20 MG capsule Take 20 mg by mouth daily.   No facility-administered encounter medications on file as of 03/18/2024.    Review of Systems  Unable to perform ROS: Dementia     Immunization History  Administered Date(s) Administered   INFLUENZA, HIGH DOSE SEASONAL PF 04/05/2017   Influenza-Unspecified 02/04/2020, 02/01/2021, 02/01/2022, 02/08/2023, 01/30/2024   MODERNA COVID-19 SARS-COV-2 PEDS BIVALENT BOOSTER 35yr-14yr 04/29/2019,  05/27/2019, 02/25/2022   Moderna Covid-19 Fall Seasonal Vaccine 99yrs & older 07/26/2022   Moderna SARS-COV2 Booster Vaccination 09/04/2020   Pfizer Covid-19 Vaccine Bivalent Booster 68yrs & up 02/28/2020, 01/08/2021   Pneumococcal Conjugate-13 03/07/2018   Pneumococcal Polysaccharide-23 03/11/2019   Tdap 09/14/2010, 01/02/2024   Unspecified SARS-COV-2 Vaccination 07/28/2023   Pertinent  Health Maintenance Due  Topic Date Due   Influenza Vaccine  Completed   Bone Density Scan  Discontinued      06/10/2022    2:55 PM 11/10/2022   11:48 AM 11/23/2023    2:53 PM  Fall Risk  Falls in the past year? 0 1 1  Was there an injury with Fall? 0 1 1  Fall Risk Category Calculator 0 3 3  Patient at Risk for Falls Due to No Fall Risks History of fall(s);Impaired balance/gait;Impaired mobility History of fall(s);Impaired balance/gait;Impaired mobility  Fall risk Follow up Falls evaluation completed Falls evaluation completed;Education provided Falls evaluation completed   Functional Status Survey:    Vitals:   03/18/24 1442  BP: 130/70  Pulse: 68  Resp: (!) 22  SpO2: 95%  Weight: 147 lb 12.8 oz (67 kg)   Body mass index is 28.87 kg/m. Physical Exam Constitutional:      General: She is not in acute distress.    Appearance: She is well-developed. She is not diaphoretic.  HENT:     Head: Normocephalic and atraumatic.     Mouth/Throat:     Pharynx: No oropharyngeal exudate.  Eyes:     Conjunctiva/sclera: Conjunctivae normal.     Pupils: Pupils are equal, round, and reactive to light.  Cardiovascular:     Rate and Rhythm: Normal rate and regular rhythm.     Heart sounds: Murmur heard.  Pulmonary:     Effort: Pulmonary effort is normal.     Breath sounds: Normal breath sounds.  Abdominal:     General: Bowel sounds are normal.     Palpations: Abdomen is soft.  Musculoskeletal:     Cervical back: Normal range of motion and neck supple.     Right lower leg: No edema.     Left lower  leg: No edema.  Skin:    General: Skin is warm and dry.  Neurological:     Mental Status: She is alert. Mental status is at baseline.     Motor: Weakness present.     Gait: Gait abnormal.  Psychiatric:        Mood and Affect: Mood normal.     Labs reviewed: Recent Labs    04/10/23 0000 09/21/23 0000  NA 137 136*  K 4.0 4.1  CL 6* 103  CO2 27* 26*  BUN 19 17  CREATININE 1.0 0.9  CALCIUM  8.9 9.3   No results for input(s): AST, ALT, ALKPHOS, BILITOT, PROT, ALBUMIN in the last 8760 hours. Recent Labs    04/10/23 0000 05/11/23 0000 09/21/23 0000  WBC 7.0 5.3 8.0  NEUTROABS  --  2,846.00 5,608.00  HGB 12.9 12.1 12.4  HCT 41 38 38  PLT 88* 106* 156   Lab Results  Component Value Date   TSH 1.40 02/20/2023   Lab Results  Component Value Date   HGBA1C 5.8 (H) 04/04/2017   Lab Results  Component Value Date   CHOL 130 02/20/2023   HDL 43 02/20/2023   LDLCALC 63 02/20/2023   TRIG 165 (A) 02/20/2023   CHOLHDL 6.3 04/04/2017    Significant Diagnostic Results in last 30 days:  No results found.  Assessment/Plan Weight loss Noted to have progressive weight loss Will increase remeron to 15 mg by mouth daily at bedtime to help with appetite and mood  Vitamin D  deficiency Vit D level in normal range on current regimen.   Mood disorder Denies anxiety or depression however daughter questions if she is having some seasonal depression Will increase remeron to 15 mg daily due to weight loss and possible worsening depression  Moderate vascular dementia without behavioral disturbance, psychotic disturbance, mood disturbance, or anxiety (HCC) Noted to have more lethargy over the last few weeks.  Recent blood work without significant findings.  Likely due to progression of dementia Will follow up bmp and cbc to ensure no lab abnormalities.    History of stroke with residual deficit Stable, continues on lipitor. Continues with support from staff and family.    Cardiac murmur Pretty significant murmur noted today, question if this causing more fatigue and lethargy vs progression of dementia- likely multifactorial.  Will monitor for signs of fluid overload.   Adult hypothyroidism TSH normal Continues on synthroid  50mcg daily      Kaitlyn Ramos K. Caro BODILY [facility] [phone]

## 2024-03-18 NOTE — Assessment & Plan Note (Signed)
 Stable, continues on lipitor. Continues with support from staff and family.

## 2024-03-18 NOTE — Assessment & Plan Note (Signed)
 TSH normal Continues on synthroid  50mcg daily

## 2024-03-18 NOTE — Assessment & Plan Note (Signed)
 Noted to have more lethargy over the last few weeks.  Recent blood work without significant findings.  Likely due to progression of dementia Will follow up bmp and cbc to ensure no lab abnormalities.

## 2024-03-18 NOTE — Assessment & Plan Note (Signed)
 Pretty significant murmur noted today, question if this causing more fatigue and lethargy vs progression of dementia- likely multifactorial.  Will monitor for signs of fluid overload.

## 2024-03-18 NOTE — Assessment & Plan Note (Signed)
 Denies anxiety or depression however daughter questions if she is having some seasonal depression Will increase remeron to 15 mg daily due to weight loss and possible worsening depression

## 2024-03-21 DIAGNOSIS — I129 Hypertensive chronic kidney disease with stage 1 through stage 4 chronic kidney disease, or unspecified chronic kidney disease: Secondary | ICD-10-CM | POA: Diagnosis not present

## 2024-03-21 LAB — CBC AND DIFFERENTIAL
HCT: 44 (ref 36–46)
Hemoglobin: 13.8 (ref 12.0–16.0)
Neutrophils Absolute: 4716
Platelets: 155 K/uL (ref 150–400)
WBC: 7.3

## 2024-03-21 LAB — CBC: RBC: 4.78 (ref 3.87–5.11)

## 2024-03-21 LAB — BASIC METABOLIC PANEL WITH GFR
BUN: 20 (ref 4–21)
CO2: 27 — AB (ref 13–22)
Chloride: 103 (ref 99–108)
Creatinine: 1.1 (ref 0.5–1.1)
Glucose: 138
Potassium: 3.9 meq/L (ref 3.5–5.1)
Sodium: 141 (ref 137–147)

## 2024-03-21 LAB — COMPREHENSIVE METABOLIC PANEL WITH GFR
Calcium: 9.6 (ref 8.7–10.7)
eGFR: 48

## 2024-05-01 ENCOUNTER — Encounter: Payer: Self-pay | Admitting: Internal Medicine

## 2024-05-01 ENCOUNTER — Non-Acute Institutional Stay (SKILLED_NURSING_FACILITY): Admitting: Internal Medicine

## 2024-05-01 DIAGNOSIS — F01B0 Vascular dementia, moderate, without behavioral disturbance, psychotic disturbance, mood disturbance, and anxiety: Secondary | ICD-10-CM | POA: Diagnosis not present

## 2024-05-01 DIAGNOSIS — I10 Essential (primary) hypertension: Secondary | ICD-10-CM | POA: Diagnosis not present

## 2024-05-01 DIAGNOSIS — R634 Abnormal weight loss: Secondary | ICD-10-CM

## 2024-05-01 DIAGNOSIS — R4701 Aphasia: Secondary | ICD-10-CM

## 2024-05-01 DIAGNOSIS — F39 Unspecified mood [affective] disorder: Secondary | ICD-10-CM | POA: Diagnosis not present

## 2024-05-01 DIAGNOSIS — M150 Primary generalized (osteo)arthritis: Secondary | ICD-10-CM

## 2024-05-01 DIAGNOSIS — E039 Hypothyroidism, unspecified: Secondary | ICD-10-CM

## 2024-05-01 DIAGNOSIS — E559 Vitamin D deficiency, unspecified: Secondary | ICD-10-CM | POA: Diagnosis not present

## 2024-05-01 DIAGNOSIS — I6930 Unspecified sequelae of cerebral infarction: Secondary | ICD-10-CM

## 2024-05-01 DIAGNOSIS — E782 Mixed hyperlipidemia: Secondary | ICD-10-CM | POA: Diagnosis not present

## 2024-05-01 DIAGNOSIS — R011 Cardiac murmur, unspecified: Secondary | ICD-10-CM

## 2024-05-01 NOTE — Assessment & Plan Note (Signed)
Continue with Synthroid 50 mcg daily 

## 2024-05-01 NOTE — Progress Notes (Signed)
 [facility] Routine Visit Progress Note    Location:  Other [place].  Nursing Home Room Number: [facility] [id number] Place of Service:  SNF (31)   [name], DO   Patient Care Team: [name], DO as PCP - General (Internal Medicine)   Extended Emergency Contact Information Primary Emergency Contact: Ramos Ramos Address: [address]          [place], [place] [address] [place] Home Phone: [phone] Relation: Daughter Secondary Emergency Contact: [name]  [place] Home Phone: [phone] Mobile Phone: [phone] Relation: Son   Goals of care: Advanced Directive information    05/01/2024   10:16 AM  Advanced Directives  Does Patient Have a Medical Advance Directive? Yes  Type of Advance Directive Out of facility DNR (pink MOST or yellow form)  Does patient want to make changes to medical advance directive? No - Patient declined    CODE STATUS: Do Not Resuscitate (DNR)   Chief Complaint  Patient presents with   Medical Management of Chronic Issues    Medical Management of Chronic Issues.      HPI: Pt is a 89 y.o. female seen today for medical management of chronic disease.    Ramos Ramos is a 89 year old female who is being seen today at [facility] for routine medical care.  She is a long-term care resident.  Her daughter Ramos Ramos is at the bedside.  Her daughter lives locally.  She also has another son that lives in [facility] [place] .  Daughter states that the patient's son from [place]  did visit during a Christmas holiday.  Patient has been at [facility] since March 2008 but was admitted to [facility] June 2019.  Daughter states the patient has continued to decline.  Both cognitively and physically.  She is wheelchair-bound.  She speaks very little.  She has progressive expressive aphasia.  Daughter states the patient wants to be in her room and not with the other residents.  Daughter  states that she does enjoy watching PBS on TV.  Patient used to be an avid reader but does not read anymore.  Daughter has tried audiobooks along with other books on tape but this has been unsuccessful.  Daughter is aware the patient is slowing down.  Patient has a past medical history of hypertension, osteoarthritis, acquired hypothyroidism, prior history of stroke, moderate vascular dementia without behavioral disturbance, expressive aphasia, moderate depression.  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  Daughter states that she visits with the patient almost every day.    Past Medical History:  Diagnosis Date   Cervicalgia    Depression    Essential hypertension    Hemorrhoids    History of MRSA infection    Hyperlipidemia    Hypertension    Hypothyroidism    IBS (irritable bowel syndrome)    Osteoarthritis of multiple joints    Restless leg    Senile osteoporosis    Shingles    Sleep apnea    Ulcerative colitis (HCC)    Urge incontinence    Past Surgical History:  Procedure Laterality Date   ABDOMINAL HYSTERECTOMY  1980   cataract surgery     CHOLECYSTECTOMY  1955   colon polyp removal  12/2012   ESOPHAGOGASTRODUODENOSCOPY  2014   makoplasty Left 06/2012   parotid gland removal  1984   VISCERAL ANGIOGRAPHY N/A 09/13/2017   Procedure: VISCERAL ANGIOGRAPHY;  Surgeon: Jama,  Cordella MATSU, MD;  Location: [facility] INVASIVE CV LAB;  Service: Cardiovascular;  Laterality: N/A;     Allergies[1]   Outpatient Encounter Medications as of 05/01/2024  Medication Sig   acetaminophen  (TYLENOL ) 500 MG tablet Take 1,000 mg by mouth 2 (two) times daily.   alum & mag hydroxide-simeth (MAALOX/MYLANTA) 200-200-20 MG/5ML suspension Take 30 mLs by mouth every 4 (four) hours as needed for indigestion or heartburn.   atorvastatin  (LIPITOR) 40 MG tablet Take 1 tablet (40 mg total) by mouth daily at 6 PM.   Calcium  Carbonate-Vit D-Min (CALCIUM  600+D PLUS  MINERALS) 600-400 MG-UNIT TABS Take 1 tablet by mouth daily.   Cholecalciferol (D3-50) 50000 units capsule Take 50,000 Units by mouth every 30 (thirty) days.   cyanocobalamin  1000 MCG tablet Take 1,000 mcg by mouth daily.   levothyroxine  (SYNTHROID , LEVOTHROID) 50 MCG tablet Take 50 mcg by mouth daily before breakfast.   losartan  (COZAAR ) 50 MG tablet Take 50 mg by mouth daily.    mirtazapine (REMERON) 15 MG tablet Take 15 mg by mouth at bedtime.   Multiple Vitamins-Minerals (MULTIVITAMIN ADULT PO) Take 1 tablet by mouth daily.    No facility-administered encounter medications on file as of 05/01/2024.     Review of Systems  Unable to perform ROS: Dementia      Immunization History  Administered Date(s) Administered   INFLUENZA, HIGH DOSE SEASONAL PF 04/05/2017   Influenza-Unspecified 02/04/2020, 02/01/2021, 02/01/2022, 02/08/2023, 01/30/2024   MODERNA COVID-19 SARS-COV-2 PEDS BIVALENT BOOSTER 46yr-41yr 04/29/2019, 05/27/2019, 02/25/2022   Moderna Covid-19 Fall Seasonal Vaccine 80yrs & older 07/26/2022   Moderna SARS-COV2 Booster Vaccination 09/04/2020   Pfizer Covid-19 Vaccine Bivalent Booster 79yrs & up 02/28/2020, 01/08/2021   Pneumococcal Conjugate-13 03/07/2018   Pneumococcal Polysaccharide-23 03/11/2019   Tdap 09/14/2010, 01/02/2024   Unspecified SARS-COV-2 Vaccination 07/28/2023   Zoster Recombinant(Shingrix) 02/28/2024   Pertinent  Health Maintenance Due  Topic Date Due   Influenza Vaccine  Completed   Bone Density Scan  Discontinued      06/10/2022    2:55 PM 11/10/2022   11:48 AM 11/23/2023    2:53 PM  Fall Risk  Falls in the past year? 0 1 1  Was there an injury with Fall? 0  1  1   Fall Risk Category Calculator 0 3 3  Patient at Risk for Falls Due to No Fall Risks History of fall(s);Impaired balance/gait;Impaired mobility History of fall(s);Impaired balance/gait;Impaired mobility  Fall risk Follow up Falls evaluation completed Falls evaluation completed;Education  provided Falls evaluation completed     Data saved with a previous flowsheet row definition   Functional Status Survey:     Vitals:   05/01/24 1007  BP: 123/81  Pulse: 77  Resp: 18  Temp: 97.7 F (36.5 C)  SpO2: 95%  Weight: 142 lb 12.8 oz (64.8 kg)  Height: 5' (1.524 m)   Body mass index is 27.89 kg/m. Physical Exam Vitals and nursing note reviewed.  Constitutional:      General: She is not in acute distress.    Appearance: She is not toxic-appearing.     Comments: Appears younger than recorded age of 89 years old.  She is awake.  She is alert.  She is receptive to speech but does have expressive aphasia.  She is able to follow commands.  HENT:     Head: Normocephalic and atraumatic.  Cardiovascular:     Rate and Rhythm: Normal rate and regular rhythm.     Heart sounds: Murmur heard.  Comments: She has a harsh loud systolic ejection murmur left upper sternal border. Pulmonary:     Effort: Pulmonary effort is normal. No respiratory distress.  Abdominal:     General: Abdomen is flat. Bowel sounds are normal.  Musculoskeletal:     Right lower leg: No edema.     Left lower leg: No edema.  Skin:    General: Skin is warm and dry.     Capillary Refill: Capillary refill takes less than 2 seconds.  Neurological:     Comments: Expressive aphasia.  Able to answer a few questions.  Speech is quite slow.      Labs reviewed: Recent Labs    09/21/23 0000 02/19/24 0000 03/21/24 0000  NA 136* 138 141  K 4.1 4.2 3.9  CL 103 102 103  CO2 26* 29* 27*  BUN 17 16 20   CREATININE 0.9 0.9 1.1  CALCIUM  9.3 9.0 9.6   Recent Labs    02/19/24 0000  AST 14  ALT 7  ALKPHOS 47  ALBUMIN 3.8   Recent Labs    09/21/23 0000 02/19/24 0000 03/21/24 0000  WBC 8.0 6.0 7.3  NEUTROABS 5,608.00 4,080.00 4,716.00  HGB 12.4 12.8 13.8  HCT 38 40 44  PLT 156 147* 155   Lab Results  Component Value Date   TSH 1.49 02/19/2024   Lab Results  Component Value Date   HGBA1C 5.8  (H) 04/04/2017   Lab Results  Component Value Date   CHOL 122 02/19/2024   HDL 39 02/19/2024   LDLCALC 55 02/19/2024   TRIG 224 (A) 02/19/2024   CHOLHDL 6.3 04/04/2017    Assessment/Plan Weight loss Weight was 147.8 pounds on March 04, 2024.  Decreased to 146.2 pounds on April 03, 2024.  Weight is down to 142.8 pounds on April 24, 2024.  She remains on Remeron 15 mg nightly to help with appetite stimulation.  Primary hypertension She remains on losartan  50 mg daily  Moderate vascular dementia without behavioral disturbance, psychotic disturbance, mood disturbance, or anxiety (HCC) Chronic.  She is not on any antipsychotics.  Combined hyperlipidemia Stable.  Continue Lipitor 40 mg daily.  Osteoarthritis of multiple joints Stable.  Continue with Tylenol  1000 mg twice daily scheduled  Adult hypothyroidism Continue with Synthroid  50 mcg daily  Expressive aphasia Chronic.  Continues with expressive aphasia.  Secondary to prior stroke.  History of stroke with residual deficit Chronic.  Has expressive aphasia.  Cardiac murmur Patient has a loud harsh systolic ejection murmur at the left upper sternal border.  Likely aortic stenosis.  At her advanced age of 89 years old, she is not a candidate for surgical AVR or TAVR.  Mood disorder Continue Remeron 50 mg for both appetite stimulation for her weight loss and major depression.  Vitamin D  deficiency Continue with vitamin D    There are no discontinued medications. Orders Placed This Encounter  Procedures   CBC and differential    This external order was created through the Results Console.   CBC    This external order was created through the Results Console.   VITAMIN D  25 Hydroxy (Vit-D Deficiency, Fractures)    This external order was created through the Results Console.   Basic metabolic panel with GFR    This external order was created through the Results Console.   Comprehensive metabolic panel with GFR     This external order was created through the Results Console.   Lipid panel    This external order was  created through the Results Console.   Hepatic function panel    This external order was created through the Results Console.   TSH    This external order was created through the Results Console.   CBC and differential    This external order was created through the Results Console.   CBC    This external order was created through the Results Console.   Basic metabolic panel with GFR    This external order was created through the Results Console.   Comprehensive metabolic panel with GFR    This external order was created through the Results Console.    Camellia Door, DO  [facility] [phone]      [1]  Allergies Allergen Reactions   Codeine Sulfate Hives and Itching    Can take tramadol   Codeine Other (See Comments)

## 2024-05-01 NOTE — Assessment & Plan Note (Signed)
 Continue with vitamin D

## 2024-05-01 NOTE — Assessment & Plan Note (Signed)
 Stable.  Continue with Tylenol  1000 mg twice daily scheduled

## 2024-05-01 NOTE — Assessment & Plan Note (Signed)
 She remains on losartan  50 mg daily

## 2024-05-01 NOTE — Assessment & Plan Note (Signed)
 Patient has a loud harsh systolic ejection murmur at the left upper sternal border.  Likely aortic stenosis.  At her advanced age of 89 years old, she is not a candidate for surgical AVR or TAVR.

## 2024-05-01 NOTE — Assessment & Plan Note (Signed)
 Chronic.  She is not on any antipsychotics.

## 2024-05-01 NOTE — Assessment & Plan Note (Signed)
 Chronic.  Continues with expressive aphasia.  Secondary to prior stroke.

## 2024-05-01 NOTE — Assessment & Plan Note (Signed)
 Chronic.  Has expressive aphasia.

## 2024-05-01 NOTE — Assessment & Plan Note (Signed)
 Stable. Continue Lipitor 40 mg daily.

## 2024-05-01 NOTE — Assessment & Plan Note (Signed)
 Weight was 147.8 pounds on March 04, 2024.  Decreased to 146.2 pounds on April 03, 2024.  Weight is down to 142.8 pounds on April 24, 2024.  She remains on Remeron 15 mg nightly to help with appetite stimulation.

## 2024-05-01 NOTE — Assessment & Plan Note (Signed)
 Continue Remeron 50 mg for both appetite stimulation for her weight loss and major depression.

## 2024-05-21 ENCOUNTER — Encounter: Payer: Self-pay | Admitting: Nurse Practitioner

## 2024-05-21 ENCOUNTER — Non-Acute Institutional Stay (SKILLED_NURSING_FACILITY): Payer: Self-pay | Admitting: Nurse Practitioner

## 2024-05-21 DIAGNOSIS — F01B0 Vascular dementia, moderate, without behavioral disturbance, psychotic disturbance, mood disturbance, and anxiety: Secondary | ICD-10-CM | POA: Diagnosis not present

## 2024-05-21 DIAGNOSIS — M150 Primary generalized (osteo)arthritis: Secondary | ICD-10-CM | POA: Diagnosis not present

## 2024-05-21 DIAGNOSIS — E559 Vitamin D deficiency, unspecified: Secondary | ICD-10-CM | POA: Diagnosis not present

## 2024-05-21 DIAGNOSIS — E039 Hypothyroidism, unspecified: Secondary | ICD-10-CM

## 2024-05-21 DIAGNOSIS — I10 Essential (primary) hypertension: Secondary | ICD-10-CM

## 2024-05-21 DIAGNOSIS — R634 Abnormal weight loss: Secondary | ICD-10-CM

## 2024-05-21 DIAGNOSIS — F39 Unspecified mood [affective] disorder: Secondary | ICD-10-CM

## 2024-05-21 DIAGNOSIS — R4701 Aphasia: Secondary | ICD-10-CM | POA: Diagnosis not present

## 2024-05-21 DIAGNOSIS — I6930 Unspecified sequelae of cerebral infarction: Secondary | ICD-10-CM

## 2024-05-21 DIAGNOSIS — R1310 Dysphagia, unspecified: Secondary | ICD-10-CM | POA: Diagnosis not present

## 2024-05-22 NOTE — Assessment & Plan Note (Signed)
 Ongoing and chronic, more trouble with swallowing which could be results of dementia or CVA- ST consult has been placed.  Will continue to monitor for signs of aspiration.

## 2024-05-22 NOTE — Assessment & Plan Note (Signed)
 Ongoing, Weight was 147.8 pounds on March 04, 2024.  Decreased to 146.2 pounds on April 03, 2024.  Weight is down to 142.8 pounds on April 24, 2024.  She remains on Remeron 15 mg nightly to help with appetite stimulation however continues with decline in oral intake. Likely due to progression of dementia, expect to worsen.

## 2024-05-22 NOTE — Assessment & Plan Note (Signed)
 Stable, Continue Remeron 15 mg for both appetite stimulation for her weight loss and major depression.

## 2024-05-22 NOTE — Assessment & Plan Note (Signed)
 Continue with Synthroid  50 mcg daily, will follow up TSH due to decline to ensure level stable.

## 2024-05-22 NOTE — Assessment & Plan Note (Signed)
Continue with vitamin-D supplement.

## 2024-05-22 NOTE — Assessment & Plan Note (Signed)
 Controlled, continues on losartan  50 mg daily

## 2024-05-22 NOTE — Assessment & Plan Note (Signed)
 Overal decline the last few months with weight loss needing more assistance and care with ADL and becoming dependant on staff.  Spoke with daughter and goal is comfort with get hospice involved at this time.

## 2024-05-22 NOTE — Assessment & Plan Note (Signed)
 Stable, no signs or reports of worsening pains, continue with Tylenol  1000 mg twice daily scheduled

## 2024-05-22 NOTE — Assessment & Plan Note (Signed)
 Chronic.  Continues with expressive aphasia.  Secondary to prior stroke.
